# Patient Record
Sex: Female | Born: 1980 | Race: Black or African American | Hispanic: No | Marital: Single | Smoking: Former smoker
Health system: Southern US, Community
[De-identification: ages and names within clinical notes are randomized; demographics above are authoritative.]

## PROBLEM LIST (undated history)

## (undated) DIAGNOSIS — Z9889 Other specified postprocedural states: Secondary | ICD-10-CM

## (undated) DIAGNOSIS — A64 Unspecified sexually transmitted disease: Secondary | ICD-10-CM

## (undated) DIAGNOSIS — N62 Hypertrophy of breast: Secondary | ICD-10-CM

## (undated) DIAGNOSIS — I10 Essential (primary) hypertension: Secondary | ICD-10-CM

## (undated) DIAGNOSIS — R112 Nausea with vomiting, unspecified: Secondary | ICD-10-CM

## (undated) HISTORY — DX: Unspecified sexually transmitted disease: A64

## (undated) HISTORY — DX: Essential (primary) hypertension: I10

---

## 2000-06-20 DIAGNOSIS — A64 Unspecified sexually transmitted disease: Secondary | ICD-10-CM

## 2000-06-20 HISTORY — DX: Unspecified sexually transmitted disease: A64

## 2002-06-05 ENCOUNTER — Other Ambulatory Visit: Admission: RE | Admit: 2002-06-05 | Discharge: 2002-06-05 | Payer: Self-pay | Admitting: Gynecology

## 2003-07-02 ENCOUNTER — Other Ambulatory Visit: Admission: RE | Admit: 2003-07-02 | Discharge: 2003-07-02 | Payer: Self-pay | Admitting: Gynecology

## 2004-07-14 ENCOUNTER — Other Ambulatory Visit: Admission: RE | Admit: 2004-07-14 | Discharge: 2004-07-14 | Payer: Self-pay | Admitting: Gynecology

## 2005-07-15 ENCOUNTER — Other Ambulatory Visit: Admission: RE | Admit: 2005-07-15 | Discharge: 2005-07-15 | Payer: Self-pay | Admitting: Gynecology

## 2006-02-04 ENCOUNTER — Other Ambulatory Visit: Admission: RE | Admit: 2006-02-04 | Discharge: 2006-02-04 | Payer: Self-pay | Admitting: Obstetrics and Gynecology

## 2006-02-23 ENCOUNTER — Inpatient Hospital Stay (HOSPITAL_COMMUNITY): Admission: AD | Admit: 2006-02-23 | Discharge: 2006-02-24 | Payer: Self-pay | Admitting: Obstetrics and Gynecology

## 2006-07-16 ENCOUNTER — Inpatient Hospital Stay (HOSPITAL_COMMUNITY): Admission: AD | Admit: 2006-07-16 | Discharge: 2006-07-16 | Payer: Self-pay | Admitting: Obstetrics and Gynecology

## 2006-07-21 ENCOUNTER — Inpatient Hospital Stay (HOSPITAL_COMMUNITY): Admission: AD | Admit: 2006-07-21 | Discharge: 2006-07-21 | Payer: Self-pay | Admitting: Obstetrics and Gynecology

## 2006-07-28 ENCOUNTER — Inpatient Hospital Stay (HOSPITAL_COMMUNITY): Admission: AD | Admit: 2006-07-28 | Discharge: 2006-07-28 | Payer: Self-pay | Admitting: Obstetrics and Gynecology

## 2006-09-24 ENCOUNTER — Inpatient Hospital Stay (HOSPITAL_COMMUNITY): Admission: AD | Admit: 2006-09-24 | Discharge: 2006-09-28 | Payer: Self-pay | Admitting: Obstetrics and Gynecology

## 2006-09-25 ENCOUNTER — Encounter (INDEPENDENT_AMBULATORY_CARE_PROVIDER_SITE_OTHER): Payer: Self-pay | Admitting: *Deleted

## 2006-11-06 ENCOUNTER — Other Ambulatory Visit: Admission: RE | Admit: 2006-11-06 | Discharge: 2006-11-06 | Payer: Self-pay | Admitting: Obstetrics and Gynecology

## 2006-11-16 ENCOUNTER — Inpatient Hospital Stay (HOSPITAL_COMMUNITY): Admission: AD | Admit: 2006-11-16 | Discharge: 2006-11-16 | Payer: Self-pay | Admitting: Obstetrics and Gynecology

## 2007-02-05 ENCOUNTER — Inpatient Hospital Stay (HOSPITAL_COMMUNITY): Admission: AD | Admit: 2007-02-05 | Discharge: 2007-02-05 | Payer: Self-pay | Admitting: Obstetrics and Gynecology

## 2007-04-27 ENCOUNTER — Inpatient Hospital Stay (HOSPITAL_COMMUNITY): Admission: AD | Admit: 2007-04-27 | Discharge: 2007-04-27 | Payer: Self-pay | Admitting: Obstetrics and Gynecology

## 2007-05-29 ENCOUNTER — Ambulatory Visit: Payer: Self-pay | Admitting: Gastroenterology

## 2007-05-30 ENCOUNTER — Encounter: Admission: RE | Admit: 2007-05-30 | Discharge: 2007-05-30 | Payer: Self-pay | Admitting: Gastroenterology

## 2007-05-31 ENCOUNTER — Ambulatory Visit: Payer: Self-pay | Admitting: Gastroenterology

## 2007-05-31 DIAGNOSIS — K209 Esophagitis, unspecified without bleeding: Secondary | ICD-10-CM | POA: Insufficient documentation

## 2007-06-20 DIAGNOSIS — R109 Unspecified abdominal pain: Secondary | ICD-10-CM | POA: Insufficient documentation

## 2007-06-21 HISTORY — PX: CHOLECYSTECTOMY: SHX55

## 2007-07-27 ENCOUNTER — Inpatient Hospital Stay (HOSPITAL_COMMUNITY): Admission: AD | Admit: 2007-07-27 | Discharge: 2007-07-27 | Payer: Self-pay | Admitting: Obstetrics and Gynecology

## 2008-12-10 ENCOUNTER — Other Ambulatory Visit: Admission: RE | Admit: 2008-12-10 | Discharge: 2008-12-10 | Payer: Self-pay | Admitting: Gynecology

## 2008-12-10 ENCOUNTER — Ambulatory Visit: Payer: Self-pay | Admitting: Women's Health

## 2008-12-10 ENCOUNTER — Encounter: Payer: Self-pay | Admitting: Women's Health

## 2009-02-13 ENCOUNTER — Ambulatory Visit: Payer: Self-pay | Admitting: Obstetrics and Gynecology

## 2009-05-04 ENCOUNTER — Ambulatory Visit: Payer: Self-pay | Admitting: Women's Health

## 2009-07-31 ENCOUNTER — Ambulatory Visit: Payer: Self-pay | Admitting: Women's Health

## 2009-11-06 ENCOUNTER — Ambulatory Visit: Payer: Self-pay | Admitting: Women's Health

## 2010-06-20 HISTORY — PX: REDUCTION MAMMAPLASTY: SUR839

## 2010-09-17 ENCOUNTER — Encounter (INDEPENDENT_AMBULATORY_CARE_PROVIDER_SITE_OTHER): Payer: BC Managed Care – PPO | Admitting: Women's Health

## 2010-09-17 ENCOUNTER — Other Ambulatory Visit (HOSPITAL_COMMUNITY)
Admission: RE | Admit: 2010-09-17 | Discharge: 2010-09-17 | Disposition: A | Discharge status: Home / Self Care | Payer: BC Managed Care – PPO | Source: Ambulatory Visit | Attending: Gynecology | Admitting: Gynecology

## 2010-09-17 ENCOUNTER — Other Ambulatory Visit: Payer: Self-pay | Admitting: Women's Health

## 2010-09-17 DIAGNOSIS — R82998 Other abnormal findings in urine: Secondary | ICD-10-CM

## 2010-09-17 DIAGNOSIS — Z124 Encounter for screening for malignant neoplasm of cervix: Secondary | ICD-10-CM | POA: Insufficient documentation

## 2010-09-17 DIAGNOSIS — R635 Abnormal weight gain: Secondary | ICD-10-CM

## 2010-09-17 DIAGNOSIS — Z113 Encounter for screening for infections with a predominantly sexual mode of transmission: Secondary | ICD-10-CM

## 2010-09-17 DIAGNOSIS — Z01419 Encounter for gynecological examination (general) (routine) without abnormal findings: Secondary | ICD-10-CM

## 2010-09-17 DIAGNOSIS — B373 Candidiasis of vulva and vagina: Secondary | ICD-10-CM

## 2010-11-02 NOTE — Discharge Summary (Signed)
NAME:  Kelsey Cameron, BULGER NO.:  [id number]   MEDICAL RECORD NO.:  [id number]          PATIENT TYPE:  INP   LOCATION:  9125                          FACILITY:  [facility]   PHYSICIAN:  Rudy Jew. Ashley Royalty, M.D.DATE OF BIRTH:  [birth year]   DATE OF ADMISSION:  09/24/2006  DATE OF DISCHARGE:  09/28/2006                               DISCHARGE SUMMARY   DISCHARGE DIAGNOSES:  1. Intrauterine pregnancy at term, delivered.  2. Group B streptococcus carrier.  3. Meconium-stained amniotic fluid.  4. Arrest disorder of dilatation in labor.   OPERATIONS AND SPECIAL PROCEDURES:  Primary low transverse cesarean  section.   CONSULTATIONS:  None.   DISCHARGE MEDICATIONS:  1. Motrin 600 mg q.i.d.  2. Chromagen one daily.   HISTORY AND PHYSICAL:  This is a 25-year gravida 2, para 0, AB 1.  The  patient had the aforementioned risk factors.  She presented complaining  of possible rupture of membranes.  She was also noted to be having  contractions in maternity admissions unit.  For the remainder of the  past medical history please see [facility] forms.  Sterile speculum exam  revealed no evidence of ruptured membranes.  She was noted to be 1-2 cm  dilated, 80% effaced, -2 station, vertex presentation.  She was noted to  be having contractions.  For the remainder of the history and physical  please see chart.   HOSPITAL COURSE:  The patient was admitted to [facility]  [place].  Admission laboratory studies were drawn.  She went on to  labor.  On September 25, 2006, she was diagnosed as having an arrest disorder  of dilatation in labor.  She was hence taken to the operating room and  underwent primary low transverse cesarean section per Dr. Sylvester Harder.  The procedure was uncomplicated.  It yielded a 6-pound 15-  ounce female, Apgars 9 at one minute and 9 at five minutes.  The  patient's postoperative course was benign save for an asymptomatic  anemia.  She was discharged on  the third postoperative day afebrile and  in satisfactory condition.   DISPOSITION:  The patient is return to [facility] in 4 days.      James A. Ashley Royalty, M.D.  Electronically Signed     JAM/MEDQ  D:  11/01/2006  T:  11/01/2006  Job:  [id number]

## 2010-11-02 NOTE — Letter (Signed)
May 29, 2007    Dr. Charolett Bumpers  [facility]  [address]  [place], [place] [address]   RE:  Kelsey Cameron, Kelsey Cameron  MRN:  [id number]  /  DOB:  Jun 16, 1981   Dear Dr. Cleda Clarks:   Upon your kind referral, I had the pleasure of evaluating your patient  and I am pleased to offer my findings.  I saw Ms. Tatem in the office  today.  Enclosed is a copy of my progress note that details my findings  and recommendations.   Thank you for the opportunity to participate in your patient's care.    Sincerely,      Barbette Hair. Arlyce Dice, MD,FACG  Electronically Signed    RDK/MedQ  DD: 05/29/2007  DT: 05/29/2007  Job #: [id number]

## 2010-11-02 NOTE — Letter (Signed)
May 29, 2007    Kelsey Cameron   RE:  Kelsey Cameron, Kelsey Cameron  MRN:  [id number]  /  DOB:  June 12, 1981   Dear Ms. Kelsey Cameron:   It is my pleasure to have treated you recently as a new patient in my  office.  I appreciate your confidence and the opportunity to participate  in your care.   Since I do have a busy inpatient endoscopy schedule and office schedule,  my office hours vary weekly.  I am, however, available for emergency  calls every day through my office.  If I cannot promptly meet an urgent  office appointment, another one of our gastroenterologists will be able  to assist you.   My well-trained staff are prepared to help you at all times.  For  emergencies after office hours, a physician from our gastroenterology  section is always available through my 24-hour answering service.   While you are under my care, I encourage discussion of your questions  and concerns, and I will be happy to return your calls as soon as I am  available.   Once again, I welcome you as a new patient and I look forward to a happy  and healthy relationship.    Sincerely,      Barbette Hair. Arlyce Dice, MD,FACG  Electronically Signed   RDK/MedQ  DD: 05/29/2007  DT: 05/29/2007  Job #: [id number]

## 2010-11-02 NOTE — Assessment & Plan Note (Signed)
[facility]                         GASTROENTEROLOGY OFFICE NOTE   Kelsey Cameron, Kelsey Cameron                  MRN:          [id number]  DATE:05/29/2007                            DOB:          Dec 26, 1980    REASON FOR CONSULTATION:  Abdominal pain and chest burning.   HISTORY OF PRESENT ILLNESS:  Kelsey Cameron is a pleasant 30 year old  African American female who presents through the courtesy of Dr.  Cleda Clarks  for evaluation.  For the last four months, she has been complaining of  severe burning chest discomfort.  This often occurs postprandially.  She  has had regurgitation, gastric contents.  She also complains of pain and  pressure under her right ribs coincidental with the chest discomfort.  She is taking various PPIs without relief.  She is currently taking  Protonix.  She has had no gastric irritants including nonsteroidals.  She denies dysphagia or odynophagia.  Symptoms began after the birth of  her child in April 2008.  She denies cough, sore throat or hoarseness.   PAST MEDICAL HISTORY:  Unremarkable.   FAMILY HISTORY:  Noncontributory.   MEDICATIONS:  Protonix 40 mg daily.   ALLERGIES:  She has no allergies.   SOCIAL HISTORY:  She does not smoke.  She rarely drinks.  She is single.  Works in Clinical biochemist.   REVIEW OF SYSTEMS:  Positive for back pain and shortness of breath.   PHYSICAL EXAMINATION:  VITAL SIGNS:  Pulse 68, blood pressure 118/88,  weight 154.  HEENT: EOMI.  PERRLA.  Sclerae are anicteric.  Conjunctivae are pink.  NECK:  Supple without thyromegaly, adenopathy or carotid bruits.  CHEST:  Clear to auscultation and percussion without adventitious  sounds.  CARDIAC:  Regular rhythm; normal S1 S2.  There are no murmurs, gallops  or rubs.  ABDOMEN:  She has mild tenderness in the mid epigastrium without  guarding or rebound.  There are no abdominal masses or organomegaly.  EXTREMITIES:  Full range of motion.  No  cyanosis, clubbing or edema.  RECTAL:  Deferred.   IMPRESSION:  Persistent chest burning with regurgitation and upper  abdominal pain.  Symptoms certainly compatible with gastroesophageal  reflux, though atypical in the absence of response to PPR therapy.  Chronic cholecystitis is also a consideration.   RECOMMENDATION:  1. Abdominal ultrasound.  2. The patient will consider enrollment in a gastroesophageal reflux      disease trial.  3. Antireflux measures were explained to the patient.     Barbette Hair. Arlyce Dice, MD,FACG  Electronically Signed    RDK/MedQ  DD: 05/29/2007  DT: 05/29/2007  Job #: [id number]   cc:   [facility]pt [facility] Dr .Charolett Bumpers

## 2010-11-05 NOTE — Op Note (Signed)
NAME:  Kelsey, Cameron NO.:  [id number]   MEDICAL RECORD NO.:  [id number]          PATIENT TYPE:  INP   LOCATION:  9125                          [facility]ILITY:  [facility]   PHYSICIAN:  Rudy Jew. Ashley Royalty, M.D.DATE OF BIRTH:  Sep 10, 1980   DATE OF PROCEDURE:  09/25/2006  DATE OF DISCHARGE:                               OPERATIVE REPORT   PREOPERATIVE DIAGNOSIS:  1. Intrauterine pregnancy at 40 weeks 5 days gestation.  2. Meconium-stained amniotic fluid.  3. Arrest disorder of dilatation in labor.   POSTOPERATIVE DIAGNOSIS:  1. Intrauterine pregnancy at 40 weeks 5 days gestation.  2. Meconium-stained amniotic fluid.  3. Arrest disorder of dilatation in labor.   PROCEDURE:  Primary low transverse cesarean section.   SURGEON:  Rudy Jew. Ashley Royalty, M.D.   ANESTHESIA:  Epidural.   FINDINGS:  6 pounds 15 ounce female, Apgars 9 at one minute and 9 at  five minutes sent to newborn nursery.   ESTIMATED BLOOD LOSS:  800 mL.   COMPLICATIONS:  None.   PACKS DRAINS:  Foley.   Sponge, needle, and instrument count reported as correct x2.   PROCEDURE:  The patient is taken to the operating room, placed in the  dorsal supine position.  Epidural anesthetic was dosed to a surgical  level.  She was prepped and draped in usual manner for abdominal  surgery.  Foley catheter had been previously placed.   A Pfannenstiel incision was made down to level of the fascia which was  nicked with a knife and incised transversely with Mayo scissors.  The  underlying rectus muscles were separated from the fascia using sharp and  blunt dissection.  The rectus muscles were separated in midline exposing  the peritoneum which was elevated with hemostats and entered  atraumatically with Metzenbaum scissors.  The incision was extended  longitudinally.  Uterus was identified.  A bladder flap created by  incising anterior uterine serosa and sharply and bluntly dissecting the  bladder inferiorly.  It was  held in place with a bladder blade.  The  uterus was then entered through a low transverse incision using sharp  and blunt dissection.  The fluid was noted to be meconium stained.  The  infant was delivered from vertex presentation.  At delivery of the head,  DeLee suction was employed.  Full delivery was then accomplished and the  cord clamped, cut and the infant given immediately to the awaiting  pediatrics team.  Cord blood was obtained and placenta and membranes  were removed in their entirety and submitted to pathology for histologic  studies.  Uterus was exteriorized.  The uterus was then closed in two  running layers of #1 Vicryl.  The first was a running locking layer.  The second was a running, intermittently locking, and imbricating layer.  One additional figure-of-eight suture was required on the left side of  the incision to obtain hemostasis.  Hemostasis was noted.  Uterus, tubes  and ovaries were inspected and found to be normal save for a 1 cm  fibroid on the anterior surface fundally.  There were returned to the  abdominal cavity.  Copious irrigation was accomplished.  Hemostasis was  noted.  The peritoneum was then closed with 3-0 Vicryl in a running  fashion.  The fascia was closed 0 Vicryl in a running fashion.  The skin  was closed with staples.   The patient tolerated the procedure extremely well and was returned to  the recovery room in good condition.      James A. Ashley Royalty, M.D.  Electronically Signed     JAM/MEDQ  D:  09/25/2006  T:  09/25/2006  Job:  [id number]

## 2011-05-10 ENCOUNTER — Encounter (HOSPITAL_BASED_OUTPATIENT_CLINIC_OR_DEPARTMENT_OTHER): Payer: Self-pay | Admitting: *Deleted

## 2011-05-17 ENCOUNTER — Encounter (HOSPITAL_BASED_OUTPATIENT_CLINIC_OR_DEPARTMENT_OTHER)
Admission: RE | Disposition: A | Discharge status: Home / Self Care | Payer: Self-pay | Source: Ambulatory Visit | Attending: Plastic Surgery

## 2011-05-17 ENCOUNTER — Encounter (HOSPITAL_BASED_OUTPATIENT_CLINIC_OR_DEPARTMENT_OTHER): Payer: Self-pay | Admitting: Anesthesiology

## 2011-05-17 ENCOUNTER — Ambulatory Visit (HOSPITAL_BASED_OUTPATIENT_CLINIC_OR_DEPARTMENT_OTHER)
Admission: RE | Admit: 2011-05-17 | Discharge: 2011-05-17 | Disposition: A | Discharge status: Home / Self Care | Payer: BC Managed Care – PPO | Source: Ambulatory Visit | Attending: Plastic Surgery | Admitting: Plastic Surgery

## 2011-05-17 ENCOUNTER — Encounter (HOSPITAL_BASED_OUTPATIENT_CLINIC_OR_DEPARTMENT_OTHER): Payer: Self-pay | Admitting: *Deleted

## 2011-05-17 ENCOUNTER — Ambulatory Visit (HOSPITAL_BASED_OUTPATIENT_CLINIC_OR_DEPARTMENT_OTHER): Payer: BC Managed Care – PPO | Admitting: Anesthesiology

## 2011-05-17 ENCOUNTER — Other Ambulatory Visit: Payer: Self-pay | Admitting: Plastic Surgery

## 2011-05-17 DIAGNOSIS — N62 Hypertrophy of breast: Secondary | ICD-10-CM | POA: Diagnosis present

## 2011-05-17 HISTORY — PX: BREAST REDUCTION SURGERY: SHX8

## 2011-05-17 HISTORY — DX: Other specified postprocedural states: Z98.890

## 2011-05-17 HISTORY — DX: Hypertrophy of breast: N62

## 2011-05-17 HISTORY — DX: Other specified postprocedural states: R11.2

## 2011-05-17 SURGERY — MAMMOPLASTY, REDUCTION
Anesthesia: General | Site: Breast | Laterality: Bilateral | Wound class: Clean

## 2011-05-17 MED ORDER — CEFAZOLIN SODIUM 1-5 GM-% IV SOLN
INTRAVENOUS | Status: DC | PRN
  Administered 2011-05-17: 2 g via INTRAVENOUS

## 2011-05-17 MED ORDER — PROMETHAZINE HCL 25 MG PO TABS
25.0000 mg | ORAL_TABLET | Freq: Once | ORAL | Status: AC
  Administered 2011-05-17: 25 mg via ORAL

## 2011-05-17 MED ORDER — BUPIVACAINE-EPINEPHRINE 0.5% -1:200000 IJ SOLN
INTRAMUSCULAR | Status: DC | PRN
  Administered 2011-05-17 (×2): 20 mL

## 2011-05-17 MED ORDER — FENTANYL CITRATE 0.05 MG/ML IJ SOLN
INTRAMUSCULAR | Status: DC | PRN
  Administered 2011-05-17 (×4): 100 ug via INTRAVENOUS

## 2011-05-17 MED ORDER — LIDOCAINE-EPINEPHRINE 1 %-1:100000 IJ SOLN
INTRAMUSCULAR | Status: DC | PRN
  Administered 2011-05-17: 40 mL

## 2011-05-17 MED ORDER — MEPERIDINE HCL 25 MG/ML IJ SOLN: 6.2500 mg | INTRAMUSCULAR | Status: DC | PRN

## 2011-05-17 MED ORDER — DEXAMETHASONE SODIUM PHOSPHATE 4 MG/ML IJ SOLN
INTRAMUSCULAR | Status: DC | PRN
  Administered 2011-05-17: 10 mg via INTRAVENOUS

## 2011-05-17 MED ORDER — CEFAZOLIN SODIUM 1-5 GM-% IV SOLN: 1.0000 g | Freq: Once | INTRAVENOUS | Status: DC

## 2011-05-17 MED ORDER — CISATRACURIUM BESYLATE 2 MG/ML IV SOLN
INTRAVENOUS | Status: DC | PRN
  Administered 2011-05-17: 14 mg via INTRAVENOUS

## 2011-05-17 MED ORDER — LACTATED RINGERS IV SOLN
INTRAVENOUS | Status: DC
  Administered 2011-05-17 (×4): via INTRAVENOUS

## 2011-05-17 MED ORDER — ONDANSETRON HCL 4 MG/2ML IJ SOLN
4.0000 mg | Freq: Once | INTRAMUSCULAR | Status: AC | PRN
  Administered 2011-05-17: 4 mg via INTRAVENOUS

## 2011-05-17 MED ORDER — HYDROMORPHONE HCL PF 1 MG/ML IJ SOLN: 0.2500 mg | INTRAMUSCULAR | Status: DC | PRN

## 2011-05-17 MED ORDER — LIDOCAINE HCL (CARDIAC) 20 MG/ML IV SOLN
INTRAVENOUS | Status: DC | PRN
  Administered 2011-05-17: 100 mg via INTRAVENOUS

## 2011-05-17 MED ORDER — DROPERIDOL 2.5 MG/ML IJ SOLN
INTRAMUSCULAR | Status: DC | PRN
  Administered 2011-05-17: 0.625 mg via INTRAVENOUS

## 2011-05-17 MED ORDER — BACITRACIN ZINC 500 UNIT/GM EX OINT
TOPICAL_OINTMENT | CUTANEOUS | Status: DC | PRN
  Administered 2011-05-17: 2 via TOPICAL

## 2011-05-17 MED ORDER — PROPOFOL 10 MG/ML IV EMUL
INTRAVENOUS | Status: DC | PRN
  Administered 2011-05-17: 140 mg via INTRAVENOUS
  Administered 2011-05-17: 50 mg via INTRAVENOUS

## 2011-05-17 MED ORDER — MIDAZOLAM HCL 5 MG/5ML IJ SOLN
INTRAMUSCULAR | Status: DC | PRN
  Administered 2011-05-17: 2 mg via INTRAVENOUS

## 2011-05-17 MED ORDER — ONDANSETRON HCL 4 MG/2ML IJ SOLN
INTRAMUSCULAR | Status: DC | PRN
  Administered 2011-05-17: 4 mg via INTRAVENOUS

## 2011-05-17 SURGICAL SUPPLY — 56 items
BANDAGE GAUZE ELAST BULKY 4 IN (GAUZE/BANDAGES/DRESSINGS) ×4 IMPLANT
BENZOIN TINCTURE PRP APPL 2/3 (GAUZE/BANDAGES/DRESSINGS) ×2 IMPLANT
BLADE KNIFE PERSONA 10 (BLADE) ×8 IMPLANT
BLADE KNIFE PERSONA 15 (BLADE) ×6 IMPLANT
CANISTER SUCTION 1200CC (MISCELLANEOUS) ×4 IMPLANT
CAP BOUFFANT 24 BLUE NURSES (PROTECTIVE WEAR) ×2 IMPLANT
CLOTH BEACON ORANGE TIMEOUT ST (SAFETY) ×2 IMPLANT
COVER MAYO STAND STRL (DRAPES) ×2 IMPLANT
COVER TABLE BACK 60X90 (DRAPES) ×2 IMPLANT
DECANTER SPIKE VIAL GLASS SM (MISCELLANEOUS) ×4 IMPLANT
DRAIN CHANNEL 10F 3/8 F FF (DRAIN) ×4 IMPLANT
DRAPE LAPAROSCOPIC ABDOMINAL (DRAPES) ×2 IMPLANT
DRSG EMULSION OIL 3X3 NADH (GAUZE/BANDAGES/DRESSINGS) ×4 IMPLANT
DRSG PAD ABDOMINAL 8X10 ST (GAUZE/BANDAGES/DRESSINGS) ×2 IMPLANT
ELECT NEEDLE TIP 2.8 STRL (NEEDLE) IMPLANT
ELECT REM PT RETURN 9FT ADLT (ELECTROSURGICAL) ×2
ELECTRODE REM PT RTRN 9FT ADLT (ELECTROSURGICAL) ×1 IMPLANT
EVACUATOR SILICONE 100CC (DRAIN) ×4 IMPLANT
FILTER 7/8 IN (FILTER) ×2 IMPLANT
GLOVE BIO SURGEON STRL SZ 6.5 (GLOVE) ×2 IMPLANT
GLOVE BIOGEL PI IND STRL 6.5 (GLOVE) IMPLANT
GLOVE BIOGEL PI INDICATOR 6.5 (GLOVE)
GLOVE ECLIPSE 6.5 STRL STRAW (GLOVE) ×12 IMPLANT
GOWN PREVENTION PLUS XLARGE (GOWN DISPOSABLE) ×8 IMPLANT
GOWN PREVENTION PLUS XXLARGE (GOWN DISPOSABLE) IMPLANT
NEEDLE HYPO 25X1 1.5 SAFETY (NEEDLE) ×4 IMPLANT
NEEDLE SPNL 18GX3.5 QUINCKE PK (NEEDLE) ×2 IMPLANT
NS IRRIG 1000ML POUR BTL (IV SOLUTION) ×4 IMPLANT
PACK BASIN DAY SURGERY FS (CUSTOM PROCEDURE TRAY) ×2 IMPLANT
PIN SAFETY STERILE (MISCELLANEOUS) ×2 IMPLANT
SCRUB PCMX 4 OZ (MISCELLANEOUS) ×2 IMPLANT
SCRUB TECHNI CARE SURGICAL (MISCELLANEOUS) IMPLANT
SLEEVE SCD COMPRESS KNEE MED (MISCELLANEOUS) ×2 IMPLANT
SPECIMEN JAR MEDIUM (MISCELLANEOUS) ×4 IMPLANT
SPECIMEN JAR X LARGE (MISCELLANEOUS) IMPLANT
SPONGE GAUZE 4X4 12PLY (GAUZE/BANDAGES/DRESSINGS) ×2 IMPLANT
SPONGE LAP 18X18 X RAY DECT (DISPOSABLE) ×6 IMPLANT
STAPLER VISISTAT 35W (STAPLE) ×4 IMPLANT
STRIP CLOSURE SKIN 1/2X4 (GAUZE/BANDAGES/DRESSINGS) ×8 IMPLANT
SUT ETHILON 3 0 PS 1 (SUTURE) ×2 IMPLANT
SUT MNCRL AB 3-0 PS2 18 (SUTURE) ×8 IMPLANT
SUT MNCRL AB 4-0 PS2 18 (SUTURE) ×4 IMPLANT
SUT MON AB 5-0 PS2 18 (SUTURE) ×4 IMPLANT
SUT PROLENE 2 0 CT2 30 (SUTURE) ×2 IMPLANT
SUT PROLENE 3 0 PS 1 (SUTURE) ×4 IMPLANT
SUT QUILL PDO 2-0 (SUTURE) ×4 IMPLANT
SYR BULB IRRIGATION 50ML (SYRINGE) ×4 IMPLANT
SYR CONTROL 10ML LL (SYRINGE) ×4 IMPLANT
TOWEL OR 17X24 6PK STRL BLUE (TOWEL DISPOSABLE) ×8 IMPLANT
TRAY DSU PREP LF (CUSTOM PROCEDURE TRAY) ×4 IMPLANT
TRAY FOLEY CATH 14FR (SET/KITS/TRAYS/PACK) ×2 IMPLANT
TUBE CONNECTING 20X1/4 (TUBING) ×2 IMPLANT
UNDERPAD 30X30 INCONTINENT (UNDERPADS AND DIAPERS) ×2 IMPLANT
VAC PENCILS W/TUBING CLEAR (MISCELLANEOUS) ×2 IMPLANT
WATER STERILE IRR 1000ML POUR (IV SOLUTION) IMPLANT
YANKAUER SUCT BULB TIP NO VENT (SUCTIONS) ×2 IMPLANT

## 2011-05-17 NOTE — Anesthesia Procedure Notes (Addendum)
Performed by: Signa Kell   Procedure Name: Intubation Date/Time: 05/17/2011 8:04 AM Performed by: Signa Kell Pre-anesthesia Checklist: Timeout performed Patient Re-evaluated:Patient Re-evaluated prior to inductionOxygen Delivery Method: Circle System Utilized Preoxygenation: Pre-oxygenation with 100% oxygen Intubation Type: IV induction Ventilation: Mask ventilation without difficulty Laryngoscope Size: Mac and 3 Tube type: Oral Tube size: 7.0 mm Number of attempts: 1 Airway Equipment and Method: stylet and oral airway Placement Confirmation: ETT inserted through vocal cords under direct vision,  positive ETCO2 and breath sounds checked- equal and bilateral Secured at: 20 cm Tube secured with: Tape Dental Injury: Teeth and Oropharynx as per pre-operative assessment

## 2011-05-17 NOTE — Transfer of Care (Signed)
Immediate Anesthesia Transfer of Care Note  Patient: Kelsey Cameron  Procedure(s) Performed:  MAMMARY REDUCTION BILATERAL (BREAST)  Patient Location: PACU  Anesthesia Type: General  Level of Consciousness: sedated and patient cooperative  Airway & Oxygen Therapy: Patient Spontanous Breathing and Patient connected to face mask oxygen  Post-op Assessment: Report given to PACU RN and Post -op Vital signs reviewed and stable  Post vital signs: Reviewed and stable  Complications: No apparent anesthesia complications

## 2011-05-17 NOTE — H&P (Signed)
H&P documentation: 05/10/2011  -History and Physical Reviewed  -Patient has been re-examined  -No change in the plan of care  Kelsey Cameron A

## 2011-05-17 NOTE — Anesthesia Postprocedure Evaluation (Signed)
  Anesthesia Post-op Note  Patient: Kelsey Cameron  Procedure(s) Performed:  MAMMARY REDUCTION BILATERAL (BREAST)  Patient Location: PACU  Anesthesia Type: General  Level of Consciousness: sedated  Airway and Oxygen Therapy: Patient Spontanous Breathing  Post-op Pain: none  Post-op Assessment: Post-op Vital signs reviewed  Post-op Vital Signs: stable  Complications: No apparent anesthesia complications

## 2011-05-17 NOTE — Anesthesia Preprocedure Evaluation (Signed)
Anesthesia Evaluation  Patient identified by MRN, date of birth, ID band Patient awake    Reviewed: Allergy & Precautions, H&P , Patient's Chart, lab work & pertinent test results  Airway       Dental   Pulmonary          Cardiovascular     Neuro/Psych    GI/Hepatic   Endo/Other    Renal/GU      Musculoskeletal   Abdominal   Peds  Hematology   Anesthesia Other Findings   Reproductive/Obstetrics                           Anesthesia Physical Anesthesia Plan  ASA: II  Anesthesia Plan: General   Post-op Pain Management:    Induction:   Airway Management Planned:   Additional Equipment:   Intra-op Plan:   Post-operative Plan:   Informed Consent: I have reviewed the patients History and Physical, chart, labs and discussed the procedure including the risks, benefits and alternatives for the proposed anesthesia with the patient or authorized representative who has indicated his/her understanding and acceptance.     Plan Discussed with: CRNA and Surgeon  Anesthesia Plan Comments:         Anesthesia Quick Evaluation  

## 2011-05-17 NOTE — Brief Op Note (Signed)
05/17/2011  11:16 AM  PATIENT:  Kelsey Cameron  30 y.o. female  PRE-OPERATIVE DIAGNOSIS: Bilateral macromastia  POST-OPERATIVE DIAGNOSIS:  Bilateral Macromastia  PROCEDURE:  Procedure(s): MAMMARY REDUCTION BILATERAL (BREAST)  SURGEON:  Surgeon(s): Mary A Contogiannis  ASSISTANTS: None   ANESTHESIA:   general  EBL:  Total I/O In: 2000 [I.V.:2000] Out: 900 [Urine:700; Blood:200]  DRAINS: (75F) Jackson-Pratt drain(s) with closed bulb suction in the breasts   LOCAL MEDICATIONS USED:  MARCAINE 40CC  SPECIMEN:  Source of Specimen:  Bilateral breast tissue  DISPOSITION OF SPECIMEN:  PATHOLOGY  COUNTS:  YES  DICTATION: .Other Dictation: Dictation Number [id number]  PLAN OF CARE: Discharge to home after PACU  PATIENT DISPOSITION:  PACU - hemodynamically stable.   Delay start of Pharmacological VTE agent (>24hrs) due to surgical blood loss or risk of bleeding:  NOT APPLICABLE:[id number]

## 2011-05-18 NOTE — Op Note (Signed)
NAME:  Kelsey Cameron, Kelsey Cameron NO.:  [id number]  MEDICAL RECORD NO.:  [id number]  LOCATION:                                 FACILITY:  PHYSICIAN:  Brantley Persons, M.D.DATE OF BIRTH:  04-03-1981  DATE OF PROCEDURE:  05/17/2011 DATE OF DISCHARGE:                              OPERATIVE REPORT   PREOPERATIVE DIAGNOSIS:  Bilateral macromastia.  POSTOPERATIVE DIAGNOSIS:  Bilateral macromastia.  PROCEDURE:  Bilateral reduction mammoplasties.  ATTENDING SURGEON:  Brantley Persons, M.D.  ANESTHESIA:  General.  ANESTHESIOLOGIST:  Burna Forts, M.D.  FLUID REPLACEMENT:  2500 mL crystalloid.  Urine output 700 mL.  ESTIMATED BLOOD LOSS:  200 mL.  COMPLICATIONS:  None.  INDICATIONS FOR THE PROCEDURE:  The patient is a 30 year old African American female, who has bilateral macromastia that is clinically symptomatic.  She presents to undergo bilateral reduction mammoplasties.  PROCEDURE:  The patient was marked in preop holding area in pattern of Wise for the future bilateral reduction mammoplasties.  She was then taken the OR, placed on table in supine position.  After adequate general anesthesia was obtained, the patient's chest was prepped with Techni-Care and draped in sterile fashion.  The bases of the breasts had been injected 1% lidocaine with epinephrine.  After adequate hemostasis and anesthesia taken effect, the procedure was begun.  Both of the breast reductions were performed in the following similar manner.  The nipple-areolar complexes were marked at 45 mm nipple marker.  The skin was then incised and de-epithelialized around the nipple-areolar complex then down to the inframammary crease in the inferior pedicle pattern.  The medial, superior, and lateral skin flaps were then elevated down to the chest wall.  The excess fat and glandular tissue removed from the inferior pedicle.  The nipple-areolar complex was examined and found to be pink and  viable.  The wound was irrigated with saline irrigation followed by meticulous hemostasis with the Bovie electrocautery.  The inferior pedicle was then centralized using 3-0 Prolene suture.  The pectoral muscle fascia and medial lateral soft tissues were infiltrated with 0.5% Marcaine with epinephrine to provide a good postsurgical anesthetic block.  The rest of the Marcaine will be injected at the end of the procedure.  A #10 JP flat fully fluted drain was placed into the wound.  The skin flaps brought together at the inverted T junction with a 2-0 Prolene suture.  The incisions were stapled for temporary closure.  The breasts compared and found to have good shape and symmetry.  The incision was then closed in the medial aspect of JP drain to the medial aspect of the [facility] incision by first placing a few 3-0 Monocryl sutures to loosely tack together the dermal layer and then both of the dermal cuticular layers were closed in a single layer using a 2-0 Quill PDO barbed suture.  Lateral to the JP drain, incision was closed using 3-0 Monocryl dermal layer followed by 3- 0 Monocryl running intracuticular stitch on the skin.  The patient was placed in the upright position.  The future location of the nipple-areolar complexes was marked on both breast mounds using the 45 mm nipple marker.  She was then placed back in  the recumbent position.  Both of the nipple-areolar complexes were brought out onto the breast mounds in the following similar manner.  The skin was incised as marked and removed full thickness into the soft tissues.  The nipple-areolar complex was examined, found to be pink and viable, then brought out through this aperture and sewn in place using 4-0 Monocryl on the dermal layer followed by a 5-0 Monocryl running intracuticular stitch on skin. The vertical limb of the Wise pattern were closed in the dermal layer using 3-0 Monocryl suture.  The cuticular layer was then closed  with the 5-0 Monocryl suture in continuity from the nipple-areolar closure.  The JP drain was sewn in place using 3-0 nylon suture.  The skin and soft tissues in the area of the incisions were then infiltrated with 0.5% Marcaine with epinephrine to provide a postsurgical anesthetic block. The incisions were dressed in benzoin, Steri-Strips, and the nipples additionally bacitracin ointment and Adaptic.  The 4x4s were placed over the incisions and ABD pads in the axillary areas.  Additionally, bacitracin ointment and Adaptic were placed over the nipple-areolar complexes.  The patient was placed into light postoperative support bra. There were no complications.  The patient tolerated procedure well.  The final needle, sponge counts reported to be correct at the end the case.  The patient was then extubated and taken to recovery room in stable condition.  She also recovered without complications.  The patient and her friend were given proper postoperative wound care instructions including care of the JP drains.  She was then discharged home in the care of her friend in stable condition.  Follow up appointment will be Friday in the office.          ______________________________ Brantley Persons, M.D.     MC/MEDQ  D:  05/17/2011  T:  05/17/2011  Job:  [id number]

## 2011-05-19 ENCOUNTER — Encounter (HOSPITAL_BASED_OUTPATIENT_CLINIC_OR_DEPARTMENT_OTHER): Payer: Self-pay | Admitting: Plastic Surgery

## 2011-10-20 ENCOUNTER — Ambulatory Visit (INDEPENDENT_AMBULATORY_CARE_PROVIDER_SITE_OTHER): Payer: BC Managed Care – PPO | Admitting: Women's Health

## 2011-10-20 ENCOUNTER — Encounter: Payer: Self-pay | Admitting: Women's Health

## 2011-10-20 ENCOUNTER — Other Ambulatory Visit (HOSPITAL_COMMUNITY)
Admission: RE | Admit: 2011-10-20 | Discharge: 2011-10-20 | Disposition: A | Discharge status: Home / Self Care | Payer: BC Managed Care – PPO | Source: Ambulatory Visit | Attending: Women's Health | Admitting: Women's Health

## 2011-10-20 VITALS — BP 138/90 | Ht 65.0 in | Wt 188.0 lb

## 2011-10-20 DIAGNOSIS — E079 Disorder of thyroid, unspecified: Secondary | ICD-10-CM

## 2011-10-20 DIAGNOSIS — Z01419 Encounter for gynecological examination (general) (routine) without abnormal findings: Secondary | ICD-10-CM

## 2011-10-20 DIAGNOSIS — Z113 Encounter for screening for infections with a predominantly sexual mode of transmission: Secondary | ICD-10-CM | POA: Insufficient documentation

## 2011-10-20 DIAGNOSIS — Z13220 Encounter for screening for lipoid disorders: Secondary | ICD-10-CM

## 2011-10-20 DIAGNOSIS — Z833 Family history of diabetes mellitus: Secondary | ICD-10-CM

## 2011-10-20 NOTE — Progress Notes (Signed)
DIAN LAPRADE 1980-10-07 [id number]    History:    The patient presents for annual exam.  Monthly 4-5 day cycle/condoms/same partner. History of normal Paps, last Pap lacked endocervical cells. Frustrated with weight, has been on depo provera and implanon in the past, had weight gain on both and having difficulty loosing wt.   Past medical history, past surgical history, family history and social history were all reviewed and documented in the EPIC chart. Daughter Festus Barren, 5 doing well.   ROS:  A  ROS was performed and pertinent positives and negatives are included in the history.  Exam:  Filed Vitals:   10/20/11 1421  BP: 138/90    General appearance:  Normal Head/Neck:  Normal, without cervical or supraclavicular adenopathy. Thyroid:  Symmetrical, normal in size, without palpable masses or nodularity. Respiratory  Effort:  Normal  Auscultation:  Clear without wheezing or rhonchi Cardiovascular  Auscultation:  Regular rate, without rubs, murmurs or gallops  Edema/varicosities:  Not grossly evident Abdominal  Soft,nontender, without masses, guarding or rebound.  Liver/spleen:  No organomegaly noted  Hernia:  None appreciated  Skin  Inspection:  Grossly normal  Palpation:  Grossly normal Neurologic/psychiatric  Orientation:  Normal with appropriate conversation.  Mood/affect:  Normal  Genitourinary    Breasts: Examined lying and sitting/had breast reduction surgery 2012    Right: Without masses, retractions, discharge or axillary adenopathy.     Left: Without masses, retractions, discharge or axillary adenopathy.   Inguinal/mons:  Normal without inguinal adenopathy  External genitalia:  Normal  BUS/Urethra/Skene's glands:  Normal  Bladder:  Normal  Vagina:  Normal  Cervix:  Normal  Uterus:  normal in size, shape and contour.  Midline and mobile  Adnexa/parametria:     Rt: Without masses or tenderness.   Lt: Without masses or tenderness.  Anus and  perineum: Normal  Digital rectal exam: Normal sphincter tone without palpated masses or tenderness  Assessment/Plan:  31 y.o. SBF G2 P1  for annual exam with no complaints, requesting STD screening.  Normal GYN exam STD screening Weight gain  Plan: Continue exercise, cutting calories, Weight Watchers encouraged for weight loss. SBE's, MVI daily encouraged. Declined other contraception other than condoms, plan B emergency contraception discussed. CBC, glucose, lipid panel, TSH, UA, Pap, GC/Chlamydia, HIV, hepatitis B, C., RPRHarrington Challenger WHNP, 3:39 PM 10/20/2011

## 2011-10-20 NOTE — Patient Instructions (Signed)

## 2011-10-21 LAB — CBC WITH DIFFERENTIAL/PLATELET
Basophils Absolute: 0 10*3/uL (ref 0.0–0.1)
Eosinophils Absolute: 0.3 10*3/uL (ref 0.0–0.7)
Eosinophils Relative: 4 % (ref 0–5)
Lymphocytes Relative: 32 % (ref 12–46)
Lymphs Abs: 3 10*3/uL (ref 0.7–4.0)
MCH: 30.5 pg (ref 26.0–34.0)
MCV: 93.4 fL (ref 78.0–100.0)
Neutrophils Relative %: 59 % (ref 43–77)
Platelets: 198 10*3/uL (ref 150–400)
RBC: 4.1 MIL/uL (ref 3.87–5.11)
RDW: 14.1 % (ref 11.5–15.5)
WBC: 9.1 10*3/uL (ref 4.0–10.5)

## 2011-10-21 LAB — GLUCOSE, RANDOM: Glucose, Bld: 71 mg/dL (ref 70–99)

## 2011-10-21 LAB — URINALYSIS W MICROSCOPIC + REFLEX CULTURE
Casts: NONE SEEN
Crystals: NONE SEEN
Glucose, UA: NEGATIVE mg/dL
Leukocytes, UA: NEGATIVE
Nitrite: NEGATIVE
Specific Gravity, Urine: 1.026 (ref 1.005–1.030)
pH: 6 (ref 5.0–8.0)

## 2011-10-21 LAB — LIPID PANEL
HDL: 43 mg/dL (ref >39)
LDL Cholesterol: 70 mg/dL (ref 0–99)

## 2011-10-21 LAB — RPR

## 2011-10-21 LAB — TSH: TSH: 0.781 u[IU]/mL (ref 0.350–4.500)

## 2011-11-15 ENCOUNTER — Telehealth: Payer: Self-pay | Admitting: *Deleted

## 2011-11-15 NOTE — Telephone Encounter (Signed)
Pt called requesting name of PCP number for [facility] physicians family medicine. Left number on pt voicemail.

## 2011-11-17 ENCOUNTER — Ambulatory Visit (INDEPENDENT_AMBULATORY_CARE_PROVIDER_SITE_OTHER): Payer: BC Managed Care – PPO | Admitting: Internal Medicine

## 2011-11-17 ENCOUNTER — Encounter: Payer: Self-pay | Admitting: Internal Medicine

## 2011-11-17 VITALS — BP 140/94 | HR 72 | Temp 99.3°F | Ht 64.5 in | Wt 188.0 lb

## 2011-11-17 DIAGNOSIS — I10 Essential (primary) hypertension: Secondary | ICD-10-CM | POA: Insufficient documentation

## 2011-11-17 MED ORDER — HYDROCHLOROTHIAZIDE 12.5 MG PO CAPS: 12.5000 mg | ORAL_CAPSULE | Freq: Every day | ORAL | Status: DC

## 2011-11-17 NOTE — Patient Instructions (Signed)
Please complete the following lab tests before your next follow up appointment: BMET - 401.9 Increase your intake of high K foods Limit your sodium intake to 2.5 grams per day  [email or web address] Continue your regular aerobic exercise program

## 2011-11-17 NOTE — Assessment & Plan Note (Signed)
31 year old [place] American female with stage I hypertension. Reviewed following a low sodium diet. I suggested patient review educational material on dashdiet. She is to continue regular aerobic exercise. Start hydrochlorothiazide 12.5 mg once daily. Patient encouraged to increase foods high in potassium. Reassess in 6 weeks. BMET before next office visit.

## 2011-11-17 NOTE — Progress Notes (Signed)
Subjective:    Patient ID: Kelsey Cameron, female    DOB: March 15, 1981, 31 y.o.   MRN: [id number]  HPI  31 year old African American female to establish. She was referred to [place] by her gynecologist. Patient reports history of pre-hypertension over the past one year. Her blood pressure has been rising slightly over the past 6 months. She has strong family history of high blood pressure. She is not taking any over-the-counter supplements that would raise her blood pressure. She has no associated headache or chest pain.  She also reports history of gastroesophageal reflux disease. This has completely resolved with dietary changes. She also has history of migraine headaches. Fortunately migraine headaches went away after having breast reduction surgery.  Patient reports previous blood work with her gynecologist. Her lipid panel is reported normal as well as her thyroid function tests.  Review of Systems   Constitutional: Negative for activity change, appetite change.  Approximately 33 lbs weight gain over 5 yrs (childbirth)  Eyes: Negative for visual disturbance.  Respiratory: Negative for cough, chest tightness and shortness of breath.   Cardiovascular: Negative for chest pain.  Genitourinary: Negative for difficulty urinating.  Neurological: Negative for headaches.  Gastrointestinal: Negative for abdominal pain, heartburn melena or hematochezia Psych: Negative for depression or anxiety      Past Medical History  Diagnosis Date  . PONV (postoperative nausea and vomiting)   . Macromastia   . STD (sexually transmitted disease) 2002    [place]    History   Social History  . Marital Status: Single    Spouse Name: N/A    Number of Children: N/A  . Years of Education: N/A   Occupational History  . Not on file.   Social History Main Topics  . Smoking status: Former Smoker    Types: Cigarettes  . Smokeless tobacco: Never Used   Comment: quit smoking 10/2010  . Alcohol Use:  Yes     1 drink/month  . Drug Use: No  . Sexually Active: Yes    Birth Control/ Protection: Condom   Other Topics Concern  . Not on file   Social History Narrative  . No narrative on file    Past Surgical History  Procedure Date  . Cholecystectomy 2009  . Breast reduction surgery 05/17/2011    Procedure: MAMMARY REDUCTION BILATERAL (BREAST);  Surgeon: Mary A Contogiannis;  Location: [facility];  Service: Plastics;  Laterality: Bilateral;  . Cesarean section 09/25/2006    Family History  Problem Relation Age of Onset  . Aneurysm Mother   . Hypertension Mother   . Breast cancer Mother 63  . Aneurysm Father   . Hypertension Father   . Heart disease Father   . Breast cancer Maternal Aunt   . Breast cancer Maternal Grandmother     No Known Allergies  Current Outpatient Prescriptions on File Prior to Visit  Medication Sig Dispense Refill  . Multiple Vitamin (MULTIVITAMIN) capsule Take 1 capsule by mouth daily.        . hydrochlorothiazide (MICROZIDE) 12.5 MG capsule Take 1 capsule (12.5 mg total) by mouth daily.  30 capsule  3    BP 140/94  Pulse 72  Temp(Src) 99.3 F (37.4 C) (Oral)  Ht 5' 4.5" (1.638 m)  Wt 188 lb (85.276 kg)  BMI 31.77 kg/m2  LMP 10/10/2011       Objective:   Physical Exam  Constitutional: She is oriented to person, place, and time. She appears well-developed and well-nourished.  HENT:  Head: Normocephalic and atraumatic.  Right Ear: External ear normal.  Left Ear: External ear normal.  Mouth/Throat: Oropharynx is clear and moist.       Crowded oropharynx  Eyes: EOM are normal. Pupils are equal, round, and reactive to light.  Neck: Normal range of motion. Neck supple.       No carotid bruit  Cardiovascular: Normal rate, regular rhythm, normal heart sounds and intact distal pulses.   No murmur heard. Pulmonary/Chest: Effort normal and breath sounds normal. She has no wheezes.  Abdominal: Soft. Bowel sounds are normal. She  exhibits no mass. There is no tenderness.  Musculoskeletal: Normal range of motion. She exhibits no edema.  Lymphadenopathy:    She has no cervical adenopathy.  Neurological: She is alert and oriented to person, place, and time. No cranial nerve deficit.  Skin: Skin is warm and dry.  Psychiatric: She has a normal mood and affect.       Assessment & Plan:

## 2011-11-29 ENCOUNTER — Telehealth: Payer: Self-pay | Admitting: *Deleted

## 2011-11-29 NOTE — Telephone Encounter (Signed)
(  Pt aware you at out of the office) pt would like to start back on depo provera, okay to send rx or should pt make OV. Annual done in may. Please advise

## 2011-11-30 ENCOUNTER — Other Ambulatory Visit: Payer: Self-pay | Admitting: Women's Health

## 2011-11-30 ENCOUNTER — Ambulatory Visit (INDEPENDENT_AMBULATORY_CARE_PROVIDER_SITE_OTHER): Payer: BC Managed Care – PPO | Admitting: Anesthesiology

## 2011-11-30 DIAGNOSIS — Z309 Encounter for contraceptive management, unspecified: Secondary | ICD-10-CM

## 2011-11-30 DIAGNOSIS — IMO0001 Reserved for inherently not codable concepts without codable children: Secondary | ICD-10-CM

## 2011-11-30 MED ORDER — MEDROXYPROGESTERONE ACETATE 150 MG/ML IM SUSP: 150.0000 mg | Freq: Once | INTRAMUSCULAR | Status: DC

## 2011-11-30 MED ORDER — MEDROXYPROGESTERONE ACETATE 150 MG/ML IM SUSP
150.0000 mg | Freq: Once | INTRAMUSCULAR | Status: AC
  Administered 2011-11-30: 150 mg via INTRAMUSCULAR

## 2011-11-30 NOTE — Telephone Encounter (Signed)
If within the week of cycle ok to take depo today

## 2011-11-30 NOTE — Telephone Encounter (Signed)
Pt said her cycle stopped yesterday and doesn't want to wait until next cycle she would like to have depo now if possible? If not okay pt would like to start on birth control pills. Please advise

## 2011-11-30 NOTE — Telephone Encounter (Signed)
Left the below on pt voicemail. 

## 2011-11-30 NOTE — Telephone Encounter (Signed)
Telephone call, reviewed OK for depo, will not affect blood pressure. Reviewed importance of a high calcium rich diet. Reviewed IUDs, declines. Will call in depo. Has a friend who is a nurse he usually gives depo, reviewed to start with next cycle. Condoms first month.

## 2011-12-16 ENCOUNTER — Other Ambulatory Visit (INDEPENDENT_AMBULATORY_CARE_PROVIDER_SITE_OTHER): Payer: BC Managed Care – PPO

## 2011-12-16 DIAGNOSIS — I10 Essential (primary) hypertension: Secondary | ICD-10-CM

## 2011-12-16 LAB — BASIC METABOLIC PANEL
CO2: 24 mEq/L (ref 19–32)
Chloride: 111 mEq/L (ref 96–112)
Creatinine, Ser: 0.9 mg/dL (ref 0.4–1.2)
Potassium: 3.7 mEq/L (ref 3.5–5.1)
Sodium: 140 mEq/L (ref 135–145)

## 2011-12-30 ENCOUNTER — Ambulatory Visit: Payer: BC Managed Care – PPO | Admitting: Internal Medicine

## 2012-01-12 ENCOUNTER — Encounter: Payer: Self-pay | Admitting: Internal Medicine

## 2012-01-12 ENCOUNTER — Ambulatory Visit (INDEPENDENT_AMBULATORY_CARE_PROVIDER_SITE_OTHER): Payer: BC Managed Care – PPO | Admitting: Internal Medicine

## 2012-01-12 VITALS — BP 110/70 | Temp 98.6°F | Wt 182.0 lb

## 2012-01-12 DIAGNOSIS — Z0000 Encounter for general adult medical examination without abnormal findings: Secondary | ICD-10-CM

## 2012-01-12 DIAGNOSIS — I10 Essential (primary) hypertension: Secondary | ICD-10-CM

## 2012-01-12 DIAGNOSIS — Z23 Encounter for immunization: Secondary | ICD-10-CM

## 2012-01-12 MED ORDER — HYDROCHLOROTHIAZIDE 12.5 MG PO CAPS: 12.5000 mg | ORAL_CAPSULE | Freq: Every day | ORAL | Status: DC

## 2012-01-12 NOTE — Assessment & Plan Note (Signed)
Good response to HCTZ.  Continue same dose 12.5 mg.   Reassess in 6 months. Lab Results  Component Value Date   CREATININE 0.9 12/16/2011   Lab Results  Component Value Date   NA 140 12/16/2011   K 3.7 12/16/2011   CL 111 12/16/2011   CO2 24 12/16/2011

## 2012-01-12 NOTE — Progress Notes (Signed)
  Subjective:    Patient ID: Kelsey Cameron, female    DOB: 07-17-1980, 31 y.o.   MRN: [id number]  HPI  31 year old [place] American female previously seen for hypertension for followup. Patient has been started on hydrochlorothiazide 12.5 mg once daily. Patient is tolerating medication without difficulty. She has just noticed increase in urinary frequency. Her followup electrolytes and kidney function were normal.  Review of Systems Negative for chest pain  Past Medical History  Diagnosis Date  . PONV (postoperative nausea and vomiting)   . Macromastia   . STD (sexually transmitted disease) 2002    [place]    History   Social History  . Marital Status: Single    Spouse Name: N/A    Number of Children: N/A  . Years of Education: N/A   Occupational History  . Not on file.   Social History Main Topics  . Smoking status: Former Smoker    Types: Cigarettes  . Smokeless tobacco: Never Used   Comment: quit smoking 10/2010  . Alcohol Use: Yes     1 drink/month  . Drug Use: No  . Sexually Active: Yes    Birth Control/ Protection: Condom   Other Topics Concern  . Not on file   Social History Narrative  . No narrative on file    Past Surgical History  Procedure Date  . Cholecystectomy 2009  . Breast reduction surgery 05/17/2011    Procedure: MAMMARY REDUCTION BILATERAL (BREAST);  Surgeon: Mary A Contogiannis;  Location: [facility];  Service: Plastics;  Laterality: Bilateral;  . Cesarean section 09/25/2006    Family History  Problem Relation Age of Onset  . Aneurysm Mother   . Hypertension Mother   . Breast cancer Mother 3  . Aneurysm Father   . Hypertension Father   . Heart disease Father   . Breast cancer Maternal Aunt   . Breast cancer Maternal Grandmother     No Known Allergies  Current Outpatient Prescriptions on File Prior to Visit  Medication Sig Dispense Refill  . medroxyPROGESTERone (DEPO-PROVERA) 150 MG/ML injection Inject 1 mL  (150 mg total) into the muscle once.  1 mL  4  . Multiple Vitamin (MULTIVITAMIN) capsule Take 1 capsule by mouth daily.        Marland Kitchen DISCONTD: hydrochlorothiazide (MICROZIDE) 12.5 MG capsule Take 1 capsule (12.5 mg total) by mouth daily.  30 capsule  3    BP 110/70  Temp 98.6 F (37 C) (Oral)  Wt 182 lb (82.555 kg)       Objective:   Physical Exam  Constitutional: She is oriented to person, place, and time. She appears well-developed and well-nourished.  Cardiovascular: Normal rate, regular rhythm and normal heart sounds.   Pulmonary/Chest: Effort normal and breath sounds normal. She has no wheezes.  Neurological: She is alert and oriented to person, place, and time.  Skin: Skin is warm and dry.          Assessment & Plan:

## 2012-07-09 ENCOUNTER — Encounter: Payer: Self-pay | Admitting: Women's Health

## 2012-07-09 ENCOUNTER — Ambulatory Visit (INDEPENDENT_AMBULATORY_CARE_PROVIDER_SITE_OTHER): Payer: BC Managed Care – PPO | Admitting: Women's Health

## 2012-07-09 DIAGNOSIS — N912 Amenorrhea, unspecified: Secondary | ICD-10-CM

## 2012-07-09 LAB — PREGNANCY, URINE: Preg Test, Ur: NEGATIVE

## 2012-07-09 MED ORDER — MEDROXYPROGESTERONE ACETATE 10 MG PO TABS: 10.0000 mg | ORAL_TABLET | Freq: Every day | ORAL | Status: DC

## 2012-07-09 NOTE — Progress Notes (Signed)
Patient ID: Kelsey Cameron, female   DOB: 1980/09/01, 33 y.o.   MRN: [id number] Presents to discuss contraception. Amenorrhea x6 weeks, cycle in December 2 days only. Condoms. Has had negative home U PTs. Currently on HCTZ 12.5 with stabilized blood pressures. Had been on Depo-Provera and Implanon but did not like. Desires no more children. Same partner, denies vaginal discharge, urinary symptoms or pain. Has had a normal TSH and prolactin, history of irregular cycles.  Exam: Appears well. U PT: Negative  Amenorrhea  Plan: Reviewed contraceptive options, will try Mirena IUD. Handout given and reviewed slight risk for infection, perforation, hemorrhage. Will check coverage and have Dr. Brayton Layman to place with next cycle. Withdraw with Provera 10mg  for 5 days and call with cycle. Instructed to call if no cycle.

## 2012-07-09 NOTE — Patient Instructions (Signed)
Levonorgestrel intrauterine device (IUD) What is this medicine? LEVONORGESTREL IUD (LEE voe nor jes trel) is a contraceptive (birth control) device. The device is placed inside the uterus by a healthcare professional. It is used to prevent pregnancy and can also be used to treat heavy bleeding that occurs during your period. Depending on the device, it can be used for 3 to 5 years. This medicine may be used for other purposes; ask your health care provider or pharmacist if you have questions. What should I tell my health care provider before I take this medicine? They need to know if you have any of these conditions: -abnormal Pap smear -cancer of the breast, uterus, or cervix -diabetes -endometritis -genital or pelvic infection now or in the past -have more than one sexual partner or your partner has more than one partner -heart disease -history of an ectopic or tubal pregnancy -immune system problems -IUD in place -liver disease or tumor -problems with blood clots or take blood-thinners -use intravenous drugs -uterus of unusual shape -vaginal bleeding that has not been explained -an unusual or allergic reaction to levonorgestrel, other hormones, silicone, or polyethylene, medicines, foods, dyes, or preservatives -pregnant or trying to get pregnant -breast-feeding How should I use this medicine? This device is placed inside the uterus by a health care professional. Talk to your pediatrician regarding the use of this medicine in children. Special care may be needed. Overdosage: If you think you have taken too much of this medicine contact a poison control center or emergency room at once. NOTE: This medicine is only for you. Do not share this medicine with others. What if I miss a dose? This does not apply. What may interact with this medicine? Do not take this medicine with any of the following medications: -amprenavir -bosentan -fosamprenavir This medicine may also interact with  the following medications: -aprepitant -barbiturate medicines for inducing sleep or treating seizures -bexarotene -griseofulvin -medicines to treat seizures like carbamazepine, ethotoin, felbamate, oxcarbazepine, phenytoin, topiramate -modafinil -pioglitazone -rifabutin -rifampin -rifapentine -some medicines to treat HIV infection like atazanavir, indinavir, lopinavir, nelfinavir, tipranavir, ritonavir -St. John's wort -warfarin This list may not describe all possible interactions. Give your health care provider a list of all the medicines, herbs, non-prescription drugs, or dietary supplements you use. Also tell them if you smoke, drink alcohol, or use illegal drugs. Some items may interact with your medicine. What should I watch for while using this medicine? Visit your doctor or health care professional for regular check ups. See your doctor if you or your partner has sexual contact with others, becomes HIV positive, or gets a sexual transmitted disease. This product does not protect you against HIV infection (AIDS) or other sexually transmitted diseases. You can check the placement of the IUD yourself by reaching up to the top of your vagina with clean fingers to feel the threads. Do not pull on the threads. It is a good habit to check placement after each menstrual period. Call your doctor right away if you feel more of the IUD than just the threads or if you cannot feel the threads at all. The IUD may come out by itself. You may become pregnant if the device comes out. If you notice that the IUD has come out use a backup birth control method like condoms and call your health care provider. Using tampons will not change the position of the IUD and are okay to use during your period. What side effects may I notice from receiving this medicine?   Side effects that you should report to your doctor or health care professional as soon as possible: -allergic reactions like skin rash, itching or  hives, swelling of the face, lips, or tongue -fever, flu-like symptoms -genital sores -high blood pressure -no menstrual period for 6 weeks during use -pain, swelling, warmth in the leg -pelvic pain or tenderness -severe or sudden headache -signs of pregnancy -stomach cramping -sudden shortness of breath -trouble with balance, talking, or walking -unusual vaginal bleeding, discharge -yellowing of the eyes or skin Side effects that usually do not require medical attention (report to your doctor or health care professional if they continue or are bothersome): -acne -breast pain -change in sex drive or performance -changes in weight -cramping, dizziness, or faintness while the device is being inserted -headache -irregular menstrual bleeding within first 3 to 6 months of use -nausea This list may not describe all possible side effects. Call your doctor for medical advice about side effects. You may report side effects to FDA at 1-800-FDA-1088. Where should I keep my medicine? This does not apply. NOTE: This sheet is a summary. It may not cover all possible information. If you have questions about this medicine, talk to your doctor, pharmacist, or health care provider.  2013, [employer] Standard. (07/07/2011 1:54:04 PM)  

## 2012-07-10 ENCOUNTER — Telehealth: Payer: Self-pay | Admitting: Gynecology

## 2012-07-10 NOTE — Telephone Encounter (Signed)
Pt was advised today(I left message on cell vm)that her BC ins covers the Mirena IUD & insertion at 100%, no copay or deductible. She will call back when she wants to proceed. Was advised needs to be on her cycle when IUD is inserted/WL

## 2012-10-25 ENCOUNTER — Encounter: Payer: Self-pay | Admitting: Women's Health

## 2012-10-25 ENCOUNTER — Ambulatory Visit (INDEPENDENT_AMBULATORY_CARE_PROVIDER_SITE_OTHER): Payer: BC Managed Care – PPO | Admitting: Women's Health

## 2012-10-25 VITALS — BP 140/92 | Ht 64.0 in | Wt 183.0 lb

## 2012-10-25 DIAGNOSIS — Z01419 Encounter for gynecological examination (general) (routine) without abnormal findings: Secondary | ICD-10-CM

## 2012-10-25 DIAGNOSIS — N760 Acute vaginitis: Secondary | ICD-10-CM

## 2012-10-25 DIAGNOSIS — A499 Bacterial infection, unspecified: Secondary | ICD-10-CM

## 2012-10-25 DIAGNOSIS — B9689 Other specified bacterial agents as the cause of diseases classified elsewhere: Secondary | ICD-10-CM

## 2012-10-25 LAB — WET PREP FOR TRICH, YEAST, CLUE
Trich, Wet Prep: NONE SEEN
WBC, Wet Prep HPF POC: NONE SEEN
Yeast Wet Prep HPF POC: NONE SEEN

## 2012-10-25 MED ORDER — METRONIDAZOLE 0.75 % VA GEL: VAGINAL | Status: DC

## 2012-10-25 NOTE — Progress Notes (Signed)
Kelsey Cameron [birth year] [id number]    History:    The patient presents for annual exam.   Monthly cycle/not sexually active/negative STD screen with past partner. Hypertension primary care manages labs and meds. Excellent lipid panel and glucose 2013. History of normal Paps. Breast reduction 04/2011   Past medical history, past surgical history, family history and social history were all reviewed and documented in the EPIC chart. Works at [employer] to graduate next year. Kelsey Cameron 4 doing well. Parents hypertension, mother breast cancer age 10, aneurysm father- heart disease.   ROS:  A  ROS was performed and pertinent positives and negatives are included in the history.  Exam:  Filed Vitals:   10/25/12 0827  BP: 140/92    General appearance:  Normal Head/Neck:  Normal, without cervical or supraclavicular adenopathy. Thyroid:  Symmetrical, normal in size, without palpable masses or nodularity. Respiratory  Effort:  Normal  Auscultation:  Clear without wheezing or rhonchi Cardiovascular  Auscultation:  Regular rate, without rubs, murmurs or gallops  Edema/varicosities:  Not grossly evident Abdominal  Soft,nontender, without masses, guarding or rebound.  Liver/spleen:  No organomegaly noted  Hernia:  None appreciated  Skin  Inspection:  Grossly normal  Palpation:  Grossly normal Neurologic/psychiatric  Orientation:  Normal with appropriate conversation.  Mood/affect:  Normal  Genitourinary    Breasts: Examined lying and sitting.     Right: Without masses, retractions, discharge or axillary adenopathy.     Left: Without masses, retractions, discharge or axillary adenopathy.   Inguinal/mons:  Normal without inguinal adenopathy  External genitalia:  Normal  BUS/Urethra/Skene's glands:  Normal  Bladder:  Normal  Vagina:  Wet prep positive for amines and clues.  Cervix:  Normal  Uterus:  normal in size, shape and contour.  Midline and  mobile  Adnexa/parametria:     Rt: Without masses or tenderness.   Lt: Without masses or tenderness.  Anus and perineum: Normal  Digital rectal exam: Normal sphincter tone without palpated masses or tenderness  Assessment/Plan:  32 y.o. SBF G1P1 for annual exam with no complaints.     Bacteria vaginosis Hypertension-primary care labs and meds  Plan: MetroGel vaginal cream 1 applicator at bedtime x5, alcohol precautions reviewed. [facility] to call if no relief of symptoms. Condoms encouraged when sexually active, return to office if desires Mirena IUD, information reviewed. Aware Dr. Lily Peer would place with a cycle. SBE's, continue regular exercise, calcium rich diet, vitamin D 1000 daily encouraged. Pap normal 2013, new screening guidelines reviewed.    Harrington Challenger WHNP, 9:22 AM 10/25/2012

## 2012-10-25 NOTE — Patient Instructions (Addendum)
Health Maintenance, Females A healthy lifestyle and preventative care can promote health and wellness.  Maintain regular health, dental, and eye exams.  Eat a healthy diet. Foods like vegetables, fruits, whole grains, low-fat dairy products, and lean protein foods contain the nutrients you need without too many calories. Decrease your intake of foods high in solid fats, added sugars, and salt. Get information about a proper diet from your caregiver, if necessary.  Regular physical exercise is one of the most important things you can do for your health. Most adults should get at least 150 minutes of moderate-intensity exercise (any activity that increases your heart rate and causes you to sweat) each week. In addition, most adults need muscle-strengthening exercises on 2 or more days a week.   Maintain a healthy weight. The body mass index (BMI) is a screening tool to identify possible weight problems. It provides an estimate of body fat based on height and weight. Your caregiver can help determine your BMI, and can help you achieve or maintain a healthy weight. For adults 20 years and older:  A BMI below 18.5 is considered underweight.  A BMI of 18.5 to 24.9 is normal.  A BMI of 25 to 29.9 is considered overweight.  A BMI of 30 and above is considered obese.  Maintain normal blood lipids and cholesterol by exercising and minimizing your intake of saturated fat. Eat a balanced diet with plenty of fruits and vegetables. Blood tests for lipids and cholesterol should begin at age 20 and be repeated every 5 years. If your lipid or cholesterol levels are high, you are over 50, or you are a high risk for heart disease, you may need your cholesterol levels checked more frequently.Ongoing high lipid and cholesterol levels should be treated with medicines if diet and exercise are not effective.  If you smoke, find out from your caregiver how to quit. If you do not use tobacco, do not start.  If you  are pregnant, do not drink alcohol. If you are breastfeeding, be very cautious about drinking alcohol. If you are not pregnant and choose to drink alcohol, do not exceed 1 drink per day. One drink is considered to be 12 ounces (355 mL) of beer, 5 ounces (148 mL) of wine, or 1.5 ounces (44 mL) of liquor.  Avoid use of street drugs. Do not share needles with anyone. Ask for help if you need support or instructions about stopping the use of drugs.  High blood pressure causes heart disease and increases the risk of stroke. Blood pressure should be checked at least every 1 to 2 years. Ongoing high blood pressure should be treated with medicines, if weight loss and exercise are not effective.  If you are 55 to 34 years old, ask your caregiver if you should take aspirin to prevent strokes.  Diabetes screening involves taking a blood sample to check your fasting blood sugar level. This should be done once every 3 years, after age 45, if you are within normal weight and without risk factors for diabetes. Testing should be considered at a younger age or be carried out more frequently if you are overweight and have at least 1 risk factor for diabetes.  Breast cancer screening is essential preventative care for women. You should practice "breast self-awareness." This means understanding the normal appearance and feel of your breasts and may include breast self-examination. Any changes detected, no matter how small, should be reported to a caregiver. Women in their 20s and 30s should have   a clinical breast exam (CBE) by a caregiver as part of a regular health exam every 1 to 3 years. After age 40, women should have a CBE every year. Starting at age 40, women should consider having a mammogram (breast X-ray) every year. Women who have a family history of breast cancer should talk to their caregiver about genetic screening. Women at a high risk of breast cancer should talk to their caregiver about having an MRI and a  mammogram every year.  The Pap test is a screening test for cervical cancer. Women should have a Pap test starting at age 21. Between ages 21 and 29, Pap tests should be repeated every 2 years. Beginning at age 30, you should have a Pap test every 3 years as long as the past 3 Pap tests have been normal. If you had a hysterectomy for a problem that was not cancer or a condition that could lead to cancer, then you no longer need Pap tests. If you are between ages 65 and 70, and you have had normal Pap tests going back 10 years, you no longer need Pap tests. If you have had past treatment for cervical cancer or a condition that could lead to cancer, you need Pap tests and screening for cancer for at least 20 years after your treatment. If Pap tests have been discontinued, risk factors (such as a new sexual partner) need to be reassessed to determine if screening should be resumed. Some women have medical problems that increase the chance of getting cervical cancer. In these cases, your caregiver may recommend more frequent screening and Pap tests.  The human papillomavirus (HPV) test is an additional test that may be used for cervical cancer screening. The HPV test looks for the virus that can cause the cell changes on the cervix. The cells collected during the Pap test can be tested for HPV. The HPV test could be used to screen women aged 30 years and older, and should be used in women of any age who have unclear Pap test results. After the age of 30, women should have HPV testing at the same frequency as a Pap test.  Colorectal cancer can be detected and often prevented. Most routine colorectal cancer screening begins at the age of 50 and continues through age 75. However, your caregiver may recommend screening at an earlier age if you have risk factors for colon cancer. On a yearly basis, your caregiver may provide home test kits to check for hidden blood in the stool. Use of a small camera at the end of a  tube, to directly examine the colon (sigmoidoscopy or colonoscopy), can detect the earliest forms of colorectal cancer. Talk to your caregiver about this at age 50, when routine screening begins. Direct examination of the colon should be repeated every 5 to 10 years through age 75, unless early forms of pre-cancerous polyps or small growths are found.  Hepatitis C blood testing is recommended for all people born from 1945 through 1965 and any individual with known risks for hepatitis C.  Practice safe sex. Use condoms and avoid high-risk sexual practices to reduce the spread of sexually transmitted infections (STIs). Sexually active women aged 25 and younger should be checked for Chlamydia, which is a common sexually transmitted infection. Older women with new or multiple partners should also be tested for Chlamydia. Testing for other STIs is recommended if you are sexually active and at increased risk.  Osteoporosis is a disease in which the   bones lose minerals and strength with aging. This can result in serious bone fractures. The risk of osteoporosis can be identified using a bone density scan. Women ages 100 and over and women at risk for fractures or osteoporosis should discuss screening with their caregivers. Ask your caregiver whether you should be taking a calcium supplement or vitamin D to reduce the rate of osteoporosis.  Menopause can be associated with physical symptoms and risks. Hormone replacement therapy is available to decrease symptoms and risks. You should talk to your caregiver about whether hormone replacement therapy is right for you.  Use sunscreen with a sun protection factor (SPF) of 30 or greater. Apply sunscreen liberally and repeatedly throughout the day. You should seek shade when your shadow is shorter than you. Protect yourself by wearing long sleeves, pants, a wide-brimmed hat, and sunglasses year round, whenever you are outdoors.  Notify your caregiver of new moles or  changes in moles, especially if there is a change in shape or color. Also notify your caregiver if a mole is larger than the size of a pencil eraser.  Stay current with your immunizations. Document Released: 12/20/2010 Document Revised: 08/29/2011 Document Reviewed: 12/20/2010 [facility] Patient Information 2013 [place], [place]. 1500 Calorie Diabetic Diet The 1500 calorie diabetic diet limits calories to 1500 each day. Following this diet and making healthy meal choices can help improve overall health. It controls blood glucose (sugar) levels and can also help lower blood pressure and cholesterol.  SERVING SIZES Measuring foods and serving sizes helps to make sure you are getting the right amount of food. The list below tells how big or small some common serving sizes are.  1 oz.........4 stacked dice.  3 oz........Marland KitchenDeck of cards.  1 tsp.......Marland KitchenTip of little finger.  1 tbs......Marland KitchenMarland KitchenThumb.  2 tbs.......Marland KitchenGolf ball.   cup......Marland KitchenHalf of a fist.  1 cup.......Marland KitchenA fist. GUIDELINES FOR CHOOSING FOODS The goal of this diet is to eat a variety of foods and limit calories to 1500 each day. This can be done by choosing foods that are low in calories and fat. The diet also suggests eating small amounts of food frequently. Doing this helps control your blood glucose levels, so they do not get too high or too low. Each meal or snack may include a protein food source to help you feel more satisfied. Try to eat about the same amount of food around the same time each day. This includes weekend days, travel days, and days off work. Space your meals about 4 to 5 hours apart, and add a snack between them, if you wish.  For example, a daily food plan could include breakfast, a morning snack, lunch, dinner, and an evening snack. Healthy meals and snacks have different types of foods, including whole grains, vegetables, fruits, lean meats, poultry, fish, and dairy products. As you plan your meals, select a variety of  foods. Choose from the bread and starch, vegetable, fruit, dairy, and meat/protein groups. Examples of foods from each group are listed below, with their suggested serving sizes. Use measuring cups and spoons to become familiar with what a healthy portion looks like. Bread and Starch Each serving equals 15 grams of carbohydrate.  1 slice bread.   bagel.   cup cold cereal (unsweetened).   cup hot cereal or mashed potatoes.  1 small potato (size of a computer mouse).   cup cooked pasta or rice.   English muffin.  1 cup broth-based soup.  3 cups of popcorn.  4 to 6 whole-wheat  crackers.   cup cooked beans, peas, or corn. Vegetables Each serving equals 5 grams of carbohydrate.   cup cooked vegetables.  1 cup raw vegetables.   cup tomato or vegetable juice. Fruit Each serving equals 15 grams of carbohydrate.  1 small apple or orange.  1  cup watermelon or strawberries.   cup applesauce (no sugar added).  2 tbs raisins.   banana.   cup canned fruit, packed in water or in its own juice.   cup unsweetened fruit juice. Dairy Each serving equals 12 to 15 grams of carbohydrate.  1 cup fat-free milk.  6 oz artificially sweetened yogurt or plain yogurt.  1 cup low-fat buttermilk.  1 cup soy milk.  1 cup almond milk. Meat/Protein  1 large egg.  2 to 3 oz meat, poultry, or fish.   cup low-fat cottage cheese.  1 tbs peanut butter.  1 oz low-fat cheese.   cup tuna, packed in water.   cup tofu. Fat  1 tsp oil.  1 tsp trans-fat-free margarine.  1 tsp butter.  1 tsp mayonnaise.  2 tbs avocado.  1 tbs salad dressing.  1 tbs cream cheese.  2 tbs sour cream. SAMPLE 1500 CALORIE DIET PLAN Breakfast   whole-wheat English muffin (1 carb serving).  1 tsp trans-fat-free margarine.  1 scrambled egg.  1 cup fat-free milk (1 carb serving).  1 small orange (1 carb serving). Lunch  Chicken wrap.  1 whole-wheat tortilla,  8-inch (1 carb servings).  2 oz chicken breast, sliced.  2 tbs low-fat salad dressing, such as [place].   cup shredded lettuce.  2 slices tomato.   cup carrot sticks.  1 small apple (1 carb serving). Afternoon Snack  3 graham cracker squares (1 carb serving).  1 tbs peanut butter. Dinner  2 oz lean pork chop, broiled.  1 cup brown rice (3 carb servings).   cup steamed carrots.   cup green beans.  1 cup fat-free milk (1 carb serving).  1 tsp trans-fat-free margarine. Evening Snack   cup low-fat cottage cheese.  1 small peach or pear, sliced (or  cup canned in water) (1 carb serving). MEAL PLAN You can use this worksheet to help you make a daily meal plan based on the 1500 calorie diabetic diet suggestions. If you are using this plan to help you control your blood glucose, you may interchange carbohydrate containing foods (dairy, starches, and fruits). Select a variety of fresh foods of varying colors and flavors. The total amount of carbohydrate in your meals or snacks is more important than making sure you include all of the food groups every time you eat. You can choose from approximately this many of the following foods to build your day's meals:  6 Starches.  3 Vegetables.  2 Fruits.  2 Dairy.  4 to 6 oz Meat/Protein.  Up to 3 Fats. Your dietician can use this worksheet to help you decide how many servings and which types of foods are right for you. BREAKFAST Food Group and Servings / Food Choice Starch _________________________________________________________ Dairy __________________________________________________________ Fruit ___________________________________________________________ Meat/Protein____________________________________________________ Fat ____________________________________________________________ LUNCH Food Group and Servings / Food Choice  Starch _________________________________________________________ Meat/Protein  ___________________________________________________ Vegetables _____________________________________________________ Fruit __________________________________________________________ Dairy __________________________________________________________ Fat ____________________________________________________________ Aura Fey Food Group and Servings / Food Choice Dairy __________________________________________________________ Starch _________________________________________________________ Meat/Protein____________________________________________________ Fruit ___________________________________________________________ Laural Golden Food Group and Servings / Food Choice Starch _________________________________________________________ Meat/Protein ___________________________________________________ Dairy __________________________________________________________ Vegetable ______________________________________________________ Fruit ___________________________________________________________ Fat ____________________________________________________________ Lollie Sails Food Group and Servings / Food Choice Fruit  ___________________________________________________________ Meat/Protein ____________________________________________________ Dairy __________________________________________________________ Starch __________________________________________________________ DAILY TOTALS Starches _________________________ Vegetables _______________________ Fruits ____________________________ Dairy ____________________________ Meat/Protein_____________________ Fats _____________________________ Document Released: 12/27/2004 Document Revised: 08/29/2011 Document Reviewed: 04/23/2009 [employer] Patient Information 2013 [place], [place].

## 2013-01-29 ENCOUNTER — Encounter: Payer: Self-pay | Admitting: Family Medicine

## 2013-01-29 ENCOUNTER — Ambulatory Visit (INDEPENDENT_AMBULATORY_CARE_PROVIDER_SITE_OTHER): Payer: BC Managed Care – PPO | Admitting: Family Medicine

## 2013-01-29 ENCOUNTER — Other Ambulatory Visit: Payer: Self-pay | Admitting: Internal Medicine

## 2013-01-29 VITALS — BP 128/100 | Temp 98.9°F | Wt 187.0 lb

## 2013-01-29 DIAGNOSIS — J329 Chronic sinusitis, unspecified: Secondary | ICD-10-CM

## 2013-01-29 MED ORDER — AMOXICILLIN 875 MG PO TABS: 875.0000 mg | ORAL_TABLET | Freq: Two times a day (BID) | ORAL | Status: DC

## 2013-01-29 NOTE — Progress Notes (Signed)
Chief Complaint  Patient presents with  . Sinusitis    HPI:  Kelsey Cameron is a 32 yo F patient of Dr. Artist Pais here for acute visit for sinus congestion: -started about 2 weeks ago -symptoms: nasal congestion, sinus pain and pressure, ears full, drainage, cough - worsening -has tried over counter medications -denies: fevers, chills, NVD  ROS: See pertinent positives and negatives per HPI.  Past Medical History  Diagnosis Date  . PONV (postoperative nausea and vomiting)   . Macromastia   . STD (sexually transmitted disease) 2002    CHALMYDIA  . Hypertension     Family History  Problem Relation Age of Onset  . Aneurysm Mother   . Hypertension Mother   . Breast cancer Mother 53  . Aneurysm Father   . Hypertension Father   . Heart disease Father   . Breast cancer Maternal Aunt   . Breast cancer Maternal Grandmother     History   Social History  . Marital Status: Single    Spouse Name: N/A    Number of Children: N/A  . Years of Education: N/A   Social History Main Topics  . Smoking status: Former Smoker    Types: Cigarettes  . Smokeless tobacco: Never Used     Comment: quit smoking 10/2010  . Alcohol Use: Yes     Comment: 1 drink/month  . Drug Use: No  . Sexually Active: No   Other Topics Concern  . None   Social History Narrative  . None    Current outpatient prescriptions:Multiple Vitamin (MULTIVITAMIN) capsule, Take 1 capsule by mouth daily.  , Disp: , Rfl: ;  amoxicillin (AMOXIL) 875 MG tablet, Take 1 tablet (875 mg total) by mouth 2 (two) times daily., Disp: 20 tablet, Rfl: 0;  hydrochlorothiazide (MICROZIDE) 12.5 MG capsule, Take 1 capsule (12.5 mg total) by mouth daily., Disp: 90 capsule, Rfl: 1 metroNIDAZOLE (METROGEL VAGINAL) 0.75 % vaginal gel, 1 applicator per vagina at HS x 5, Disp: 70 g, Rfl: 1  EXAM:  Filed Vitals:   01/29/13 1052  BP: 128/100  Temp: 98.9 F (37.2 C)    Body mass index is 32.08 kg/(m^2).  GENERAL: vitals reviewed  and listed above, alert, oriented, appears well hydrated and in no acute distress  HEENT: atraumatic, conjunttiva clear, no obvious abnormalities on inspection of external nose and ears, normal appearance of ear canals and TMs, white nasal congestion, mild post oropharyngeal erythema with PND, no tonsillar edema or exudate, L sinus TTP  NECK: no obvious masses on inspection  LUNGS: clear to auscultation bilaterally, no wheezes, rales or rhonchi, good air movement  CV: HRRR, no peripheral edema  MS: moves all extremities without noticeable abnormality  PSYCH: pleasant and cooperative, no obvious depression or anxiety  ASSESSMENT AND PLAN:  Discussed the following assessment and plan:  Sinusitis - Plan: amoxicillin (AMOXIL) 875 MG tablet  -worsening after 2 weeks and with sinus pain  Recommendations per orders and instructions, risks and use of medications and return precautions discussed. -Patient advised to return or notify a doctor immediately if symptoms worsen or persist or new concerns arise.  There are no Patient Instructions on file for this visit.   Kriste Basque R.

## 2013-02-01 ENCOUNTER — Telehealth: Payer: Self-pay | Admitting: Internal Medicine

## 2013-02-01 NOTE — Telephone Encounter (Signed)
Patient Information:  Caller Name: Auset  Phone: [phone]  Patient: Kelsey Cameron, Kelsey Cameron  Gender: Female  DOB: December 25, 1980  Age: 32 Years  PCP: Artist Pais Doe-Hyun Molly Maduro) (Adults only)  Pregnant: No  Office Follow Up:  Does the office need to follow up with this patient?: No  Instructions For The Office: N/A  RN Note:  Sinus symptoms are improving. Nasal discharge is clear. Hydrate and hymidify.  May use Guaifenesin prn to loosen mucus.  Symptoms  Reason For Call & Symptoms: Prdductive cough after beginning Amoxicillin for sinus infection. Asking what otc medication to use since she has HTN.  Reviewed Health History In EMR: Yes  Reviewed Medications In EMR: Yes  Reviewed Allergies In EMR: Yes  Reviewed Surgeries / Procedures: Yes  Date of Onset of Symptoms: 01/30/2013  Treatments Tried: Amoxicillin  Treatments Tried Worked: No OB / GYN:  LMP: 01/30/2013  Guideline(s) Used:  Cough  Disposition Per Guideline:   Home Care  Reason For Disposition Reached:   Cough with cold symptoms (e.g., runny nose, postnasal drip, throat clearing)  Advice Given:  Reassurance  Coughing is the way that our lungs remove irritants and mucus. It helps protect our lungs from getting pneumonia.  You can get a dry hacking cough after a chest cold. Sometimes this type of cough can last 1-3 weeks, and be worse at night.  Cough Medicines:  Home Remedy - Hard Candy: Hard candy works just as well as medicine-flavored OTC cough drops. Diabetics should use sugar-free candy.  Home Remedy - Honey: This old home remedy has been shown to help decrease coughing at night. The adult dosage is 2 teaspoons (10 ml) at bedtime. Honey should not be given to infants under one year of age.  Coughing Spasms:  Drink warm fluids. Inhale warm mist (Reason: both relax the airway and loosen up the phlegm).  Suck on cough drops or hard candy to coat the irritated throat.  Prevent Dehydration:  Drink adequate liquids.  This will help soothe an irritated or dry throat and loosen up the phlegm.  Expected Course:   The expected course depends on what is causing the cough.  Viral bronchitis (chest cold) causes a cough that lasts 1 to 3 weeks. Sometimes you may cough up lots of phlegm (sputum, mucus). The mucus can normally be white, gray, yellow, or green.  For a Stuffy Nose - Use Nasal Washes:  Introduction: Saline (salt water) nasal irrigation (nasal wash) is an effective and simple home remedy for treating stuffy nose and sinus congestion. The nose can be irrigated by pouring, spraying, or squirting salt water into the nose and then letting it run back out.  Call Back If:  Difficulty breathing  Cough lasts more than 3 weeks  Fever lasts > 3 days  You become worse.  Patient Will Follow Care Advice:  YES

## 2013-02-08 ENCOUNTER — Ambulatory Visit (INDEPENDENT_AMBULATORY_CARE_PROVIDER_SITE_OTHER): Payer: BC Managed Care – PPO | Admitting: Family Medicine

## 2013-02-08 ENCOUNTER — Encounter: Payer: Self-pay | Admitting: Family Medicine

## 2013-02-08 ENCOUNTER — Telehealth: Payer: Self-pay | Admitting: Internal Medicine

## 2013-02-08 VITALS — BP 138/80 | Temp 98.4°F | Wt 188.0 lb

## 2013-02-08 DIAGNOSIS — T7840XA Allergy, unspecified, initial encounter: Secondary | ICD-10-CM

## 2013-02-08 DIAGNOSIS — L500 Allergic urticaria: Secondary | ICD-10-CM

## 2013-02-08 MED ORDER — EPINEPHRINE 0.3 MG/0.3ML IJ SOAJ: 0.3000 mg | Freq: Once | INTRAMUSCULAR | Status: DC

## 2013-02-08 NOTE — Progress Notes (Signed)
Chief Complaint  Patient presents with  . Allergic Reaction    HPI:  Follow up:  1) Sinusitis: -this is better after taking 9 days of amoxicillin -still has some drainage in throat and some itching of throat -denies: SOB, wheezing, sinus pain, tooth pain  2) medication reaction: -about 7 days into taking amoxicillin started having hives all over her body -this was the only new thing she has been exposed to -also itchy, stopped abx and hives are now resolving but still has itching -denies throat or mouth swelling, SOB, wheezing, GI symptoms -wants to see allergist  ROS: See pertinent positives and negatives per HPI.  Past Medical History  Diagnosis Date  . PONV (postoperative nausea and vomiting)   . Macromastia   . STD (sexually transmitted disease) 2002    CHALMYDIA  . Hypertension     Family History  Problem Relation Age of Onset  . Aneurysm Mother   . Hypertension Mother   . Breast cancer Mother 57  . Aneurysm Father   . Hypertension Father   . Heart disease Father   . Breast cancer Maternal Aunt   . Breast cancer Maternal Grandmother     History   Social History  . Marital Status: Single    Spouse Name: N/A    Number of Children: N/A  . Years of Education: N/A   Social History Main Topics  . Smoking status: Former Smoker    Types: Cigarettes  . Smokeless tobacco: Never Used     Comment: quit smoking 10/2010  . Alcohol Use: Yes     Comment: 1 drink/month  . Drug Use: No  . Sexual Activity: No   Other Topics Concern  . None   Social History Narrative  . None    Current outpatient prescriptions:hydrochlorothiazide (MICROZIDE) 12.5 MG capsule, TAKE 1 CAPSULE BY MOUTH DAILY, Disp: 30 capsule, Rfl: 0;  metroNIDAZOLE (METROGEL VAGINAL) 0.75 % vaginal gel, 1 applicator per vagina at HS x 5, Disp: 70 g, Rfl: 1;  Multiple Vitamin (MULTIVITAMIN) capsule, Take 1 capsule by mouth daily.  , Disp: , Rfl:  amoxicillin (AMOXIL) 875 MG tablet, Take 1 tablet (875  mg total) by mouth 2 (two) times daily., Disp: 20 tablet, Rfl: 0;  EPINEPHrine (EPIPEN) 0.3 mg/0.3 mL SOAJ injection, Inject 0.3 mLs (0.3 mg total) into the muscle once., Disp: 1 Device, Rfl: 1  EXAM:  Filed Vitals:   02/08/13 1033  BP: 138/80  Temp: 98.4 F (36.9 C)    Body mass index is 32.25 kg/(m^2).  GENERAL: vitals reviewed and listed above, alert, oriented, appears well hydrated and in no acute distress  HEENT: atraumatic, conjunttiva clear, no obvious abnormalities on inspection of external nose and ears  NECK: no obvious masses on inspection  LUNGS: clear to auscultation bilaterally, no wheezes, rales or rhonchi, good air movement  CV: HRRR, no peripheral edema  MS: moves all extremities without noticeable abnormality  PSYCH: pleasant and cooperative, no obvious depression or anxiety  ASSESSMENT AND PLAN:  Discussed the following assessment and plan:  Allergic reaction, initial encounter - Plan: EPINEPHrine (EPIPEN) 0.3 mg/0.3 mL SOAJ injection, Ambulatory referral to Allergy  Allergic urticaria - Plan: EPINEPHrine (EPIPEN) 0.3 mg/0.3 mL SOAJ injection, Ambulatory referral to Allergy  -Patient advised to return or notify a doctor immediately if symptoms worsen or persist or new concerns arise.  Patient Instructions  -zyrtec daily  -if severe allergic reaction, trouble breathing, throat or mouth swelling call 911 and seek care imediatley  -We  placed a referral for you as discussed to the allergist. It usually takes about 1-2 weeks to process and schedule this referral. If you have not heard from [place] regarding this appointment in 2 weeks please contact our office.      Kriste Basque R.

## 2013-02-08 NOTE — Telephone Encounter (Signed)
Patient Information:  Caller Name: Zyriah  Phone: [phone]  Patient: Kelsey Cameron, Kelsey Cameron  Gender: Female  DOB: 12/29/1980  Age: 32 Years  PCP: Artist Pais Doe-Hyun Molly Maduro) (Adults only)  Pregnant: No  Office Follow Up:  Does the office need to follow up with this patient?: No  Instructions For The Office: N/A  RN Note:  pt reports it is severely itching her  Symptoms  Reason For Call & Symptoms: pt was seen in the office on 01/29/13 for sinusitis.  Pt reports on 02/05/13 she began having a reaction to the Amoxicillin.  Pt reports hives on neck, legs, butt, arms and hands.  Pt also states hands are swollen. Pt reports the hives are disappearing but the scratching remains  Reviewed Health History In EMR: Yes  Reviewed Medications In EMR: Yes  Reviewed Allergies In EMR: Yes  Reviewed Surgeries / Procedures: Yes  Date of Onset of Symptoms: 02/05/2013  Treatments Tried: benadryl  Treatments Tried Worked: No OB / GYN:  LMP: 01/30/2013  Guideline(s) Used:  Hives  Rash - Widespread on Drugs - Drug Reaction  Disposition Per Guideline:   See Today in Office  Reason For Disposition Reached:   Hives  Advice Given:  N/A  Patient Will Follow Care Advice:  YES  Appointment Scheduled:  02/08/2013 10:45:00 Appointment Scheduled Provider:  Kriste Basque (Family Practice) (no appt found with Dr Artist Pais)

## 2013-02-08 NOTE — Patient Instructions (Signed)
-  zyrtec daily  -if severe allergic reaction, trouble breathing, throat or mouth swelling call 911 and seek care imediatley  -We placed a referral for you as discussed to the allergist. It usually takes about 1-2 weeks to process and schedule this referral. If you have not heard from [place] regarding this appointment in 2 weeks please contact our office.

## 2013-02-22 ENCOUNTER — Encounter: Payer: Self-pay | Admitting: Internal Medicine

## 2013-06-27 ENCOUNTER — Encounter: Payer: Self-pay | Admitting: Women's Health

## 2013-06-27 ENCOUNTER — Ambulatory Visit (INDEPENDENT_AMBULATORY_CARE_PROVIDER_SITE_OTHER): Payer: BC Managed Care – PPO | Admitting: Women's Health

## 2013-06-27 DIAGNOSIS — B9689 Other specified bacterial agents as the cause of diseases classified elsewhere: Secondary | ICD-10-CM

## 2013-06-27 DIAGNOSIS — N760 Acute vaginitis: Secondary | ICD-10-CM

## 2013-06-27 DIAGNOSIS — Z309 Encounter for contraceptive management, unspecified: Secondary | ICD-10-CM

## 2013-06-27 DIAGNOSIS — IMO0001 Reserved for inherently not codable concepts without codable children: Secondary | ICD-10-CM

## 2013-06-27 DIAGNOSIS — Z113 Encounter for screening for infections with a predominantly sexual mode of transmission: Secondary | ICD-10-CM

## 2013-06-27 DIAGNOSIS — A499 Bacterial infection, unspecified: Secondary | ICD-10-CM

## 2013-06-27 LAB — RPR

## 2013-06-27 LAB — WET PREP FOR TRICH, YEAST, CLUE
Trich, Wet Prep: NONE SEEN
WBC WET PREP: NONE SEEN
Yeast Wet Prep HPF POC: NONE SEEN

## 2013-06-27 LAB — HIV ANTIBODY (ROUTINE TESTING W REFLEX): HIV: NONREACTIVE

## 2013-06-27 LAB — HEPATITIS C ANTIBODY: HCV AB: NEGATIVE

## 2013-06-27 LAB — HEPATITIS B CORE ANTIBODY, IGM: Hep B C IgM: NONREACTIVE

## 2013-06-27 MED ORDER — METRONIDAZOLE 0.75 % VA GEL: VAGINAL | Status: DC

## 2013-06-27 MED ORDER — MEDROXYPROGESTERONE ACETATE 150 MG/ML IM SUSP: 150.0000 mg | INTRAMUSCULAR | Status: DC

## 2013-06-27 NOTE — Patient Instructions (Signed)
Bacterial Vaginosis Bacterial vaginosis is an infection of the vagina. A healthy vagina has many kinds of good germs (bacteria). Sometimes the number of good germs can change. This allows bad germs to move in and cause an infection. You may be given medicine (antibiotics) to treat the infection. Or, you may not need treatment at all. HOME CARE  Take your medicine as told. Finish them even if you start to feel better.  Do not have sex until you finish your medicine.  Do not douche.  Practice safe sex.  Tell your sex partner that you have an infection. They should see their doctor for treatment if they have problems. GET HELP RIGHT AWAY IF:  You do not get better after 3 days of treatment.  You have grey fluid (discharge) coming from your vagina.  You have pain.  You have a temperature of 102 F (38.9 C) or higher. MAKE SURE YOU:   Understand these instructions.  Will watch your condition.  Will get help right away if you are not doing well or get worse. Document Released: 03/15/2008 Document Revised: 08/29/2011 Document Reviewed: 01/16/2013 [facility] Patient Information 2014 [place].

## 2013-06-27 NOTE — Progress Notes (Signed)
Patient ID: Kelsey Cameron, female   DOB: [birth year], 33 y.o.   MRN: [id number] Presents with complaint of vaginal discharge and requests STD screen, new partner.  Condoms, depo provera in the past and would like to resume. Denies UTI symptoms, abd pain or fever.  Exam: Appears well. External genitalia within normal limits, speculum exam moderate amount of white adherent discharge noted with odor. GC/Chlamydia culture taken. Bimanual no CMT or adnexal fullness or tenderness.  Bacteria vaginosis STD screen Contraception management Hypertension-primary care manages  Plan: MetroGel vaginal cream 1 applicator at bedtime x5, alcohol precautions were reviewed. Contraception options reviewed, Depo-Provera 150 every 12 weeks prescription given, instructed to return to office with next cycle, continue condoms until permanent partner. GC/Chlamydia, HIV, hep B, C., RPR. Instructed to call if no relief of discharge.

## 2013-06-28 LAB — GC/CHLAMYDIA PROBE AMP
CT Probe RNA: NEGATIVE
GC PROBE AMP APTIMA: NEGATIVE

## 2013-08-12 ENCOUNTER — Other Ambulatory Visit: Payer: Self-pay | Admitting: Internal Medicine

## 2013-09-17 ENCOUNTER — Telehealth: Payer: Self-pay | Admitting: *Deleted

## 2013-09-17 NOTE — Telephone Encounter (Signed)
Pt called to ask when next depo injection due pt last injection was Feb3. I called patient and told her may.

## 2013-10-21 ENCOUNTER — Emergency Department (HOSPITAL_COMMUNITY)
Admission: EM | Admit: 2013-10-21 | Discharge: 2013-10-21 | Disposition: A | Discharge status: Home / Self Care | Payer: BC Managed Care – PPO | Attending: Emergency Medicine | Admitting: Emergency Medicine

## 2013-10-21 ENCOUNTER — Encounter (HOSPITAL_COMMUNITY): Payer: Self-pay | Admitting: Emergency Medicine

## 2013-10-21 ENCOUNTER — Emergency Department (HOSPITAL_COMMUNITY): Payer: BC Managed Care – PPO

## 2013-10-21 DIAGNOSIS — Y92410 Unspecified street and highway as the place of occurrence of the external cause: Secondary | ICD-10-CM | POA: Insufficient documentation

## 2013-10-21 DIAGNOSIS — IMO0002 Reserved for concepts with insufficient information to code with codable children: Secondary | ICD-10-CM | POA: Insufficient documentation

## 2013-10-21 DIAGNOSIS — Z880 Allergy status to penicillin: Secondary | ICD-10-CM | POA: Insufficient documentation

## 2013-10-21 DIAGNOSIS — Z8742 Personal history of other diseases of the female genital tract: Secondary | ICD-10-CM | POA: Insufficient documentation

## 2013-10-21 DIAGNOSIS — T07XXXA Unspecified multiple injuries, initial encounter: Secondary | ICD-10-CM

## 2013-10-21 DIAGNOSIS — Y9389 Activity, other specified: Secondary | ICD-10-CM | POA: Insufficient documentation

## 2013-10-21 DIAGNOSIS — S79919A Unspecified injury of unspecified hip, initial encounter: Secondary | ICD-10-CM | POA: Insufficient documentation

## 2013-10-21 DIAGNOSIS — S79929A Unspecified injury of unspecified thigh, initial encounter: Secondary | ICD-10-CM

## 2013-10-21 DIAGNOSIS — I10 Essential (primary) hypertension: Secondary | ICD-10-CM | POA: Insufficient documentation

## 2013-10-21 DIAGNOSIS — Z8619 Personal history of other infectious and parasitic diseases: Secondary | ICD-10-CM | POA: Insufficient documentation

## 2013-10-21 DIAGNOSIS — Z87891 Personal history of nicotine dependence: Secondary | ICD-10-CM | POA: Insufficient documentation

## 2013-10-21 DIAGNOSIS — Z79899 Other long term (current) drug therapy: Secondary | ICD-10-CM | POA: Insufficient documentation
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
## 2013-12-15 ENCOUNTER — Other Ambulatory Visit: Payer: Self-pay | Admitting: Internal Medicine

## 2013-12-18 ENCOUNTER — Encounter: Payer: Self-pay | Admitting: Internal Medicine

## 2013-12-18 ENCOUNTER — Ambulatory Visit (INDEPENDENT_AMBULATORY_CARE_PROVIDER_SITE_OTHER): Payer: BC Managed Care – PPO | Admitting: Internal Medicine

## 2013-12-18 VITALS — BP 150/94 | HR 76 | Temp 98.7°F | Ht 64.0 in | Wt 200.0 lb

## 2013-12-18 DIAGNOSIS — I10 Essential (primary) hypertension: Secondary | ICD-10-CM

## 2013-12-18 MED ORDER — CHLORTHALIDONE 25 MG PO TABS: 25.0000 mg | ORAL_TABLET | Freq: Every day | ORAL | Status: DC

## 2013-12-18 MED ORDER — POTASSIUM CHLORIDE CRYS ER 20 MEQ PO TBCR: 20.0000 meq | EXTENDED_RELEASE_TABLET | Freq: Every day | ORAL | Status: DC

## 2013-12-18 NOTE — Progress Notes (Signed)
Pre visit review using our clinic review tool, if applicable. No additional management support is needed unless otherwise documented below in the visit note. 

## 2013-12-18 NOTE — Progress Notes (Signed)
   Subjective:    Patient ID: Kelsey Cameron, female    DOB: Nov 01, 1980, 33 y.o.   MRN: [id number]  HPI  33 year old [place] American female with history of hypertension for followup. Patient has been taking hydrochlorothiazide 12.5 mg. Despite taking her blood pressure medication her home blood pressure readings still elevated between 329 and 924 systolic. Patient reports following healthy diet and walking on a regular basis.  She stopped taking Depo-Provera due to weight gain.  She has family history of hypertension.  Review of Systems Negative for chest pain    Past Medical History  Diagnosis Date  . PONV (postoperative nausea and vomiting)   . Macromastia   . STD (sexually transmitted disease) 2002    CHALMYDIA  . Hypertension     History   Social History  . Marital Status: Single    Spouse Name: N/A    Number of Children: N/A  . Years of Education: N/A   Occupational History  . Not on file.   Social History Main Topics  . Smoking status: Former Smoker    Types: Cigarettes  . Smokeless tobacco: Never Used     Comment: quit smoking 10/2010  . Alcohol Use: Yes     Comment: 1 drink/month  . Drug Use: No  . Sexual Activity: No   Other Topics Concern  . Not on file   Social History Narrative  . No narrative on file    Past Surgical History  Procedure Laterality Date  . Cholecystectomy  2009  . Breast reduction surgery  05/17/2011    Procedure: MAMMARY REDUCTION BILATERAL (BREAST);  Surgeon: Mary A Contogiannis;  Location: [facility];  Service: Plastics;  Laterality: Bilateral;  . Cesarean section  09/25/2006    Family History  Problem Relation Age of Onset  . Aneurysm Mother   . Hypertension Mother   . Breast cancer Mother 59  . Aneurysm Father   . Hypertension Father   . Heart disease Father   . Breast cancer Maternal Aunt   . Breast cancer Maternal Grandmother     Allergies  Allergen Reactions  . Amoxicillin Anaphylaxis and  Hives    Current Outpatient Prescriptions on File Prior to Visit  Medication Sig Dispense Refill  . EPINEPHrine (EPIPEN) 0.3 mg/0.3 mL SOAJ injection Inject 0.3 mLs (0.3 mg total) into the muscle once.  1 Device  1  . Multiple Vitamin (MULTIVITAMIN) capsule Take 1 capsule by mouth daily.         No current facility-administered medications on file prior to visit.    BP 150/94  Pulse 76  Temp(Src) 98.7 F (37.1 C) (Oral)  Ht 5\' 4"  (1.626 m)  Wt 200 lb (90.719 kg)  BMI 34.31 kg/m2    Objective:   Physical Exam  Constitutional: She is oriented to person, place, and time. She appears well-developed and well-nourished.  HENT:  Head: Normocephalic and atraumatic.  Cardiovascular: Normal rate, regular rhythm and normal heart sounds.   No murmur heard. Pulmonary/Chest: Effort normal and breath sounds normal. She has no wheezes.  Musculoskeletal: She exhibits no edema.  Neurological: She is alert and oriented to person, place, and time.  Skin: Skin is warm and dry.          Assessment & Plan:

## 2013-12-18 NOTE — Patient Instructions (Addendum)
Please complete the following lab tests before your next follow up appointment: BMET, [email or web address]  [email or web address] Regular aerobic exercise may help lower your cholesterol

## 2013-12-18 NOTE — Assessment & Plan Note (Signed)
BP is suboptimally controlled.  Switch to chlorthalidone 25 mg once daily. Start potassium chloride 20 mEq once daily. Monitor electrolytes and kidney function in 2 weeks.  BP: 150/94 mmHg   Patient encouraged to follow-diet and worked towards more vigorous aerobic exercise.

## 2014-02-21 ENCOUNTER — Telehealth: Payer: Self-pay | Admitting: Internal Medicine

## 2014-02-21 ENCOUNTER — Ambulatory Visit: Payer: BC Managed Care – PPO | Admitting: Internal Medicine

## 2014-02-21 NOTE — Telephone Encounter (Signed)
Pt needs a new rx chlorthalidone 25 mg #30 w/refills sent to [facility]. Pt had to rsc her appt today due to md out of office.

## 2014-02-21 NOTE — Telephone Encounter (Signed)
Spoke with pharmacy and there is a refill of chlorthalidone and patient is aware.

## 2014-03-24 ENCOUNTER — Ambulatory Visit (INDEPENDENT_AMBULATORY_CARE_PROVIDER_SITE_OTHER): Payer: BC Managed Care – PPO | Admitting: Family Medicine

## 2014-03-24 ENCOUNTER — Ambulatory Visit: Payer: BC Managed Care – PPO | Admitting: Internal Medicine

## 2014-03-24 ENCOUNTER — Encounter: Payer: Self-pay | Admitting: Family Medicine

## 2014-03-24 VITALS — BP 120/84 | Temp 98.5°F | Wt 203.0 lb

## 2014-03-24 DIAGNOSIS — B9789 Other viral agents as the cause of diseases classified elsewhere: Secondary | ICD-10-CM

## 2014-03-24 DIAGNOSIS — Z23 Encounter for immunization: Secondary | ICD-10-CM

## 2014-03-24 DIAGNOSIS — J069 Acute upper respiratory infection, unspecified: Secondary | ICD-10-CM

## 2014-03-24 DIAGNOSIS — I159 Secondary hypertension, unspecified: Secondary | ICD-10-CM

## 2014-03-24 MED ORDER — HYDROCODONE-HOMATROPINE 5-1.5 MG/5ML PO SYRP: ORAL_SOLUTION | ORAL | Status: DC

## 2014-03-24 NOTE — Progress Notes (Signed)
   Subjective:    Patient ID: Kelsey Cameron, female    DOB: Jul 05, 1980, 33 y.o.   MRN: [id number]  HPI Kelsey Cameron is a 33 year old female who comes in today for evaluation of 2 issues  She was diagnosed by Kelseyyoo to have hypertension. She was initially placed on hydrochlorothiazide but didn't like it and was switched to chlorthalidone. She might have either and stopped them and stopped her potassium. She's been off her medicine now for couple weeks. BP today 120/84  Her mother and her grandfather and grandmother all of hypertension father does not.  For the past 5 days she's had head congestion postnasal drip and cough. No fever no sputum production   Review of Systems Review of systems otherwise negative.......Marland Kitchen occupation she is in a lot of your for [employer]    Objective:   Physical Exam  Well-developed well-nourished female no acute distress vital signs stable she is afebrile HEENT was negative except for a lot of postnasal drip neck was supple no adenopathy lungs are clear  BP right arm sitting position 120/80      Assessment & Plan:  Question hypertension......... continue to hold medications........... BP check every morning...Marland KitchenMarland KitchenMarland Kitchen followup of elevated  Viral syndrome with cough......... treat symptomatically with cough syrup.

## 2014-03-24 NOTE — Progress Notes (Signed)
Pre visit review using our clinic review tool, if applicable. No additional management support is needed unless otherwise documented below in the visit note. 

## 2014-03-24 NOTE — Patient Instructions (Signed)
Omron pump up digital blood pressure cuff,,,,,,,,,,,,, [employer]  Check your blood pressure weekly  Issue get an elevated reading and take it daily for 2 weeks......... if after 2 weeks your blood pressures all normal then go back to check it once weekly  If after 2 weeks her blood pressures are all elevated and bring the device and the data and come back and see your doctor  For your viral infection drink lots of liquids....... do not take any over-the-counter medication...Marland KitchenMarland KitchenMarland Kitchen Tylenol when necessary....... Hydromet 1/2-1 teaspoon at bedtime for nighttime cough......Marland Kitchen Zyrtec 10 mg plain one at bedtime.

## 2014-04-21 ENCOUNTER — Encounter: Payer: Self-pay | Admitting: Family Medicine

## 2014-06-05 ENCOUNTER — Encounter: Payer: Self-pay | Admitting: Women's Health

## 2014-06-05 ENCOUNTER — Ambulatory Visit (INDEPENDENT_AMBULATORY_CARE_PROVIDER_SITE_OTHER): Payer: BC Managed Care – PPO | Admitting: Women's Health

## 2014-06-05 VITALS — BP 124/80 | Ht 64.0 in | Wt 201.0 lb

## 2014-06-05 DIAGNOSIS — Z113 Encounter for screening for infections with a predominantly sexual mode of transmission: Secondary | ICD-10-CM

## 2014-06-05 NOTE — Progress Notes (Signed)
Patient ID: Kelsey Cameron, female   DOB: Mar 04, 1981, 33 y.o.   MRN: [id number] [facility] requesting STD screen. Same partner with negative screen January 2015. Condoms. Monthly cycle. Had been on Depo-Provera had weight gain, stopped. Reports partner age 13 able to have an erection, unable to ejaculate and questions if that is related to a STD. Denies urinary symptoms, vaginal discharge, abdominal pain or fever. Scheduled to graduate [employer] in May.  Exam: Appears well. External genitalia within normal limits, speculum exam moderate amount of menses noted, GC/Chlamydia culture taken. Bimanual no tenderness or fullness noted.  STD screen  Plan: Will check HIV, hepatitis RPR at annual exam  in January. GC/Chlamydia culture pending. Reviewed importance of partner getting medical checkup or issue may be related to medications he may be on.

## 2014-06-06 LAB — GC/CHLAMYDIA PROBE AMP
CT Probe RNA: NEGATIVE
GC Probe RNA: NEGATIVE

## 2014-06-27 ENCOUNTER — Ambulatory Visit (INDEPENDENT_AMBULATORY_CARE_PROVIDER_SITE_OTHER): Payer: BLUE CROSS/BLUE SHIELD | Admitting: Women's Health

## 2014-06-27 ENCOUNTER — Other Ambulatory Visit (HOSPITAL_COMMUNITY)
Admission: RE | Admit: 2014-06-27 | Discharge: 2014-06-27 | Disposition: A | Discharge status: Home / Self Care | Payer: BLUE CROSS/BLUE SHIELD | Source: Ambulatory Visit | Attending: Gynecology | Admitting: Gynecology

## 2014-06-27 ENCOUNTER — Encounter: Payer: Self-pay | Admitting: Women's Health

## 2014-06-27 VITALS — BP 128/80 | Ht 64.0 in | Wt 205.0 lb

## 2014-06-27 DIAGNOSIS — Z01419 Encounter for gynecological examination (general) (routine) without abnormal findings: Secondary | ICD-10-CM | POA: Diagnosis not present

## 2014-06-27 DIAGNOSIS — Z113 Encounter for screening for infections with a predominantly sexual mode of transmission: Secondary | ICD-10-CM

## 2014-06-27 DIAGNOSIS — Z833 Family history of diabetes mellitus: Secondary | ICD-10-CM

## 2014-06-27 DIAGNOSIS — Z13220 Encounter for screening for lipoid disorders: Secondary | ICD-10-CM

## 2014-06-27 LAB — CBC WITH DIFFERENTIAL/PLATELET
BASOS ABS: 0 10*3/uL (ref 0.0–0.1)
BASOS PCT: 0 % (ref 0–1)
EOS PCT: 4 % (ref 0–5)
Eosinophils Absolute: 0.4 10*3/uL (ref 0.0–0.7)
HCT: 41.4 % (ref 36.0–46.0)
HEMOGLOBIN: 13.8 g/dL (ref 12.0–15.0)
LYMPHS ABS: 3 10*3/uL (ref 0.7–4.0)
Lymphocytes Relative: 31 % (ref 12–46)
MCH: 31.3 pg (ref 26.0–34.0)
MCHC: 33.3 g/dL (ref 30.0–36.0)
MCV: 93.9 fL (ref 78.0–100.0)
MONOS PCT: 5 % (ref 3–12)
MPV: 11.3 fL (ref 8.6–12.4)
Monocytes Absolute: 0.5 10*3/uL (ref 0.1–1.0)
Neutro Abs: 5.9 10*3/uL (ref 1.7–7.7)
Neutrophils Relative %: 60 % (ref 43–77)
Platelets: 215 10*3/uL (ref 150–400)
RBC: 4.41 MIL/uL (ref 3.87–5.11)
RDW: 14.3 % (ref 11.5–15.5)
WBC: 9.8 10*3/uL (ref 4.0–10.5)

## 2014-06-27 LAB — GLUCOSE, RANDOM: GLUCOSE: 91 mg/dL (ref 70–99)

## 2014-06-27 LAB — LIPID PANEL
Cholesterol: 159 mg/dL (ref 0–200)
HDL: 47 mg/dL (ref >39)
LDL Cholesterol: 101 mg/dL — ABNORMAL HIGH (ref 0–99)
TRIGLYCERIDES: 57 mg/dL (ref <150)
Total CHOL/HDL Ratio: 3.4 Ratio
VLDL: 11 mg/dL (ref 0–40)

## 2014-06-27 LAB — RPR

## 2014-06-27 NOTE — Progress Notes (Signed)
Kelsey Cameron [birth year] [id number]    History:    Presents for annual exam. Stopped taking DEPO a couple months ago due to weight gain. Walks most days decreasing calories.  Not currently sexually active/ condoms. Negative gonorrhea chlamydia 05/2014. Monthly cycles. Normal Pap history.  Past medical history, past surgical history, family history and social history were all reviewed and documented in the EPIC chart. 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 36 yo Daughter Kelsey Cameron healthy. healthy. Hypertension in both parents. Heart disease in father. Mother breast cancer survivor, age 36, BRCA status unknown.   ROS:  A ROS was performed and pertinent positives and negatives are included.  Exam:  Filed Vitals:   06/27/14 1021  BP: 128/80    General appearance:  Normal Thyroid:  Symmetrical, normal in size, without palpable masses or nodularity. Respiratory  Auscultation:  Clear without wheezing or rhonchi Cardiovascular  Auscultation:  Regular rate, without rubs, murmurs or gallops  Edema/varicosities:  Not grossly evident Abdominal  Soft,nontender, without masses, guarding or rebound.  Liver/spleen:  No organomegaly noted  Hernia:  None appreciated  Skin  Inspection:    grossly normal.   Breasts: Examined lying and sitting, scarring from B/L breast reduction.    Right: Without masses, retractions, discharge or axillary adenopathy.     Left: Without masses, retractions, discharge or axillary adenopathy. Gentitourinary   Inguinal/mons:  Normal without inguinal adenopathy  External genitalia:  Normal  BUS/Urethra/Skene's glands:  Normal  Vagina:  Normal  Cervix:  Normal  Uterus:  Normal in size, shape and contour.  Midline and mobile  Adnexa/parametria:     Rt: Without masses or tenderness.   Lt: Without masses or tenderness.  Anus and perineum: Normal    Assessment/Plan:  34 y.o.  SBF for annual exam.    Normal GYN exam STD screen   Plan: Contraception  options reviewed and declined, condoms. SBE's, MVI, decrease calories, increase exercise for weight loss encouraged.  Reviewed guidelines for mammogram to begin at age 36 due to mother's history of breast cancer at 72. due to mother's history of breast cancer at 36. Pap normal 2013, Pap with HR-HPV, reviewed new guidelines. HIV, hep B, C, RPR, CBC, lipid panel, UA, glucose.    Huel Cote [facility], 11:36 AM 06/27/2014

## 2014-06-27 NOTE — Patient Instructions (Signed)
Health Maintenance Adopting a healthy lifestyle and getting preventive care can go a long way to promote health and wellness. Talk with your health care provider about what schedule of regular examinations is right for you. This is a good chance for you to check in with your provider about disease prevention and staying healthy. In between checkups, there are plenty of things you can do on your own. Experts have done a lot of research about which lifestyle changes and preventive measures are most likely to keep you healthy. Ask your health care provider for more information. WEIGHT AND DIET  Eat a healthy diet  Be sure to include plenty of vegetables, fruits, low-fat dairy products, and lean protein.  Do not eat a lot of foods high in solid fats, added sugars, or salt.  Get regular exercise. This is one of the most important things you can do for your health.  Most adults should exercise for at least 150 minutes each week. The exercise should increase your heart rate and make you sweat (moderate-intensity exercise).  Most adults should also do strengthening exercises at least twice a week. This is in addition to the moderate-intensity exercise.  Maintain a healthy weight  Body mass index (BMI) is a measurement that can be used to identify possible weight problems. It estimates body fat based on height and weight. Your health care provider can help determine your BMI and help you achieve or maintain a healthy weight.  For females 29 years of age and older:   A BMI below 18.5 is considered underweight.  A BMI of 18.5 to 24.9 is normal.  A BMI of 25 to 29.9 is considered overweight.  A BMI of 30 and above is considered obese.  Watch levels of cholesterol and blood lipids  You should start having your blood tested for lipids and cholesterol at 36 years of age, then have this test every 5 years.  You may need to have your cholesterol levels checked more often if:  Your lipid or  cholesterol levels are high.  You are older than 36 years of age.  You are at high risk for heart disease.  CANCER SCREENING   Lung Cancer  Lung cancer screening is recommended for adults 55-68 years old who are at high risk for lung cancer because of a history of smoking.  A yearly low-dose CT scan of the lungs is recommended for people who:  Currently smoke.  Have quit within the past 15 years.  Have at least a 30-pack-year history of smoking. A pack year is smoking an average of one pack of cigarettes a day for 1 year.  Yearly screening should continue until it has been 15 years since you quit.  Yearly screening should stop if you develop a health problem that would prevent you from having lung cancer treatment.  Breast Cancer  Practice breast self-awareness. This means understanding how your breasts normally appear and feel.  It also means doing regular breast self-exams. Let your health care provider know about any changes, no matter how small.  If you are in your 20s or 30s, you should have a clinical breast exam (CBE) by a health care provider every 1-3 years as part of a regular health exam.  If you are 61 or older, have a CBE every year. Also consider having a breast X-ray (mammogram) every year.  If you have a family history of breast cancer, talk to your health care provider about genetic screening.  If you are  at high risk for breast cancer, talk to your health care provider about having an MRI and a mammogram every year.  Breast cancer gene (BRCA) assessment is recommended for women who have family members with BRCA-related cancers. BRCA-related cancers include:  Breast.  Ovarian.  Tubal.  Peritoneal cancers.  Results of the assessment will determine the need for genetic counseling and BRCA1 and BRCA2 testing. Cervical Cancer Routine pelvic examinations to screen for cervical cancer are no longer recommended for nonpregnant women who are considered low  risk for cancer of the pelvic organs (ovaries, uterus, and vagina) and who do not have symptoms. A pelvic examination may be necessary if you have symptoms including those associated with pelvic infections. Ask your health care provider if a screening pelvic exam is right for you.   The Pap test is the screening test for cervical cancer for women who are considered at risk.  If you had a hysterectomy for a problem that was not cancer or a condition that could lead to cancer, then you no longer need Pap tests.  If you are older than 65 years, and you have had normal Pap tests for the past 10 years, you no longer need to have Pap tests.  If you have had past treatment for cervical cancer or a condition that could lead to cancer, you need Pap tests and screening for cancer for at least 20 years after your treatment.  If you no longer get a Pap test, assess your risk factors if they change (such as having a new sexual partner). This can affect whether you should start being screened again.  Some women have medical problems that increase their chance of getting cervical cancer. If this is the case for you, your health care provider may recommend more frequent screening and Pap tests.  The human papillomavirus (HPV) test is another test that may be used for cervical cancer screening. The HPV test looks for the virus that can cause cell changes in the cervix. The cells collected during the Pap test can be tested for HPV.  The HPV test can be used to screen women 30 years of age and older. Getting tested for HPV can extend the interval between normal Pap tests from three to five years.  An HPV test also should be used to screen women of any age who have unclear Pap test results.  After 36 years of age, women should have HPV testing as often as Pap tests.  Colorectal Cancer  This type of cancer can be detected and often prevented.  Routine colorectal cancer screening usually begins at 36 years of  age and continues through 36 years of age.  Your health care provider may recommend screening at an earlier age if you have risk factors for colon cancer.  Your health care provider may also recommend using home test kits to check for hidden blood in the stool.  A small camera at the end of a tube can be used to examine your colon directly (sigmoidoscopy or colonoscopy). This is done to check for the earliest forms of colorectal cancer.  Routine screening usually begins at age 50.  Direct examination of the colon should be repeated every 5-10 years through 36 years of age. However, you may need to be screened more often if early forms of precancerous polyps or small growths are found. Skin Cancer  Check your skin from head to toe regularly.  Tell your health care provider about any new moles or changes in   moles, especially if there is a change in a mole's shape or color.  Also tell your health care provider if you have a mole that is larger than the size of a pencil eraser.  Always use sunscreen. Apply sunscreen liberally and repeatedly throughout the day.  Protect yourself by wearing long sleeves, pants, a wide-brimmed hat, and sunglasses whenever you are outside. HEART DISEASE, DIABETES, AND HIGH BLOOD PRESSURE   Have your blood pressure checked at least every 1-2 years. High blood pressure causes heart disease and increases the risk of stroke.  If you are between 14 years and 3 years old, ask your health care provider if you should take aspirin to prevent strokes.  Have regular diabetes screenings. This involves taking a blood sample to check your fasting blood sugar level.  If you are at a normal weight and have a low risk for diabetes, have this test once every three years after 36 years of age.  If you are overweight and have a high risk for diabetes, consider being tested at a younger age or more often. PREVENTING INFECTION  Hepatitis B  If you have a higher risk for  hepatitis B, you should be screened for this virus. You are considered at high risk for hepatitis B if:  You were born in a country where hepatitis B is common. Ask your health care provider which countries are considered high risk.  Your parents were born in a high-risk country, and you have not been immunized against hepatitis B (hepatitis B vaccine).  You have HIV or AIDS.  You use needles to inject street drugs.  You live with someone who has hepatitis B.  You have had sex with someone who has hepatitis B.  You get hemodialysis treatment.  You take certain medicines for conditions, including cancer, organ transplantation, and autoimmune conditions. Hepatitis C  Blood testing is recommended for:  Everyone born from [birth year] through 1967.  Anyone with known risk factors for hepatitis C. Sexually transmitted infections (STIs)  You should be screened for sexually transmitted infections (STIs) including gonorrhea and chlamydia if:  You are sexually active and are younger than 36 years of age.  You are older than 36 years of age and your health care provider tells you that you are at risk for this type of infection.  Your sexual activity has changed since you were last screened and you are at an increased risk for chlamydia or gonorrhea. Ask your health care provider if you are at risk.  If you do not have HIV, but are at risk, it may be recommended that you take a prescription medicine daily to prevent HIV infection. This is called pre-exposure prophylaxis (PrEP). You are considered at risk if:  You are sexually active and do not regularly use condoms or know the HIV status of your partner(s).  You take drugs by injection.  You are sexually active with a partner who has HIV. Talk with your health care provider about whether you are at high risk of being infected with HIV. If you choose to begin PrEP, you should first be tested for HIV. You should then be tested every 3 months for  as long as you are taking PrEP.  PREGNANCY   If you are premenopausal and you may become pregnant, ask your health care provider about preconception counseling.  If you may become pregnant, take 400 to 800 micrograms (mcg) of folic acid every day.  If you want to prevent pregnancy, talk to your  health care provider about birth control (contraception). OSTEOPOROSIS AND MENOPAUSE   Osteoporosis is a disease in which the bones lose minerals and strength with aging. This can result in serious bone fractures. Your risk for osteoporosis can be identified using a bone density scan.  If you are 52 years of age or older, or if you are at risk for osteoporosis and fractures, ask your health care provider if you should be screened.  Ask your health care provider whether you should take a calcium or vitamin D supplement to lower your risk for osteoporosis.  Menopause may have certain physical symptoms and risks.  Hormone replacement therapy may reduce some of these symptoms and risks. Talk to your health care provider about whether hormone replacement therapy is right for you.  HOME CARE INSTRUCTIONS   Schedule regular health, dental, and eye exams.  Stay current with your immunizations.   Do not use any tobacco products including cigarettes, chewing tobacco, or electronic cigarettes.  If you are pregnant, do not drink alcohol.  If you are breastfeeding, limit how much and how often you drink alcohol.  Limit alcohol intake to no more than 1 drink per day for nonpregnant women. One drink equals 12 ounces of beer, 5 ounces of wine, or 1 ounces of hard liquor.  Do not use street drugs.  Do not share needles.  Ask your health care provider for help if you need support or information about quitting drugs.  Tell your health care provider if you often feel depressed.  Tell your health care provider if you have ever been abused or do not feel safe at home. Document Released: 12/20/2010  Document Revised: 10/21/2013 Document Reviewed: 05/08/2013 [facility] Patient Information 2015 [place], [place]. This information is not intended to replace advice given to you by your health care provider. Make sure you discuss any questions you have with your health care provider. Basic Carbohydrate Counting for Diabetes Mellitus Carbohydrate counting is a method for keeping track of the amount of carbohydrates you eat. Eating carbohydrates naturally increases the level of sugar (glucose) in your blood, so it is important for you to know the amount that is okay for you to have in every meal. Carbohydrate counting helps keep the level of glucose in your blood within normal limits. The amount of carbohydrates allowed is different for every person. A dietitian can help you calculate the amount that is right for you. Once you know the amount of carbohydrates you can have, you can count the carbohydrates in the foods you want to eat. Carbohydrates are found in the following foods:  Grains, such as breads and cereals.  Dried beans and soy products.  Starchy vegetables, such as potatoes, peas, and corn.  Fruit and fruit juices.  Milk and yogurt.  Sweets and snack foods, such as cake, cookies, candy, chips, soft drinks, and fruit drinks. CARBOHYDRATE COUNTING There are two ways to count the carbohydrates in your food. You can use either of the methods or a combination of both. Reading the "Nutrition Facts" on [place] The "Nutrition Facts" is an area that is included on the labels of almost all packaged food and beverages in the [place]. It includes the serving size of that food or beverage and information about the nutrients in each serving of the food, including the grams (g) of carbohydrate per serving.  Decide the number of servings of this food or beverage that you will be able to eat or drink. Multiply that number of servings  by the number of grams of carbohydrate that is listed on the  label for that serving. The total will be the amount of carbohydrates you will be having when you eat or drink this food or beverage. Learning Standard Serving Sizes of Food When you eat food that is not packaged or does not include "Nutrition Facts" on the label, you need to measure the servings in order to count the amount of carbohydrates.A serving of most carbohydrate-rich foods contains about 15 g of carbohydrates. The following list includes serving sizes of carbohydrate-rich foods that provide 15 g ofcarbohydrate per serving:   1 slice of bread (1 oz) or 1 six-inch tortilla.    of a hamburger bun or English muffin.  4-6 crackers.   cup unsweetened dry cereal.    cup hot cereal.   cup rice or pasta.    cup mashed potatoes or  of a large baked potato.  1 cup fresh fruit or one small piece of fruit.    cup canned or frozen fruit or fruit juice.  1 cup milk.   cup plain fat-free yogurt or yogurt sweetened with artificial sweeteners.   cup cooked dried beans or starchy vegetable, such as peas, corn, or potatoes.  Decide the number of standard-size servings that you will eat. Multiply that number of servings by 15 (the grams of carbohydrates in that serving). For example, if you eat 2 cups of strawberries, you will have eaten 2 servings and 30 g of carbohydrates (2 servings x 15 g = 30 g). For foods such as soups and casseroles, in which more than one food is mixed in, you will need to count the carbohydrates in each food that is included. EXAMPLE OF CARBOHYDRATE COUNTING Sample Dinner  3 oz chicken breast.   cup of brown rice.   cup of corn.  1 cup milk.   1 cup strawberries with sugar-free whipped topping.  Carbohydrate Calculation Step 1: Identify the foods that contain carbohydrates:   Rice.   Corn.   Milk.   Strawberries. Step 2:Calculate the number of servings eaten of each:   2 servings of rice.   1 serving of corn.   1 serving  of milk.   1 serving of strawberries. Step 3: Multiply each of those number of servings by 15 g:   2 servings of rice x 15 g = 30 g.   1 serving of corn x 15 g = 15 g.   1 serving of milk x 15 g = 15 g.   1 serving of strawberries x 15 g = 15 g. Step 4: Add together all of the amounts to find the total grams of carbohydrates eaten: 30 g + 15 g + 15 g + 15 g = 75 g. Document Released: 06/06/2005 Document Revised: 10/21/2013 Document Reviewed: 05/03/2013 [facility] Patient Information 2015 [place], [place]. This information is not intended to replace advice given to you by your health care provider. Make sure you discuss any questions you have with your health care provider.

## 2014-06-28 LAB — URINALYSIS W MICROSCOPIC + REFLEX CULTURE
BILIRUBIN URINE: NEGATIVE
Casts: NONE SEEN
Glucose, UA: NEGATIVE mg/dL
HGB URINE DIPSTICK: NEGATIVE
Ketones, ur: NEGATIVE mg/dL
Nitrite: NEGATIVE
PH: 6 (ref 5.0–8.0)
Protein, ur: NEGATIVE mg/dL
SPECIFIC GRAVITY, URINE: 1.03 (ref 1.005–1.030)
UROBILINOGEN UA: 1 mg/dL (ref 0.0–1.0)

## 2014-06-28 LAB — HEPATITIS B SURFACE ANTIGEN: HEP B S AG: NEGATIVE

## 2014-06-28 LAB — HEPATITIS C ANTIBODY: HCV Ab: NEGATIVE

## 2014-06-28 LAB — HIV ANTIBODY (ROUTINE TESTING W REFLEX): HIV: NONREACTIVE

## 2014-06-30 LAB — URINE CULTURE: Colony Count: 15000

## 2014-06-30 LAB — CYTOLOGY - PAP

## 2014-12-06 ENCOUNTER — Emergency Department (HOSPITAL_COMMUNITY): Payer: BLUE CROSS/BLUE SHIELD

## 2014-12-06 ENCOUNTER — Emergency Department (HOSPITAL_COMMUNITY)
Admission: EM | Admit: 2014-12-06 | Discharge: 2014-12-06 | Disposition: A | Discharge status: Home / Self Care | Payer: BLUE CROSS/BLUE SHIELD | Attending: Emergency Medicine | Admitting: Emergency Medicine

## 2014-12-06 ENCOUNTER — Encounter (HOSPITAL_COMMUNITY): Payer: Self-pay | Admitting: Emergency Medicine

## 2014-12-06 DIAGNOSIS — Y92410 Unspecified street and highway as the place of occurrence of the external cause: Secondary | ICD-10-CM | POA: Insufficient documentation

## 2014-12-06 DIAGNOSIS — Z8619 Personal history of other infectious and parasitic diseases: Secondary | ICD-10-CM | POA: Insufficient documentation

## 2014-12-06 DIAGNOSIS — Z79899 Other long term (current) drug therapy: Secondary | ICD-10-CM | POA: Insufficient documentation

## 2014-12-06 DIAGNOSIS — Z87891 Personal history of nicotine dependence: Secondary | ICD-10-CM | POA: Diagnosis not present

## 2014-12-06 DIAGNOSIS — S4992XA Unspecified injury of left shoulder and upper arm, initial encounter: Secondary | ICD-10-CM | POA: Insufficient documentation

## 2014-12-06 DIAGNOSIS — Y9389 Activity, other specified: Secondary | ICD-10-CM | POA: Insufficient documentation

## 2014-12-06 DIAGNOSIS — Z880 Allergy status to penicillin: Secondary | ICD-10-CM | POA: Insufficient documentation

## 2014-12-06 DIAGNOSIS — Z8742 Personal history of other diseases of the female genital tract: Secondary | ICD-10-CM | POA: Diagnosis not present

## 2014-12-06 DIAGNOSIS — M25512 Pain in left shoulder: Secondary | ICD-10-CM

## 2014-12-06 DIAGNOSIS — I10 Essential (primary) hypertension: Secondary | ICD-10-CM | POA: Diagnosis not present

## 2014-12-06 DIAGNOSIS — Y998 Other external cause status: Secondary | ICD-10-CM | POA: Insufficient documentation

## 2014-12-06 MED ORDER — OXYCODONE-ACETAMINOPHEN 5-325 MG PO TABS: 2.0000 | ORAL_TABLET | ORAL | Status: DC | PRN

## 2014-12-06 MED ORDER — METHOCARBAMOL 500 MG PO TABS: 500.0000 mg | ORAL_TABLET | Freq: Two times a day (BID) | ORAL | Status: DC

## 2014-12-06 MED ORDER — KETOROLAC TROMETHAMINE 30 MG/ML IJ SOLN
30.0000 mg | Freq: Once | INTRAMUSCULAR | Status: AC
  Administered 2014-12-06: 30 mg via INTRAMUSCULAR
  Filled 2014-12-06: qty 1

## 2014-12-06 MED ORDER — NAPROXEN 500 MG PO TABS: 500.0000 mg | ORAL_TABLET | Freq: Two times a day (BID) | ORAL | Status: DC
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
## 2015-02-09 ENCOUNTER — Ambulatory Visit (INDEPENDENT_AMBULATORY_CARE_PROVIDER_SITE_OTHER): Payer: BLUE CROSS/BLUE SHIELD | Admitting: Adult Health

## 2015-02-09 ENCOUNTER — Encounter: Payer: Self-pay | Admitting: Adult Health

## 2015-02-09 VITALS — BP 130/90 | Temp 98.9°F | Ht 64.0 in | Wt 211.8 lb

## 2015-02-09 DIAGNOSIS — IMO0001 Reserved for inherently not codable concepts without codable children: Secondary | ICD-10-CM

## 2015-02-09 DIAGNOSIS — K219 Gastro-esophageal reflux disease without esophagitis: Secondary | ICD-10-CM | POA: Diagnosis not present

## 2015-02-09 DIAGNOSIS — R030 Elevated blood-pressure reading, without diagnosis of hypertension: Secondary | ICD-10-CM

## 2015-02-09 NOTE — Patient Instructions (Addendum)
Take OTC omeprazole before eating foods that will make your have acid reflux ( red meats, spicy/acidic foods). The best thing to do is change your diet so that you do not have to take medications.   Your blood pressure was a little elevated today in the office. Follow up with Dr. Shawna Orleans in two months to recheck this. Working on diet and exercise will help lower your blood pressure.    Food Choices for Gastroesophageal Reflux Disease When you have gastroesophageal reflux disease (GERD), the foods you eat and your eating habits are very important. Choosing the right foods can help ease the discomfort of GERD. WHAT GENERAL GUIDELINES DO I NEED TO FOLLOW?  Choose fruits, vegetables, whole grains, low-fat dairy products, and low-fat meat, fish, and poultry.  Limit fats such as oils, salad dressings, butter, nuts, and avocado.  Keep a food diary to identify foods that cause symptoms.  Avoid foods that cause reflux. These may be different for different people.  Eat frequent small meals instead of three large meals each day.  Eat your meals slowly, in a relaxed setting.  Limit fried foods.  Cook foods using methods other than frying.  Avoid drinking alcohol.  Avoid drinking large amounts of liquids with your meals.  Avoid bending over or lying down until 2-3 hours after eating. WHAT FOODS ARE NOT RECOMMENDED? The following are some foods and drinks that may worsen your symptoms: Vegetables Tomatoes. Tomato juice. Tomato and spaghetti sauce. Chili peppers. Onion and garlic. Horseradish. Fruits Oranges, grapefruit, and lemon (fruit and juice). Meats High-fat meats, fish, and poultry. This includes hot dogs, ribs, ham, sausage, salami, and bacon. Dairy Whole milk and chocolate milk. Sour cream. Cream. Butter. Ice cream. Cream cheese.  Beverages Coffee and tea, with or without caffeine. Carbonated beverages or energy drinks. Condiments Hot sauce. Barbecue sauce.   Sweets/Desserts Chocolate and cocoa. Donuts. Peppermint and spearmint. Fats and Oils High-fat foods, including [place] fries and potato chips. Other Vinegar. Strong spices, such as black pepper, white pepper, red pepper, cayenne, curry powder, cloves, ginger, and chili powder. The items listed above may not be a complete list of foods and beverages to avoid. Contact your dietitian for more information. Document Released: 06/06/2005 Document Revised: 06/11/2013 Document Reviewed: 04/10/2013 [facility] Patient Information 2015 [place], [place]. This information is not intended to replace advice given to you by your health care provider. Make sure you discuss any questions you have with your health care provider. Low-Sodium Eating Plan Sodium raises blood pressure and causes water to be held in the body. Getting less sodium from food will help lower your blood pressure, reduce any swelling, and protect your heart, liver, and kidneys. We get sodium by adding salt (sodium chloride) to food. Most of our sodium comes from canned, boxed, and frozen foods. Restaurant foods, fast foods, and pizza are also very high in sodium. Even if you take medicine to lower your blood pressure or to reduce fluid in your body, getting less sodium from your food is important. WHAT IS MY PLAN? Most people should limit their sodium intake to 2,300 mg a day. Your health care provider recommends that you limit your sodium intake to __________ a day.  WHAT DO I NEED TO KNOW ABOUT THIS EATING PLAN? For the low-sodium eating plan, you will follow these general guidelines:  Choose foods with a % Daily Value for sodium of less than 5% (as listed on the food label).   Use salt-free seasonings or herbs instead of table  salt or sea salt.   Check with your health care provider or pharmacist before using salt substitutes.   Eat fresh foods.  Eat more vegetables and fruits.  Limit canned vegetables. If you do use them, rinse them  well to decrease the sodium.   Limit cheese to 1 oz (28 g) per day.   Eat lower-sodium products, often labeled as "lower sodium" or "no salt added."  Avoid foods that contain monosodium glutamate (MSG). MSG is sometimes added to [place] food and some canned foods.  Check food labels (Nutrition Facts labels) on foods to learn how much sodium is in one serving.  Eat more home-cooked food and less restaurant, buffet, and fast food.  When eating at a restaurant, ask that your food be prepared with less salt or none, if possible.  HOW DO I READ FOOD LABELS FOR SODIUM INFORMATION? The Nutrition Facts label lists the amount of sodium in one serving of the food. If you eat more than one serving, you must multiply the listed amount of sodium by the number of servings. Food labels may also identify foods as:  Sodium free--Less than 5 mg in a serving.  Very low sodium--35 mg or less in a serving.  Low sodium--140 mg or less in a serving.  Light in sodium--50% less sodium in a serving. For example, if a food that usually has 300 mg of sodium is changed to become light in sodium, it will have 150 mg of sodium.  Reduced sodium--25% less sodium in a serving. For example, if a food that usually has 400 mg of sodium is changed to reduced sodium, it will have 300 mg of sodium. WHAT FOODS CAN I EAT? Grains Low-sodium cereals, including oats, puffed wheat and rice, and shredded wheat cereals. Low-sodium crackers. Unsalted rice and pasta. Lower-sodium bread.  Vegetables Frozen or fresh vegetables. Low-sodium or reduced-sodium canned vegetables. Low-sodium or reduced-sodium tomato sauce and paste. Low-sodium or reduced-sodium tomato and vegetable juices.  Fruits Fresh, frozen, and canned fruit. Fruit juice.  Meat and Other Protein Products Low-sodium canned tuna and salmon. Fresh or frozen meat, poultry, seafood, and fish. Lamb. Unsalted nuts. Dried beans, peas, and lentils without added salt.  Unsalted canned beans. Homemade soups without salt. Eggs.  Dairy Milk. Soy milk. Ricotta cheese. Low-sodium or reduced-sodium cheeses. Yogurt.  Condiments Fresh and dried herbs and spices. Salt-free seasonings. Onion and garlic powders. Low-sodium varieties of mustard and ketchup. Lemon juice.  Fats and Oils Reduced-sodium salad dressings. Unsalted butter.  Other Unsalted popcorn and pretzels.  The items listed above may not be a complete list of recommended foods or beverages. Contact your dietitian for more options. WHAT FOODS ARE NOT RECOMMENDED? Grains Instant hot cereals. Bread stuffing, pancake, and biscuit mixes. Croutons. Seasoned rice or pasta mixes. Noodle soup cups. Boxed or frozen macaroni and cheese. Self-rising flour. Regular salted crackers. Vegetables Regular canned vegetables. Regular canned tomato sauce and paste. Regular tomato and vegetable juices. Frozen vegetables in sauces. Salted french fries. Olives. Angie Fava. Relishes. Sauerkraut. Salsa. Meat and Other Protein Products Salted, canned, smoked, spiced, or pickled meats, seafood, or fish. Bacon, ham, sausage, hot dogs, corned beef, chipped beef, and packaged luncheon meats. Salt pork. Jerky. Pickled herring. Anchovies, regular canned tuna, and sardines. Salted nuts. Dairy Processed cheese and cheese spreads. Cheese curds. Blue cheese and cottage cheese. Buttermilk.  Condiments Onion and garlic salt, seasoned salt, table salt, and sea salt. Canned and packaged gravies. Worcestershire sauce. Tartar sauce. Barbecue sauce. Teriyaki sauce. Soy sauce,  including reduced sodium. Steak sauce. Fish sauce. Oyster sauce. Cocktail sauce. Horseradish. Regular ketchup and mustard. Meat flavorings and tenderizers. Bouillon cubes. Hot sauce. Tabasco sauce. Marinades. Taco seasonings. Relishes. Fats and Oils Regular salad dressings. Salted butter. Margarine. Ghee. Bacon fat.  Other Potato and tortilla chips. Corn chips and  puffs. Salted popcorn and pretzels. Canned or dried soups. Pizza. Frozen entrees and pot pies.  The items listed above may not be a complete list of foods and beverages to avoid. Contact your dietitian for more information. Document Released: 11/26/2001 Document Revised: 06/11/2013 Document Reviewed: 04/10/2013 [facility] Patient Information 2015 [place], [place]. This information is not intended to replace advice given to you by your health care provider. Make sure you discuss any questions you have with your health care provider.

## 2015-02-09 NOTE — Progress Notes (Signed)
Pre visit review using our clinic review tool, if applicable. No additional management support is needed unless otherwise documented below in the visit note. 

## 2015-02-09 NOTE — Progress Notes (Addendum)
Subjective:    Patient ID: Kelsey Cameron, female    DOB: 01-11-1981, 34 y.o.   MRN: [id number]  HPI  34 year old female who presents to the office today for GERD like symptoms. She had a steak and hamburger last week and then had acid reflux after eating. She notices that when she eats red sauce or spicy foods that she also gets acid reflux. She had taken an omeprazole after eating the red meat and " felt a millions times better."   Denies waking up with a sour taste in her mouth. No nausea or vomiting.   Her blood pressure is  elevated today in the office. She endorses that on occasion when she checks it at home or drug store that it has been in the 120's-130's. She is no longer taking chlorthalidone.     Review of Systems  Constitutional: Negative.   HENT: Negative.   Respiratory: Negative.   Cardiovascular: Negative.   Gastrointestinal: Negative.  Negative for vomiting.  Neurological: Negative.   All other systems reviewed and are negative.  Past Medical History  Diagnosis Date  . PONV (postoperative nausea and vomiting)   . Macromastia   . STD (sexually transmitted disease) 2002    CHALMYDIA  . Hypertension     Social History   Social History  . Marital Status: Single    Spouse Name: N/A  . Number of Children: N/A  . Years of Education: N/A   Occupational History  . Not on file.   Social History Main Topics  . Smoking status: Former Smoker    Types: Cigarettes  . Smokeless tobacco: Never Used     Comment: quit smoking 10/2010  . Alcohol Use: No     Comment: 1 drink/month  . Drug Use: No  . Sexual Activity: No     Comment: INTERCOUSRE AGE 63,SEXUAL PARTNERS MORE THAN 5    Other Topics Concern  . Not on file   Social History Narrative    Past Surgical History  Procedure Laterality Date  . Cholecystectomy  2009  . Breast reduction surgery  05/17/2011    Procedure: MAMMARY REDUCTION BILATERAL (BREAST);  Surgeon: Mary A Contogiannis;  Location:  [place];  Service: Plastics;  Laterality: Bilateral;  . Cesarean section  09/25/2006    Family History  Problem Relation Age of Onset  . Aneurysm Mother   . Hypertension Mother   . Breast cancer Mother 73  . Aneurysm Father   . Hypertension Father   . Heart disease Father   . Breast cancer Maternal Aunt   . Breast cancer Maternal Grandmother     Allergies  Allergen Reactions  . Amoxicillin Anaphylaxis and Hives    Current Outpatient Prescriptions on File Prior to Visit  Medication Sig Dispense Refill  . metroNIDAZOLE (METROGEL) 0.75 % vaginal gel as directed.  1  . Multiple Vitamin (MULTIVITAMIN) capsule Take 1 capsule by mouth daily.      Marland Kitchen EPINEPHrine (EPIPEN) 0.3 mg/0.3 mL SOAJ injection Inject 0.3 mLs (0.3 mg total) into the muscle once. (Patient not taking: Reported on 02/09/2015) 1 Device 1   No current facility-administered medications on file prior to visit.    BP 130/90 mmHg  Temp(Src) 98.9 F (37.2 C) (Oral)  Ht 5\' 4"  (1.626 m)  Wt 211 lb 12.8 oz (96.072 kg)  BMI 36.34 kg/m2  LMP 01/20/2015       Objective:   Physical Exam  Constitutional: She is oriented to person,  place, and time. She appears well-developed and well-nourished. No distress.  Cardiovascular: Normal rate, regular rhythm, normal heart sounds and intact distal pulses.  Exam reveals no friction rub.   No murmur heard. Pulmonary/Chest: Effort normal and breath sounds normal. No respiratory distress. She has no wheezes. She has no rales. She exhibits no tenderness.  Abdominal: Soft. Bowel sounds are normal. She exhibits no distension and no mass. There is no tenderness. There is no rebound and no guarding.  Neurological: She is alert and oriented to person, place, and time.  Skin: Skin is warm and dry. No rash noted. She is not diaphoretic. No erythema. No pallor.  Psychiatric: She has a normal mood and affect. Her behavior is normal. Judgment and thought content normal.  Nursing  note and vitals reviewed.      Assessment & Plan:  1. Gastroesophageal reflux disease, esophagitis presence not specified - Change diet and stay away from high acidic/spicey foods.  - Take OTC omeprazole as needed  2. Elevated BP - Heart healthy, low sodium diet and start exercising.  - Follow up with Dr. Shawna Orleans in 1-2 months for recheck.  - She does not want to take medication for her elevated blood pressure at this time.

## 2015-04-22 ENCOUNTER — Encounter: Payer: Self-pay | Admitting: Women's Health

## 2015-04-22 ENCOUNTER — Ambulatory Visit (INDEPENDENT_AMBULATORY_CARE_PROVIDER_SITE_OTHER): Payer: BLUE CROSS/BLUE SHIELD | Admitting: Women's Health

## 2015-04-22 VITALS — BP 118/80 | Ht 64.0 in | Wt 211.0 lb

## 2015-04-22 DIAGNOSIS — Z23 Encounter for immunization: Secondary | ICD-10-CM

## 2015-04-22 DIAGNOSIS — Z113 Encounter for screening for infections with a predominantly sexual mode of transmission: Secondary | ICD-10-CM

## 2015-04-22 DIAGNOSIS — A499 Bacterial infection, unspecified: Secondary | ICD-10-CM

## 2015-04-22 DIAGNOSIS — N926 Irregular menstruation, unspecified: Secondary | ICD-10-CM

## 2015-04-22 DIAGNOSIS — N760 Acute vaginitis: Secondary | ICD-10-CM | POA: Diagnosis not present

## 2015-04-22 DIAGNOSIS — B9689 Other specified bacterial agents as the cause of diseases classified elsewhere: Secondary | ICD-10-CM

## 2015-04-22 LAB — WET PREP FOR TRICH, YEAST, CLUE
Trich, Wet Prep: NONE SEEN
YEAST WET PREP: NONE SEEN

## 2015-04-22 LAB — TSH: TSH: 1.15 u[IU]/mL (ref 0.350–4.500)

## 2015-04-22 MED ORDER — METRONIDAZOLE 500 MG PO TABS: 500.0000 mg | ORAL_TABLET | Freq: Two times a day (BID) | ORAL | Status: DC

## 2015-04-22 NOTE — Progress Notes (Signed)
Presents today for irregular cycles x 2 months. Reports 2-3 cycles per month since September. States the cycles have been heavier and more painful than normal. Quit Depo in 12/2013 due to weight gain, amenorrheic after. Had tried OCPs to regulate cycles at that time with relief, then discontinued. Same sexual partner/condoms, last sexual activity several months ago. Denies any vaginal itching, odor, abdominal pain, or urinary symptoms.   Exam: Appears well. External genitalia appear within normal limits. Speculum exam reveals no erythema, inflammation, or lesions. Scant white discharge present. No tenderness with exam. Wet prep shows moderate clue cells, TNTC bacteria, and rare WBCs  Bacterial vaginosis Irregular cycles STD screen  Plan: Flagyl 500 mg tablet by mouth twice a day for 7 days. Alcohol precautions reviewed. Use condoms every time. TSH, GC/Chlamydia, Hep B, Hep C, HIV, and RPR. Recommended to keep menstrual diary, call if less than 21 days fromday 1 to day 1. Call office if symptoms do not resolve or worsen.

## 2015-04-22 NOTE — Patient Instructions (Signed)

## 2015-04-23 LAB — GC/CHLAMYDIA PROBE AMP
CT PROBE, AMP APTIMA: NEGATIVE
GC Probe RNA: NEGATIVE

## 2015-04-23 LAB — HEPATITIS C ANTIBODY: HCV Ab: NEGATIVE

## 2015-04-23 LAB — HEPATITIS B SURFACE ANTIGEN: Hepatitis B Surface Ag: NEGATIVE

## 2015-04-23 LAB — HIV ANTIBODY (ROUTINE TESTING W REFLEX): HIV: NONREACTIVE

## 2015-04-23 LAB — RPR

## 2015-05-06 ENCOUNTER — Telehealth: Payer: Self-pay | Admitting: *Deleted

## 2015-05-06 MED ORDER — FLUCONAZOLE 150 MG PO TABS: 150.0000 mg | ORAL_TABLET | Freq: Once | ORAL | Status: DC

## 2015-05-06 NOTE — Telephone Encounter (Signed)
Pt aware Rx sent.  

## 2015-05-06 NOTE — Telephone Encounter (Signed)
Pt was treated for BV infection on OV 04/22/15 with Flagyl 500 mg now has yeast infection. C/o itching and white discharge, requesting a diflucan tablet. Please advise

## 2015-05-06 NOTE — Telephone Encounter (Signed)
Okay, please call in Diflucan 150 with refill office visit if no relief.

## 2015-06-30 ENCOUNTER — Encounter: Payer: BLUE CROSS/BLUE SHIELD | Admitting: Women's Health

## 2015-07-09 ENCOUNTER — Ambulatory Visit (INDEPENDENT_AMBULATORY_CARE_PROVIDER_SITE_OTHER): Payer: BLUE CROSS/BLUE SHIELD | Admitting: Women's Health

## 2015-07-09 ENCOUNTER — Encounter: Payer: Self-pay | Admitting: Women's Health

## 2015-07-09 VITALS — BP 128/80 | Ht 64.0 in | Wt 219.0 lb

## 2015-07-09 DIAGNOSIS — Z01419 Encounter for gynecological examination (general) (routine) without abnormal findings: Secondary | ICD-10-CM

## 2015-07-09 DIAGNOSIS — A499 Bacterial infection, unspecified: Secondary | ICD-10-CM | POA: Diagnosis not present

## 2015-07-09 DIAGNOSIS — B9689 Other specified bacterial agents as the cause of diseases classified elsewhere: Secondary | ICD-10-CM

## 2015-07-09 DIAGNOSIS — Z13220 Encounter for screening for lipoid disorders: Secondary | ICD-10-CM

## 2015-07-09 DIAGNOSIS — Z833 Family history of diabetes mellitus: Secondary | ICD-10-CM

## 2015-07-09 DIAGNOSIS — N760 Acute vaginitis: Secondary | ICD-10-CM

## 2015-07-09 DIAGNOSIS — N898 Other specified noninflammatory disorders of vagina: Secondary | ICD-10-CM

## 2015-07-09 LAB — LIPID PANEL
Cholesterol: 145 mg/dL (ref 125–200)
HDL: 39 mg/dL — AB (ref >=46)
LDL Cholesterol: 83 mg/dL (ref <130)
Total CHOL/HDL Ratio: 3.7 Ratio (ref <=5.0)
Triglycerides: 114 mg/dL (ref <150)
VLDL: 23 mg/dL (ref <30)

## 2015-07-09 LAB — CBC WITH DIFFERENTIAL/PLATELET
BASOS PCT: 0 % (ref 0–1)
Basophils Absolute: 0 10*3/uL (ref 0.0–0.1)
EOS ABS: 0.3 10*3/uL (ref 0.0–0.7)
Eosinophils Relative: 3 % (ref 0–5)
HCT: 40.1 % (ref 36.0–46.0)
Hemoglobin: 13.3 g/dL (ref 12.0–15.0)
Lymphocytes Relative: 29 % (ref 12–46)
Lymphs Abs: 2.5 10*3/uL (ref 0.7–4.0)
MCH: 30.9 pg (ref 26.0–34.0)
MCHC: 33.2 g/dL (ref 30.0–36.0)
MCV: 93.3 fL (ref 78.0–100.0)
MPV: 11.8 fL (ref 8.6–12.4)
Monocytes Absolute: 0.5 10*3/uL (ref 0.1–1.0)
Monocytes Relative: 6 % (ref 3–12)
NEUTROS ABS: 5.4 10*3/uL (ref 1.7–7.7)
NEUTROS PCT: 62 % (ref 43–77)
PLATELETS: 221 10*3/uL (ref 150–400)
RBC: 4.3 MIL/uL (ref 3.87–5.11)
RDW: 14.7 % (ref 11.5–15.5)
WBC: 8.7 10*3/uL (ref 4.0–10.5)

## 2015-07-09 LAB — WET PREP FOR TRICH, YEAST, CLUE
Trich, Wet Prep: NONE SEEN
YEAST WET PREP: NONE SEEN

## 2015-07-09 LAB — GLUCOSE, RANDOM: GLUCOSE: 89 mg/dL (ref 65–99)

## 2015-07-09 MED ORDER — FLUCONAZOLE 150 MG PO TABS
150.0000 mg | ORAL_TABLET | Freq: Once | ORAL | Status: DC
Start: 2015-07-09 — End: 2015-09-07

## 2015-07-09 MED ORDER — METRONIDAZOLE 0.75 % VA GEL: VAGINAL | Status: DC

## 2015-07-09 NOTE — Progress Notes (Signed)
Kelsey Cameron [birth year] [id number]    History:    Presents for annual exam.  Monthly cycle/condoms, currently not sexually active. Negative STD screen 04/2015. Mother breast cancer at age 37 survivor BRCA status unknown. Normal Pap history.  Past medical history, past surgical history, family history and social history were all reviewed and documented in the EPIC chart. 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 Daughter 8 doing well.  ROS:  A ROS was performed and pertinent positives and negatives are included.  Exam:  Filed Vitals:   07/09/15 1012  BP: 128/80    General appearance:  Normal Thyroid:  Symmetrical, normal in size, without palpable masses or nodularity. Respiratory  Auscultation:  Clear without wheezing or rhonchi Cardiovascular  Auscultation:  Regular rate, without rubs, murmurs or gallops  Edema/varicosities:  Not grossly evident Abdominal  Soft,nontender, without masses, guarding or rebound.  Liver/spleen:  No organomegaly noted  Hernia:  None appreciated  Skin  Inspection:  Grossly normal   Breasts: Examined lying and sitting/breast reduction    Right: Without masses, retractions, discharge or axillary adenopathy.     Left: Without masses, retractions, discharge or axillary adenopathy. Gentitourinary   Inguinal/mons:  Normal without inguinal adenopathy  External genitalia:  Normal  BUS/Urethra/Skene's glands:  Normal  Vagina:  Outer white adherent discharge, wet prep positive for many clues, TNTC bacteria  Cervix:  Normal  Uterus:   normal in size, shape and contour.  Midline and mobile  Adnexa/parametria:     Rt: Without masses or tenderness.   Lt: Without masses or tenderness.  Anus and perineum: Normal  Digital rectal exam: Normal sphincter tone without palpated masses or tenderness  Assessment/Plan:  35 y.o. SBF G1 P1 for annual exam with complaint of occasional discharge.  Monthly cycle/condoms Bacteria vaginosis Obesity  Plan: Contraception  options reviewed and declined will use condoms. Plan B emergency contraception reviewed. MetroGel vaginal cream 1 applicator at bedtime 5, alcohol precautions reviewed, call if no relief of discharge. SBE's,  start annual screening mammogram at 4. Continue active lifestyle, regular exercise and decrease carbs for weight loss encouraged. MVI daily encouraged. CBC, lipid panel, CMP, UA, Pap normal 2016, new screening guidelines reviewed.    Huel Cote [facility], 11:42 AM 07/09/2015

## 2015-07-09 NOTE — Patient Instructions (Addendum)
Health Maintenance, Female Adopting a healthy lifestyle and getting preventive care can go a long way to promote health and wellness. Talk with your health care provider about what schedule of regular examinations is right for you. This is a good chance for you to check in with your provider about disease prevention and staying healthy. In between checkups, there are plenty of things you can do on your own. Experts have done a lot of research about which lifestyle changes and preventive measures are most likely to keep you healthy. Ask your health care provider for more information. WEIGHT AND DIET  Eat a healthy diet  Be sure to include plenty of vegetables, fruits, low-fat dairy products, and lean protein.  Do not eat a lot of foods high in solid fats, added sugars, or salt.  Get regular exercise. This is one of the most important things you can do for your health.  Most adults should exercise for at least 150 minutes each week. The exercise should increase your heart rate and make you sweat (moderate-intensity exercise).  Most adults should also do strengthening exercises at least twice a week. This is in addition to the moderate-intensity exercise.  Maintain a healthy weight  Body mass index (BMI) is a measurement that can be used to identify possible weight problems. It estimates body fat based on height and weight. Your health care provider can help determine your BMI and help you achieve or maintain a healthy weight.  For females 20 years of age and older:   A BMI below 18.5 is considered underweight.  A BMI of 18.5 to 24.9 is normal.  A BMI of 25 to 29.9 is considered overweight.  A BMI of 30 and above is considered obese.  Watch levels of cholesterol and blood lipids  You should start having your blood tested for lipids and cholesterol at 37 years of age, then have this test every 5 years.  You may need to have your cholesterol levels checked more often if:  Your lipid  or cholesterol levels are high.  You are older than 37 years of age.  You are at high risk for heart disease.  CANCER SCREENING   Lung Cancer  Lung cancer screening is recommended for adults 55-80 years old who are at high risk for lung cancer because of a history of smoking.  A yearly low-dose CT scan of the lungs is recommended for people who:  Currently smoke.  Have quit within the past 15 years.  Have at least a 30-pack-year history of smoking. A pack year is smoking an average of one pack of cigarettes a day for 1 year.  Yearly screening should continue until it has been 15 years since you quit.  Yearly screening should stop if you develop a health problem that would prevent you from having lung cancer treatment.  Breast Cancer  Practice breast self-awareness. This means understanding how your breasts normally appear and feel.  It also means doing regular breast self-exams. Let your health care provider know about any changes, no matter how small.  If you are in your 20s or 30s, you should have a clinical breast exam (CBE) by a health care provider every 1-3 years as part of a regular health exam.  If you are 40 or older, have a CBE every year. Also consider having a breast X-ray (mammogram) every year.  If you have a family history of breast cancer, talk to your health care provider about genetic screening.  If you   are at high risk for breast cancer, talk to your health care provider about having an MRI and a mammogram every year.  Breast cancer gene (BRCA) assessment is recommended for women who have family members with BRCA-related cancers. BRCA-related cancers include:  Breast.  Ovarian.  Tubal.  Peritoneal cancers.  Results of the assessment will determine the need for genetic counseling and BRCA1 and BRCA2 testing. Cervical Cancer Your health care provider may recommend that you be screened regularly for cancer of the pelvic organs (ovaries, uterus, and  vagina). This screening involves a pelvic examination, including checking for microscopic changes to the surface of your cervix (Pap test). You may be encouraged to have this screening done every 3 years, beginning at age 21.  For women ages 30-65, health care providers may recommend pelvic exams and Pap testing every 3 years, or they may recommend the Pap and pelvic exam, combined with testing for human papilloma virus (HPV), every 5 years. Some types of HPV increase your risk of cervical cancer. Testing for HPV may also be done on women of any age with unclear Pap test results.  Other health care providers may not recommend any screening for nonpregnant women who are considered low risk for pelvic cancer and who do not have symptoms. Ask your health care provider if a screening pelvic exam is right for you.  If you have had past treatment for cervical cancer or a condition that could lead to cancer, you need Pap tests and screening for cancer for at least 20 years after your treatment. If Pap tests have been discontinued, your risk factors (such as having a new sexual partner) need to be reassessed to determine if screening should resume. Some women have medical problems that increase the chance of getting cervical cancer. In these cases, your health care provider may recommend more frequent screening and Pap tests. Colorectal Cancer  This type of cancer can be detected and often prevented.  Routine colorectal cancer screening usually begins at 37 years of age and continues through 37 years of age.  Your health care provider may recommend screening at an earlier age if you have risk factors for colon cancer.  Your health care provider may also recommend using home test kits to check for hidden blood in the stool.  A small camera at the end of a tube can be used to examine your colon directly (sigmoidoscopy or colonoscopy). This is done to check for the earliest forms of colorectal  cancer.  Routine screening usually begins at age 50.  Direct examination of the colon should be repeated every 5-10 years through 37 years of age. However, you may need to be screened more often if early forms of precancerous polyps or small growths are found. Skin Cancer  Check your skin from head to toe regularly.  Tell your health care provider about any new moles or changes in moles, especially if there is a change in a mole's shape or color.  Also tell your health care provider if you have a mole that is larger than the size of a pencil eraser.  Always use sunscreen. Apply sunscreen liberally and repeatedly throughout the day.  Protect yourself by wearing long sleeves, pants, a wide-brimmed hat, and sunglasses whenever you are outside. HEART DISEASE, DIABETES, AND HIGH BLOOD PRESSURE   High blood pressure causes heart disease and increases the risk of stroke. High blood pressure is more likely to develop in:  People who have blood pressure in the high end   of the normal range (130-139/85-89 mm Hg).  People who are overweight or obese.  People who are African American.  If you are 38-23 years of age, have your blood pressure checked every 3-5 years. If you are 61 years of age or older, have your blood pressure checked every year. You should have your blood pressure measured twice--once when you are at a hospital or clinic, and once when you are not at a hospital or clinic. Record the average of the two measurements. To check your blood pressure when you are not at a hospital or clinic, you can use:  An automated blood pressure machine at a pharmacy.  A home blood pressure monitor.  If you are between 45 years and 39 years old, ask your health care provider if you should take aspirin to prevent strokes.  Have regular diabetes screenings. This involves taking a blood sample to check your fasting blood sugar level.  If you are at a normal weight and have a low risk for diabetes,  have this test once every three years after 37 years of age.  If you are overweight and have a high risk for diabetes, consider being tested at a younger age or more often. PREVENTING INFECTION  Hepatitis B  If you have a higher risk for hepatitis B, you should be screened for this virus. You are considered at high risk for hepatitis B if:  You were born in a country where hepatitis B is common. Ask your health care provider which countries are considered high risk.  Your parents were born in a high-risk country, and you have not been immunized against hepatitis B (hepatitis B vaccine).  You have HIV or AIDS.  You use needles to inject street drugs.  You live with someone who has hepatitis B.  You have had sex with someone who has hepatitis B.  You get hemodialysis treatment.  You take certain medicines for conditions, including cancer, organ transplantation, and autoimmune conditions. Hepatitis C  Blood testing is recommended for:  Everyone born from [birth year] through 1967.  Anyone with known risk factors for hepatitis C. Sexually transmitted infections (STIs)  You should be screened for sexually transmitted infections (STIs) including gonorrhea and chlamydia if:  You are sexually active and are younger than 37 years of age.  You are older than 37 years of age and your health care provider tells you that you are at risk for this type of infection.  Your sexual activity has changed since you were last screened and you are at an increased risk for chlamydia or gonorrhea. Ask your health care provider if you are at risk.  If you do not have HIV, but are at risk, it may be recommended that you take a prescription medicine daily to prevent HIV infection. This is called pre-exposure prophylaxis (PrEP). You are considered at risk if:  You are sexually active and do not regularly use condoms or know the HIV status of your partner(s).  You take drugs by injection.  You are sexually  active with a partner who has HIV. Talk with your health care provider about whether you are at high risk of being infected with HIV. If you choose to begin PrEP, you should first be tested for HIV. You should then be tested every 3 months for as long as you are taking PrEP.  PREGNANCY   If you are premenopausal and you may become pregnant, ask your health care provider about preconception counseling.  If you may  become pregnant, take 400 to 800 micrograms (mcg) of folic acid every day.  If you want to prevent pregnancy, talk to your health care provider about birth control (contraception). OSTEOPOROSIS AND MENOPAUSE   Osteoporosis is a disease in which the bones lose minerals and strength with aging. This can result in serious bone fractures. Your risk for osteoporosis can be identified using a bone density scan.  If you are 42 years of age or older, or if you are at risk for osteoporosis and fractures, ask your health care provider if you should be screened.  Ask your health care provider whether you should take a calcium or vitamin D supplement to lower your risk for osteoporosis.  Menopause may have certain physical symptoms and risks.  Hormone replacement therapy may reduce some of these symptoms and risks. Talk to your health care provider about whether hormone replacement therapy is right for you.  HOME CARE INSTRUCTIONS   Schedule regular health, dental, and eye exams.  Stay current with your immunizations.   Do not use any tobacco products including cigarettes, chewing tobacco, or electronic cigarettes.  If you are pregnant, do not drink alcohol.  If you are breastfeeding, limit how much and how often you drink alcohol.  Limit alcohol intake to no more than 1 drink per day for nonpregnant women. One drink equals 12 ounces of beer, 5 ounces of wine, or 1 ounces of hard liquor.  Do not use street drugs.  Do not share needles.  Ask your health care provider for help if  you need support or information about quitting drugs.  Tell your health care provider if you often feel depressed.  Tell your health care provider if you have ever been abused or do not feel safe at home.   This information is not intended to replace advice given to you by your health care provider. Make sure you discuss any questions you have with your health care provider.   Document Released: 12/20/2010 Document Revised: 06/27/2014 Document Reviewed: 05/08/2013 Elsevier Interactive Patient Education 2016 [place] Carbohydrate Counting for Diabetes Mellitus Carbohydrate counting is a method for keeping track of the amount of carbohydrates you eat. Eating carbohydrates naturally increases the level of sugar (glucose) in your blood, so it is important for you to know the amount that is okay for you to have in every meal. Carbohydrate counting helps keep the level of glucose in your blood within normal limits. The amount of carbohydrates allowed is different for every person. A dietitian can help you calculate the amount that is right for you. Once you know the amount of carbohydrates you can have, you can count the carbohydrates in the foods you want to eat. Carbohydrates are found in the following foods:  Grains, such as breads and cereals.  Dried beans and soy products.  Starchy vegetables, such as potatoes, peas, and corn.  Fruit and fruit juices.  Milk and yogurt.  Sweets and snack foods, such as cake, cookies, candy, chips, soft drinks, and fruit drinks. CARBOHYDRATE COUNTING There are two ways to count the carbohydrates in your food. You can use either of the methods or a combination of both. Reading the "Nutrition Facts" on [place] The "Nutrition Facts" is an area that is included on the labels of almost all packaged food and beverages in the [place]. It includes the serving size of that food or beverage and information about the nutrients in each serving of  the food, including the grams (g)  of carbohydrate per serving.  Decide the number of servings of this food or beverage that you will be able to eat or drink. Multiply that number of servings by the number of grams of carbohydrate that is listed on the label for that serving. The total will be the amount of carbohydrates you will be having when you eat or drink this food or beverage. Learning Standard Serving Sizes of Food When you eat food that is not packaged or does not include "Nutrition Facts" on the label, you need to measure the servings in order to count the amount of carbohydrates.A serving of most carbohydrate-rich foods contains about 15 g of carbohydrates. The following list includes serving sizes of carbohydrate-rich foods that provide 15 g ofcarbohydrate per serving:   1 slice of bread (1 oz) or 1 six-inch tortilla.    of a hamburger bun or English muffin.  4-6 crackers.   cup unsweetened dry cereal.    cup hot cereal.   cup rice or pasta.    cup mashed potatoes or  of a large baked potato.  1 cup fresh fruit or one small piece of fruit.    cup canned or frozen fruit or fruit juice.  1 cup milk.   cup plain fat-free yogurt or yogurt sweetened with artificial sweeteners.   cup cooked dried beans or starchy vegetable, such as peas, corn, or potatoes.  Decide the number of standard-size servings that you will eat. Multiply that number of servings by 15 (the grams of carbohydrates in that serving). For example, if you eat 2 cups of strawberries, you will have eaten 2 servings and 30 g of carbohydrates (2 servings x 15 g = 30 g). For foods such as soups and casseroles, in which more than one food is mixed in, you will need to count the carbohydrates in each food that is included. EXAMPLE OF CARBOHYDRATE COUNTING Sample Dinner  3 oz chicken breast.   cup of brown rice.   cup of corn.  1 cup milk.   1 cup strawberries with sugar-free whipped topping.   Carbohydrate Calculation Step 1: Identify the foods that contain carbohydrates:   Rice.   Corn.   Milk.   Strawberries. Step 2:Calculate the number of servings eaten of each:   2 servings of rice.   1 serving of corn.   1 serving of milk.   1 serving of strawberries. Step 3: Multiply each of those number of servings by 15 g:   2 servings of rice x 15 g = 30 g.   1 serving of corn x 15 g = 15 g.   1 serving of milk x 15 g = 15 g.   1 serving of strawberries x 15 g = 15 g. Step 4: Add together all of the amounts to find the total grams of carbohydrates eaten: 30 g + 15 g + 15 g + 15 g = 75 g.   This information is not intended to replace advice given to you by your health care provider. Make sure you discuss any questions you have with your health care provider.   Document Released: 06/06/2005 Document Revised: 06/27/2014 Document Reviewed: 05/03/2013 Elsevier Interactive Patient Education 2016 [place] Maintenance, Female Adopting a healthy lifestyle and getting preventive care can go a long way to promote health and wellness. Talk with your health care provider about what schedule of regular examinations is right for you. This is a good chance for you to check in with  your provider about disease prevention and staying healthy. In between checkups, there are plenty of things you can do on your own. Experts have done a lot of research about which lifestyle changes and preventive measures are most likely to keep you healthy. Ask your health care provider for more information. WEIGHT AND DIET  Eat a healthy diet  Be sure to include plenty of vegetables, fruits, low-fat dairy products, and lean protein.  Do not eat a lot of foods high in solid fats, added sugars, or salt.  Get regular exercise. This is one of the most important things you can do for your health.  Most adults should exercise for at least 150 minutes each week. The exercise should  increase your heart rate and make you sweat (moderate-intensity exercise).  Most adults should also do strengthening exercises at least twice a week. This is in addition to the moderate-intensity exercise.  Maintain a healthy weight  Body mass index (BMI) is a measurement that can be used to identify possible weight problems. It estimates body fat based on height and weight. Your health care provider can help determine your BMI and help you achieve or maintain a healthy weight.  For females 60 years of age and older:   A BMI below 18.5 is considered underweight.  A BMI of 18.5 to 24.9 is normal.  A BMI of 25 to 29.9 is considered overweight.  A BMI of 30 and above is considered obese.  Watch levels of cholesterol and blood lipids  You should start having your blood tested for lipids and cholesterol at 37 years of age, then have this test every 5 years.  You may need to have your cholesterol levels checked more often if:  Your lipid or cholesterol levels are high.  You are older than 37 years of age.  You are at high risk for heart disease.  CANCER SCREENING   Lung Cancer  Lung cancer screening is recommended for adults 51-58 years old who are at high risk for lung cancer because of a history of smoking.  A yearly low-dose CT scan of the lungs is recommended for people who:  Currently smoke.  Have quit within the past 15 years.  Have at least a 30-pack-year history of smoking. A pack year is smoking an average of one pack of cigarettes a day for 1 year.  Yearly screening should continue until it has been 15 years since you quit.  Yearly screening should stop if you develop a health problem that would prevent you from having lung cancer treatment.  Breast Cancer  Practice breast self-awareness. This means understanding how your breasts normally appear and feel.  It also means doing regular breast self-exams. Let your health care provider know about any changes, no  matter how small.  If you are in your 20s or 30s, you should have a clinical breast exam (CBE) by a health care provider every 1-3 years as part of a regular health exam.  If you are 8 or older, have a CBE every year. Also consider having a breast X-ray (mammogram) every year.  If you have a family history of breast cancer, talk to your health care provider about genetic screening.  If you are at high risk for breast cancer, talk to your health care provider about having an MRI and a mammogram every year.  Breast cancer gene (BRCA) assessment is recommended for women who have family members with BRCA-related cancers. BRCA-related cancers include:  Breast.  Ovarian.  Tubal.  Peritoneal cancers.  Results of the assessment will determine the need for genetic counseling and BRCA1 and BRCA2 testing. Cervical Cancer Your health care provider may recommend that you be screened regularly for cancer of the pelvic organs (ovaries, uterus, and vagina). This screening involves a pelvic examination, including checking for microscopic changes to the surface of your cervix (Pap test). You may be encouraged to have this screening done every 3 years, beginning at age 31.  For women ages 19-65, health care providers may recommend pelvic exams and Pap testing every 3 years, or they may recommend the Pap and pelvic exam, combined with testing for human papilloma virus (HPV), every 5 years. Some types of HPV increase your risk of cervical cancer. Testing for HPV may also be done on women of any age with unclear Pap test results.  Other health care providers may not recommend any screening for nonpregnant women who are considered low risk for pelvic cancer and who do not have symptoms. Ask your health care provider if a screening pelvic exam is right for you.  If you have had past treatment for cervical cancer or a condition that could lead to cancer, you need Pap tests and screening for cancer for at least  20 years after your treatment. If Pap tests have been discontinued, your risk factors (such as having a new sexual partner) need to be reassessed to determine if screening should resume. Some women have medical problems that increase the chance of getting cervical cancer. In these cases, your health care provider may recommend more frequent screening and Pap tests. Colorectal Cancer  This type of cancer can be detected and often prevented.  Routine colorectal cancer screening usually begins at 37 years of age and continues through 37 years of age.  Your health care provider may recommend screening at an earlier age if you have risk factors for colon cancer.  Your health care provider may also recommend using home test kits to check for hidden blood in the stool.  A small camera at the end of a tube can be used to examine your colon directly (sigmoidoscopy or colonoscopy). This is done to check for the earliest forms of colorectal cancer.  Routine screening usually begins at age 35.  Direct examination of the colon should be repeated every 5-10 years through 37 years of age. However, you may need to be screened more often if early forms of precancerous polyps or small growths are found. Skin Cancer  Check your skin from head to toe regularly.  Tell your health care provider about any new moles or changes in moles, especially if there is a change in a mole's shape or color.  Also tell your health care provider if you have a mole that is larger than the size of a pencil eraser.  Always use sunscreen. Apply sunscreen liberally and repeatedly throughout the day.  Protect yourself by wearing long sleeves, pants, a wide-brimmed hat, and sunglasses whenever you are outside. HEART DISEASE, DIABETES, AND HIGH BLOOD PRESSURE   High blood pressure causes heart disease and increases the risk of stroke. High blood pressure is more likely to develop in:  People who have blood pressure in the high end  of the normal range (130-139/85-89 mm Hg).  People who are overweight or obese.  People who are African American.  If you are 36-17 years of age, have your blood pressure checked every 3-5 years. If you are 70 years of age or older, have your  blood pressure checked every year. You should have your blood pressure measured twice--once when you are at a hospital or clinic, and once when you are not at a hospital or clinic. Record the average of the two measurements. To check your blood pressure when you are not at a hospital or clinic, you can use:  An automated blood pressure machine at a pharmacy.  A home blood pressure monitor.  If you are between 36 years and 50 years old, ask your health care provider if you should take aspirin to prevent strokes.  Have regular diabetes screenings. This involves taking a blood sample to check your fasting blood sugar level.  If you are at a normal weight and have a low risk for diabetes, have this test once every three years after 37 years of age.  If you are overweight and have a high risk for diabetes, consider being tested at a younger age or more often. PREVENTING INFECTION  Hepatitis B  If you have a higher risk for hepatitis B, you should be screened for this virus. You are considered at high risk for hepatitis B if:  You were born in a country where hepatitis B is common. Ask your health care provider which countries are considered high risk.  Your parents were born in a high-risk country, and you have not been immunized against hepatitis B (hepatitis B vaccine).  You have HIV or AIDS.  You use needles to inject street drugs.  You live with someone who has hepatitis B.  You have had sex with someone who has hepatitis B.  You get hemodialysis treatment.  You take certain medicines for conditions, including cancer, organ transplantation, and autoimmune conditions. Hepatitis C  Blood testing is recommended for:  Everyone born from  [birth year] through 1967.  Anyone with known risk factors for hepatitis C. Sexually transmitted infections (STIs)  You should be screened for sexually transmitted infections (STIs) including gonorrhea and chlamydia if:  You are sexually active and are younger than 37 years of age.  You are older than 37 years of age and your health care provider tells you that you are at risk for this type of infection.  Your sexual activity has changed since you were last screened and you are at an increased risk for chlamydia or gonorrhea. Ask your health care provider if you are at risk.  If you do not have HIV, but are at risk, it may be recommended that you take a prescription medicine daily to prevent HIV infection. This is called pre-exposure prophylaxis (PrEP). You are considered at risk if:  You are sexually active and do not regularly use condoms or know the HIV status of your partner(s).  You take drugs by injection.  You are sexually active with a partner who has HIV. Talk with your health care provider about whether you are at high risk of being infected with HIV. If you choose to begin PrEP, you should first be tested for HIV. You should then be tested every 3 months for as long as you are taking PrEP.  PREGNANCY   If you are premenopausal and you may become pregnant, ask your health care provider about preconception counseling.  If you may become pregnant, take 400 to 800 micrograms (mcg) of folic acid every day.  If you want to prevent pregnancy, talk to your health care provider about birth control (contraception). OSTEOPOROSIS AND MENOPAUSE   Osteoporosis is a disease in which the bones lose minerals and strength with  aging. This can result in serious bone fractures. Your risk for osteoporosis can be identified using a bone density scan.  If you are 58 years of age or older, or if you are at risk for osteoporosis and fractures, ask your health care provider if you should be screened.  Ask  your health care provider whether you should take a calcium or vitamin D supplement to lower your risk for osteoporosis.  Menopause may have certain physical symptoms and risks.  Hormone replacement therapy may reduce some of these symptoms and risks. Talk to your health care provider about whether hormone replacement therapy is right for you.  HOME CARE INSTRUCTIONS   Schedule regular health, dental, and eye exams.  Stay current with your immunizations.   Do not use any tobacco products including cigarettes, chewing tobacco, or electronic cigarettes.  If you are pregnant, do not drink alcohol.  If you are breastfeeding, limit how much and how often you drink alcohol.  Limit alcohol intake to no more than 1 drink per day for nonpregnant women. One drink equals 12 ounces of beer, 5 ounces of wine, or 1 ounces of hard liquor.  Do not use street drugs.  Do not share needles.  Ask your health care provider for help if you need support or information about quitting drugs.  Tell your health care provider if you often feel depressed.  Tell your health care provider if you have ever been abused or do not feel safe at home.   This information is not intended to replace advice given to you by your health care provider. Make sure you discuss any questions you have with your health care provider.   Document Released: 12/20/2010 Document Revised: 06/27/2014 Document Reviewed: 05/08/2013 Elsevier Interactive Patient Education [employer].

## 2015-07-10 LAB — URINALYSIS W MICROSCOPIC + REFLEX CULTURE
BACTERIA UA: NONE SEEN [HPF]
Bilirubin Urine: NEGATIVE
Casts: NONE SEEN [LPF]
Crystals: NONE SEEN [HPF]
GLUCOSE, UA: NEGATIVE
Hgb urine dipstick: NEGATIVE
Ketones, ur: NEGATIVE
Leukocytes, UA: NEGATIVE
NITRITE: NEGATIVE
PROTEIN: NEGATIVE
Specific Gravity, Urine: 1.026 (ref 1.001–1.035)
Yeast: NONE SEEN [HPF]
pH: 5.5 (ref 5.0–8.0)

## 2015-07-11 LAB — URINE CULTURE
Colony Count: NO GROWTH
ORGANISM ID, BACTERIA: NO GROWTH

## 2015-09-07 ENCOUNTER — Ambulatory Visit (INDEPENDENT_AMBULATORY_CARE_PROVIDER_SITE_OTHER): Payer: BLUE CROSS/BLUE SHIELD | Admitting: Adult Health

## 2015-09-07 ENCOUNTER — Encounter: Payer: Self-pay | Admitting: Adult Health

## 2015-09-07 VITALS — BP 120/80 | HR 72 | Temp 98.3°F | Wt 217.0 lb

## 2015-09-07 DIAGNOSIS — J0100 Acute maxillary sinusitis, unspecified: Secondary | ICD-10-CM

## 2015-09-07 MED ORDER — DOXYCYCLINE HYCLATE 100 MG PO CAPS: 100.0000 mg | ORAL_CAPSULE | Freq: Two times a day (BID) | ORAL | Status: DC

## 2015-09-07 NOTE — Progress Notes (Signed)
Subjective:    Patient ID: Kelsey Cameron, female    DOB: 1980/11/01, 35 y.o.   MRN: [id number]  Sinusitis This is a new problem. The current episode started 1 to 4 weeks ago. The problem has been waxing and waning since onset. The pain is moderate. Associated symptoms include congestion, coughing, headaches and sinus pressure. Past treatments include oral decongestants. The treatment provided no relief.    Review of Systems  Constitutional: Positive for activity change and fatigue. Negative for fever.  HENT: Positive for congestion and sinus pressure.   Respiratory: Positive for cough.   Neurological: Positive for headaches.   Past Medical History  Diagnosis Date  . PONV (postoperative nausea and vomiting)   . Macromastia   . STD (sexually transmitted disease) 2002    CHALMYDIA  . Hypertension     Social History   Social History  . Marital Status: Single    Spouse Name: N/A  . Number of Children: N/A  . Years of Education: N/A   Occupational History  . Not on file.   Social History Main Topics  . Smoking status: Former Smoker    Types: Cigarettes  . Smokeless tobacco: Never Used     Comment: quit smoking 10/2010  . Alcohol Use: No     Comment: 1 drink/month  . Drug Use: No  . Sexual Activity: No     Comment: INTERCOUSRE AGE 71,SEXUAL PARTNERS MORE THAN 5    Other Topics Concern  . Not on file   Social History Narrative    Past Surgical History  Procedure Laterality Date  . Cholecystectomy  2009  . Breast reduction surgery  05/17/2011    Procedure: MAMMARY REDUCTION BILATERAL (BREAST);  Surgeon: Mary A Contogiannis;  Location: [facility];  Service: Plastics;  Laterality: Bilateral;  . Cesarean section  09/25/2006    Family History  Problem Relation Age of Onset  . Aneurysm Mother   . Hypertension Mother   . Breast cancer Mother 39  . Aneurysm Father   . Hypertension Father   . Heart disease Father   . Breast cancer Maternal Aunt     . Breast cancer Maternal Grandmother     Allergies  Allergen Reactions  . Amoxicillin Anaphylaxis and Hives    Current Outpatient Prescriptions on File Prior to Visit  Medication Sig Dispense Refill  . Multiple Vitamin (MULTIVITAMIN) capsule Take 1 capsule by mouth daily. Reported on 07/09/2015     No current facility-administered medications on file prior to visit.    BP 120/80 mmHg  Pulse 72  Temp(Src) 98.3 F (36.8 C) (Oral)  Wt 217 lb (98.431 kg)  SpO2 97%       Objective:   Physical Exam  Constitutional: She is oriented to person, place, and time. She appears well-developed and well-nourished. No distress.  HENT:  Head: Normocephalic.  Right Ear: Tympanic membrane, external ear and ear canal normal.  Left Ear: Hearing, tympanic membrane, external ear and ear canal normal.  Nose: Mucosal edema and rhinorrhea present.  Mouth/Throat: Oropharynx is clear and moist.  Cardiovascular: Normal rate, regular rhythm, normal heart sounds and intact distal pulses.  Exam reveals no gallop and no friction rub.   No murmur heard. Pulmonary/Chest: Effort normal and breath sounds normal. No respiratory distress. She has no wheezes. She has no rales. She exhibits no tenderness.  Abdominal: Soft. Bowel sounds are normal.  Neurological: She is alert and oriented to person, place, and time.  Skin: Skin is  warm and dry. No rash noted. She is not diaphoretic. No erythema. No pallor.  Psychiatric: She has a normal mood and affect. Her behavior is normal. Judgment and thought content normal.  Nursing note and vitals reviewed.     Assessment & Plan:  1. Acute maxillary sinusitis, recurrence not specified - doxycycline (VIBRAMYCIN) 100 MG capsule; Take 1 capsule (100 mg total) by mouth 2 (two) times daily.  Dispense: 14 capsule; Refill: 0 - Flonase/ Normal Saline Spray

## 2015-09-07 NOTE — Patient Instructions (Addendum)
It was great meeting you today!  Your exam is consistent with a sinus infection.   I have prescribed doxycycline, take this twice a day for 7 days.   Use Flonase or a normal saline nasal spray.   Follow up if no improvement.     Sinusitis, Adult Sinusitis is redness, soreness, and inflammation of the paranasal sinuses. Paranasal sinuses are air pockets within the bones of your face. They are located beneath your eyes, in the middle of your forehead, and above your eyes. In healthy paranasal sinuses, mucus is able to drain out, and air is able to circulate through them by way of your nose. However, when your paranasal sinuses are inflamed, mucus and air can become trapped. This can allow bacteria and other germs to grow and cause infection. Sinusitis can develop quickly and last only a short time (acute) or continue over a long period (chronic). Sinusitis that lasts for more than 12 weeks is considered chronic. CAUSES Causes of sinusitis include:  Allergies.  Structural abnormalities, such as displacement of the cartilage that separates your nostrils (deviated septum), which can decrease the air flow through your nose and sinuses and affect sinus drainage.  Functional abnormalities, such as when the small hairs (cilia) that line your sinuses and help remove mucus do not work properly or are not present. SIGNS AND SYMPTOMS Symptoms of acute and chronic sinusitis are the same. The primary symptoms are pain and pressure around the affected sinuses. Other symptoms include:  Upper toothache.  Earache.  Headache.  Bad breath.  Decreased sense of smell and taste.  A cough, which worsens when you are lying flat.  Fatigue.  Fever.  Thick drainage from your nose, which often is green and may contain pus (purulent).  Swelling and warmth over the affected sinuses. DIAGNOSIS Your health care provider will perform a physical exam. During your exam, your health care provider may perform  any of the following to help determine if you have acute sinusitis or chronic sinusitis:  Look in your nose for signs of abnormal growths in your nostrils (nasal polyps).  Tap over the affected sinus to check for signs of infection.  View the inside of your sinuses using an imaging device that has a light attached (endoscope). If your health care provider suspects that you have chronic sinusitis, one or more of the following tests may be recommended:  Allergy tests.  Nasal culture. A sample of mucus is taken from your nose, sent to a lab, and screened for bacteria.  Nasal cytology. A sample of mucus is taken from your nose and examined by your health care provider to determine if your sinusitis is related to an allergy. TREATMENT Most cases of acute sinusitis are related to a viral infection and will resolve on their own within 10 days. Sometimes, medicines are prescribed to help relieve symptoms of both acute and chronic sinusitis. These may include pain medicines, decongestants, nasal steroid sprays, or saline sprays. However, for sinusitis related to a bacterial infection, your health care provider will prescribe antibiotic medicines. These are medicines that will help kill the bacteria causing the infection. Rarely, sinusitis is caused by a fungal infection. In these cases, your health care provider will prescribe antifungal medicine. For some cases of chronic sinusitis, surgery is needed. Generally, these are cases in which sinusitis recurs more than 3 times per year, despite other treatments. HOME CARE INSTRUCTIONS  Drink plenty of water. Water helps thin the mucus so your sinuses can drain more  easily.  Use a humidifier.  Inhale steam 3-4 times a day (for example, sit in the bathroom with the shower running).  Apply a warm, moist washcloth to your face 3-4 times a day, or as directed by your health care provider.  Use saline nasal sprays to help moisten and clean your  sinuses.  Take medicines only as directed by your health care provider.  If you were prescribed either an antibiotic or antifungal medicine, finish it all even if you start to feel better. SEEK IMMEDIATE MEDICAL CARE IF:  You have increasing pain or severe headaches.  You have nausea, vomiting, or drowsiness.  You have swelling around your face.  You have vision problems.  You have a stiff neck.  You have difficulty breathing.   This information is not intended to replace advice given to you by your health care provider. Make sure you discuss any questions you have with your health care provider.   Document Released: 06/06/2005 Document Revised: 06/27/2014 Document Reviewed: 06/21/2011 Elsevier Interactive Patient Education [employer].

## 2015-10-21 ENCOUNTER — Other Ambulatory Visit: Payer: Self-pay | Admitting: Adult Health

## 2015-10-21 ENCOUNTER — Ambulatory Visit (INDEPENDENT_AMBULATORY_CARE_PROVIDER_SITE_OTHER): Payer: BLUE CROSS/BLUE SHIELD | Admitting: Adult Health

## 2015-10-21 ENCOUNTER — Encounter: Payer: Self-pay | Admitting: Adult Health

## 2015-10-21 VITALS — BP 118/70 | Temp 98.3°F | Wt 213.9 lb

## 2015-10-21 DIAGNOSIS — J0140 Acute pansinusitis, unspecified: Secondary | ICD-10-CM | POA: Diagnosis not present

## 2015-10-21 MED ORDER — DOXYCYCLINE HYCLATE 100 MG PO CAPS: 100.0000 mg | ORAL_CAPSULE | Freq: Two times a day (BID) | ORAL | Status: DC

## 2015-10-21 NOTE — Telephone Encounter (Signed)
Ok to refill 

## 2015-10-21 NOTE — Progress Notes (Signed)
Subjective:    Patient ID: Kelsey Cameron, female    DOB: December 23, 1980, 35 y.o.   MRN: [id number]  Sinusitis This is a recurrent problem. The current episode started in the past 7 days. The problem is unchanged. There has been no fever. She is experiencing no pain. Associated symptoms include congestion, headaches, sinus pressure and sneezing. Pertinent negatives include no coughing, ear pain, sore throat or swollen glands. Past treatments include oral decongestants. The treatment provided mild relief.        Review of Systems  HENT: Positive for congestion, postnasal drip, rhinorrhea, sinus pressure and sneezing. Negative for ear pain, sore throat and trouble swallowing.   Respiratory: Negative for cough.   Neurological: Positive for headaches.   Past Medical History  Diagnosis Date  . PONV (postoperative nausea and vomiting)   . Macromastia   . STD (sexually transmitted disease) 2002    CHALMYDIA  . Hypertension     Social History   Social History  . Marital Status: Single    Spouse Name: N/A  . Number of Children: N/A  . Years of Education: N/A   Occupational History  . Not on file.   Social History Main Topics  . Smoking status: Former Smoker    Types: Cigarettes  . Smokeless tobacco: Never Used     Comment: quit smoking 10/2010  . Alcohol Use: No     Comment: 1 drink/month  . Drug Use: No  . Sexual Activity: No     Comment: INTERCOUSRE AGE 41,SEXUAL PARTNERS MORE THAN 5    Other Topics Concern  . Not on file   Social History Narrative    Past Surgical History  Procedure Laterality Date  . Cholecystectomy  2009  . Breast reduction surgery  05/17/2011    Procedure: MAMMARY REDUCTION BILATERAL (BREAST);  Surgeon: Mary A Contogiannis;  Location: [facility];  Service: Plastics;  Laterality: Bilateral;  . Cesarean section  09/25/2006    Family History  Problem Relation Age of Onset  . Aneurysm Mother   . Hypertension Mother   . Breast  cancer Mother 62  . Aneurysm Father   . Hypertension Father   . Heart disease Father   . Breast cancer Maternal Aunt   . Breast cancer Maternal Grandmother     Allergies  Allergen Reactions  . Amoxicillin Anaphylaxis and Hives    Current Outpatient Prescriptions on File Prior to Visit  Medication Sig Dispense Refill  . Multiple Vitamin (MULTIVITAMIN) capsule Take 1 capsule by mouth daily. Reported on 07/09/2015     No current facility-administered medications on file prior to visit.    BP 118/70 mmHg  Temp(Src) 98.3 F (36.8 C) (Oral)  Wt 213 lb 14.4 oz (97.024 kg)       Objective:   Physical Exam  Constitutional: She is oriented to person, place, and time. She appears well-developed and well-nourished. No distress.  HENT:  Head: Normocephalic and atraumatic.  Right Ear: Hearing, tympanic membrane, external ear and ear canal normal.  Left Ear: Hearing, tympanic membrane, external ear and ear canal normal.  Nose: Mucosal edema and rhinorrhea present. Right sinus exhibits maxillary sinus tenderness and frontal sinus tenderness. Left sinus exhibits maxillary sinus tenderness and frontal sinus tenderness.  Mouth/Throat: Uvula is midline and oropharynx is clear and moist. No oropharyngeal exudate, posterior oropharyngeal edema or posterior oropharyngeal erythema.  Eyes: Conjunctivae and EOM are normal. Pupils are equal, round, and reactive to light. Right eye exhibits no discharge. Left  eye exhibits no discharge.  Cardiovascular: Normal rate, regular rhythm, normal heart sounds and intact distal pulses.  Exam reveals no gallop and no friction rub.   No murmur heard. Pulmonary/Chest: Effort normal and breath sounds normal. No respiratory distress. She has no wheezes. She has no rales. She exhibits no tenderness.  Neurological: She is alert and oriented to person, place, and time.  Skin: Skin is warm and dry. No rash noted. She is not diaphoretic. No erythema. No pallor.    Psychiatric: She has a normal mood and affect. Her behavior is normal. Thought content normal.  Vitals reviewed.     Assessment & Plan:  1. Acute pansinusitis, recurrence not specified - doxycycline (VIBRAMYCIN) 100 MG capsule; Take 1 capsule (100 mg total) by mouth 2 (two) times daily.  Dispense: 20 capsule; Refill: 0 - Continue with Flonase - Follow up if no improvement in the next 2-3 days or if symptoms worsen   [name], NP

## 2015-10-21 NOTE — Patient Instructions (Signed)
I am going to to treat you for a sinus infection.... Again.   Take doxycycline twice a day for 10 days. Continue to use the Flonase.    I hope you have a very happy birthday!   Let me know if you need anything.

## 2015-10-21 NOTE — Telephone Encounter (Signed)
Appt scheduled for today at 5:00.

## 2015-10-21 NOTE — Telephone Encounter (Signed)
Please do not fill.   This was prescribed for a sinus infection and if she is still having symptoms, she needs to come in and be evaluated

## 2015-11-17 ENCOUNTER — Encounter: Payer: Self-pay | Admitting: Adult Health

## 2015-11-17 ENCOUNTER — Ambulatory Visit (INDEPENDENT_AMBULATORY_CARE_PROVIDER_SITE_OTHER): Payer: BLUE CROSS/BLUE SHIELD | Admitting: Adult Health

## 2015-11-17 VITALS — BP 150/98 | Temp 97.5°F | Wt 216.3 lb

## 2015-11-17 DIAGNOSIS — I10 Essential (primary) hypertension: Secondary | ICD-10-CM

## 2015-11-17 DIAGNOSIS — Z7189 Other specified counseling: Secondary | ICD-10-CM

## 2015-11-17 DIAGNOSIS — Z7689 Persons encountering health services in other specified circumstances: Secondary | ICD-10-CM

## 2015-11-17 NOTE — Progress Notes (Signed)
Patient presents to clinic today to establish care. She is a pleasant AA 35 year old female who  has a past medical history of PONV (postoperative nausea and vomiting); Macromastia; STD (sexually transmitted disease) (2002); and Hypertension.  She last had her physical at January 2017 with her GYN   Acute Concerns: Establish Care    Chronic Issues: Hypertension  - She has not taken her medications today due to running out five days ago. She reports that she has picked them up at the pharmacy today before coming to the office.   Health Maintenance: Dental --Twice a yearly Vision -- Yearly  Immunizations -- UtD  PAP -- 06/2015 - GYN Diet: She tries to eat healthy  Exercise: She goes to the gym multiple times per week.   Is followed by GYN   Past Medical History  Diagnosis Date  . PONV (postoperative nausea and vomiting)   . Macromastia   . STD (sexually transmitted disease) 2002    CHALMYDIA  . Hypertension     Past Surgical History  Procedure Laterality Date  . Cholecystectomy  2009  . Breast reduction surgery  05/17/2011    Procedure: MAMMARY REDUCTION BILATERAL (BREAST);  Surgeon: Mary A Contogiannis;  Location: [facility];  Service: Plastics;  Laterality: Bilateral;  . Cesarean section  09/25/2006    Current Outpatient Prescriptions on File Prior to Visit  Medication Sig Dispense Refill  . Multiple Vitamin (MULTIVITAMIN) capsule Take 1 capsule by mouth daily. Reported on 07/09/2015     No current facility-administered medications on file prior to visit.    Allergies  Allergen Reactions  . Amoxicillin Anaphylaxis and Hives    Family History  Problem Relation Age of Onset  . Aneurysm Mother   . Hypertension Mother   . Breast cancer Mother 64  . Aneurysm Father   . Hypertension Father   . Heart disease Father   . Breast cancer Maternal Aunt   . Breast cancer Maternal Grandmother     Social History   Social History  . Marital Status:  Single    Spouse Name: N/A  . Number of Children: N/A  . Years of Education: N/A   Occupational History  . Not on file.   Social History Main Topics  . Smoking status: Former Smoker    Types: Cigarettes  . Smokeless tobacco: Never Used     Comment: quit smoking 10/2010  . Alcohol Use: No     Comment: 1 drink/month  . Drug Use: No  . Sexual Activity: No     Comment: INTERCOUSRE AGE 15,SEXUAL PARTNERS MORE THAN 5    Other Topics Concern  . Not on file   Social History Narrative   Works at [employer]   Has one child    Single       She likes to travel, work outside in her yard.        Review of Systems  Constitutional: Negative.   Respiratory: Negative.   Cardiovascular: Negative.   Gastrointestinal: Negative.   Genitourinary: Negative.   Musculoskeletal: Negative.   Neurological: Negative.   All other systems reviewed and are negative.   BP 150/98 mmHg  Temp(Src) 97.5 F (36.4 C) (Oral)  Wt 216 lb 4.8 oz (98.113 kg)  Physical Exam  Constitutional: She is oriented to person, place, and time and well-developed, well-nourished, and in no distress. No distress.  HENT:  Head: Normocephalic and atraumatic.  Right Ear: Hearing, tympanic membrane, external ear and ear  canal normal.  Left Ear: Hearing, tympanic membrane, external ear and ear canal normal.  Nose: Nose normal. No mucosal edema or rhinorrhea.  Mouth/Throat: Oropharynx is clear and moist. No oropharyngeal exudate.  Eyes: Conjunctivae and EOM are normal. Pupils are equal, round, and reactive to light. Right eye exhibits no discharge.  Neck: Normal range of motion. Neck supple. No thyromegaly present.  Cardiovascular: Normal rate, regular rhythm, normal heart sounds and intact distal pulses.  Exam reveals no gallop and no friction rub.   No murmur heard. Pulmonary/Chest: Effort normal and breath sounds normal. No respiratory distress. She has no wheezes. She has no rales. She exhibits no tenderness.    Lymphadenopathy:    She has no cervical adenopathy.  Neurological: She is alert and oriented to person, place, and time. Gait normal. GCS score is 15.  Skin: Skin is warm and dry. No rash noted. She is not diaphoretic. No erythema. No pallor.  Psychiatric: Mood, memory, affect and judgment normal.  Nursing note and vitals reviewed.   No results found for this or any previous visit (from the past 2160 hour(s)).  Assessment/Plan: 1. Essential hypertension - Take blood pressure medication daily - Monitor at home and let me know if BP is above 140 consistently - Work on diet and exercise.   2. Encounter to establish care - Follow up for CPE  - Follow up sooner if needed - Increase exercise and decrease calories.   [name], NP

## 2015-11-17 NOTE — Patient Instructions (Addendum)
As always, it is great seeing you!  Take your blood pressure medication every day   Work on diet and exercise, increase aerobic exercise.   Follow up with me as needed  Health Maintenance, Female Adopting a healthy lifestyle and getting preventive care can go a long way to promote health and wellness. Talk with your health care provider about what schedule of regular examinations is right for you. This is a good chance for you to check in with your provider about disease prevention and staying healthy. In between checkups, there are plenty of things you can do on your own. Experts have done a lot of research about which lifestyle changes and preventive measures are most likely to keep you healthy. Ask your health care provider for more information. WEIGHT AND DIET  Eat a healthy diet  Be sure to include plenty of vegetables, fruits, low-fat dairy products, and lean protein.  Do not eat a lot of foods high in solid fats, added sugars, or salt.  Get regular exercise. This is one of the most important things you can do for your health.  Most adults should exercise for at least 150 minutes each week. The exercise should increase your heart rate and make you sweat (moderate-intensity exercise).  Most adults should also do strengthening exercises at least twice a week. This is in addition to the moderate-intensity exercise.  Maintain a healthy weight  Body mass index (BMI) is a measurement that can be used to identify possible weight problems. It estimates body fat based on height and weight. Your health care provider can help determine your BMI and help you achieve or maintain a healthy weight.  For females 27 years of age and older:   A BMI below 18.5 is considered underweight.  A BMI of 18.5 to 24.9 is normal.  A BMI of 25 to 29.9 is considered overweight.  A BMI of 30 and above is considered obese.  Watch levels of cholesterol and blood lipids  You should start having your  blood tested for lipids and cholesterol at 37 years of age, then have this test every 5 years.  You may need to have your cholesterol levels checked more often if:  Your lipid or cholesterol levels are high.  You are older than 37 years of age.  You are at high risk for heart disease.  CANCER SCREENING   Lung Cancer  Lung cancer screening is recommended for adults 8-37 years old who are at high risk for lung cancer because of a history of smoking.  A yearly low-dose CT scan of the lungs is recommended for people who:  Currently smoke.  Have quit within the past 15 years.  Have at least a 30-pack-year history of smoking. A pack year is smoking an average of one pack of cigarettes a day for 1 year.  Yearly screening should continue until it has been 15 years since you quit.  Yearly screening should stop if you develop a health problem that would prevent you from having lung cancer treatment.  Breast Cancer  Practice breast self-awareness. This means understanding how your breasts normally appear and feel.  It also means doing regular breast self-exams. Let your health care provider know about any changes, no matter how small.  If you are in your 20s or 30s, you should have a clinical breast exam (CBE) by a health care provider every 1-3 years as part of a regular health exam.  If you are 36 or older, have a  CBE every year. Also consider having a breast X-ray (mammogram) every year.  If you have a family history of breast cancer, talk to your health care provider about genetic screening.  If you are at high risk for breast cancer, talk to your health care provider about having an MRI and a mammogram every year.  Breast cancer gene (BRCA) assessment is recommended for women who have family members with BRCA-related cancers. BRCA-related cancers include:  Breast.  Ovarian.  Tubal.  Peritoneal cancers.  Results of the assessment will determine the need for genetic  counseling and BRCA1 and BRCA2 testing. Cervical Cancer Your health care provider may recommend that you be screened regularly for cancer of the pelvic organs (ovaries, uterus, and vagina). This screening involves a pelvic examination, including checking for microscopic changes to the surface of your cervix (Pap test). You may be encouraged to have this screening done every 3 years, beginning at age 18.  For women ages 73-65, health care providers may recommend pelvic exams and Pap testing every 3 years, or they may recommend the Pap and pelvic exam, combined with testing for human papilloma virus (HPV), every 5 years. Some types of HPV increase your risk of cervical cancer. Testing for HPV may also be done on women of any age with unclear Pap test results.  Other health care providers may not recommend any screening for nonpregnant women who are considered low risk for pelvic cancer and who do not have symptoms. Ask your health care provider if a screening pelvic exam is right for you.  If you have had past treatment for cervical cancer or a condition that could lead to cancer, you need Pap tests and screening for cancer for at least 20 years after your treatment. If Pap tests have been discontinued, your risk factors (such as having a new sexual partner) need to be reassessed to determine if screening should resume. Some women have medical problems that increase the chance of getting cervical cancer. In these cases, your health care provider may recommend more frequent screening and Pap tests. Colorectal Cancer  This type of cancer can be detected and often prevented.  Routine colorectal cancer screening usually begins at 37 years of age and continues through 37 years of age.  Your health care provider may recommend screening at an earlier age if you have risk factors for colon cancer.  Your health care provider may also recommend using home test kits to check for hidden blood in the stool.  A  small camera at the end of a tube can be used to examine your colon directly (sigmoidoscopy or colonoscopy). This is done to check for the earliest forms of colorectal cancer.  Routine screening usually begins at age 28.  Direct examination of the colon should be repeated every 5-10 years through 37 years of age. However, you may need to be screened more often if early forms of precancerous polyps or small growths are found. Skin Cancer  Check your skin from head to toe regularly.  Tell your health care provider about any new moles or changes in moles, especially if there is a change in a mole's shape or color.  Also tell your health care provider if you have a mole that is larger than the size of a pencil eraser.  Always use sunscreen. Apply sunscreen liberally and repeatedly throughout the day.  Protect yourself by wearing long sleeves, pants, a wide-brimmed hat, and sunglasses whenever you are outside. HEART DISEASE, DIABETES, AND HIGH BLOOD  PRESSURE   High blood pressure causes heart disease and increases the risk of stroke. High blood pressure is more likely to develop in:  People who have blood pressure in the high end of the normal range (130-139/85-89 mm Hg).  People who are overweight or obese.  People who are African American.  If you are 31-36 years of age, have your blood pressure checked every 3-5 years. If you are 74 years of age or older, have your blood pressure checked every year. You should have your blood pressure measured twice--once when you are at a hospital or clinic, and once when you are not at a hospital or clinic. Record the average of the two measurements. To check your blood pressure when you are not at a hospital or clinic, you can use:  An automated blood pressure machine at a pharmacy.  A home blood pressure monitor.  If you are between 13 years and 54 years old, ask your health care provider if you should take aspirin to prevent strokes.  Have  regular diabetes screenings. This involves taking a blood sample to check your fasting blood sugar level.  If you are at a normal weight and have a low risk for diabetes, have this test once every three years after 37 years of age.  If you are overweight and have a high risk for diabetes, consider being tested at a younger age or more often. PREVENTING INFECTION  Hepatitis B  If you have a higher risk for hepatitis B, you should be screened for this virus. You are considered at high risk for hepatitis B if:  You were born in a country where hepatitis B is common. Ask your health care provider which countries are considered high risk.  Your parents were born in a high-risk country, and you have not been immunized against hepatitis B (hepatitis B vaccine).  You have HIV or AIDS.  You use needles to inject street drugs.  You live with someone who has hepatitis B.  You have had sex with someone who has hepatitis B.  You get hemodialysis treatment.  You take certain medicines for conditions, including cancer, organ transplantation, and autoimmune conditions. Hepatitis C  Blood testing is recommended for:  Everyone born from [birth year] through 1967.  Anyone with known risk factors for hepatitis C. Sexually transmitted infections (STIs)  You should be screened for sexually transmitted infections (STIs) including gonorrhea and chlamydia if:  You are sexually active and are younger than 37 years of age.  You are older than 37 years of age and your health care provider tells you that you are at risk for this type of infection.  Your sexual activity has changed since you were last screened and you are at an increased risk for chlamydia or gonorrhea. Ask your health care provider if you are at risk.  If you do not have HIV, but are at risk, it may be recommended that you take a prescription medicine daily to prevent HIV infection. This is called pre-exposure prophylaxis (PrEP). You are  considered at risk if:  You are sexually active and do not regularly use condoms or know the HIV status of your partner(s).  You take drugs by injection.  You are sexually active with a partner who has HIV. Talk with your health care provider about whether you are at high risk of being infected with HIV. If you choose to begin PrEP, you should first be tested for HIV. You should then be tested every 3  months for as long as you are taking PrEP.  PREGNANCY   If you are premenopausal and you may become pregnant, ask your health care provider about preconception counseling.  If you may become pregnant, take 400 to 800 micrograms (mcg) of folic acid every day.  If you want to prevent pregnancy, talk to your health care provider about birth control (contraception). OSTEOPOROSIS AND MENOPAUSE   Osteoporosis is a disease in which the bones lose minerals and strength with aging. This can result in serious bone fractures. Your risk for osteoporosis can be identified using a bone density scan.  If you are 22 years of age or older, or if you are at risk for osteoporosis and fractures, ask your health care provider if you should be screened.  Ask your health care provider whether you should take a calcium or vitamin D supplement to lower your risk for osteoporosis.  Menopause may have certain physical symptoms and risks.  Hormone replacement therapy may reduce some of these symptoms and risks. Talk to your health care provider about whether hormone replacement therapy is right for you.  HOME CARE INSTRUCTIONS   Schedule regular health, dental, and eye exams.  Stay current with your immunizations.   Do not use any tobacco products including cigarettes, chewing tobacco, or electronic cigarettes.  If you are pregnant, do not drink alcohol.  If you are breastfeeding, limit how much and how often you drink alcohol.  Limit alcohol intake to no more than 1 drink per day for nonpregnant women. One  drink equals 12 ounces of beer, 5 ounces of wine, or 1 ounces of hard liquor.  Do not use street drugs.  Do not share needles.  Ask your health care provider for help if you need support or information about quitting drugs.  Tell your health care provider if you often feel depressed.  Tell your health care provider if you have ever been abused or do not feel safe at home.   This information is not intended to replace advice given to you by your health care provider. Make sure you discuss any questions you have with your health care provider.   Document Released: 12/20/2010 Document Revised: 06/27/2014 Document Reviewed: 05/08/2013 Elsevier Interactive Patient Education [employer].

## 2016-02-04 ENCOUNTER — Encounter: Payer: Self-pay | Admitting: Adult Health

## 2016-02-04 ENCOUNTER — Ambulatory Visit (INDEPENDENT_AMBULATORY_CARE_PROVIDER_SITE_OTHER): Payer: BLUE CROSS/BLUE SHIELD | Admitting: Adult Health

## 2016-02-04 VITALS — BP 132/88 | Temp 98.2°F | Ht 64.0 in | Wt 212.0 lb

## 2016-02-04 DIAGNOSIS — J324 Chronic pansinusitis: Secondary | ICD-10-CM | POA: Diagnosis not present

## 2016-02-04 NOTE — Progress Notes (Signed)
Subjective:    Patient ID: Kelsey Cameron, female    DOB: [birth year], 35 y.o.   MRN: [id number]  Sinusitis  This is a recurrent problem. The current episode started yesterday. The problem is unchanged. There has been no fever. Associated symptoms include diaphoresis, ear pain, headaches and sinus pressure. Pertinent negatives include no chills, congestion, shortness of breath, sore throat or swollen glands.     Review of Systems  Constitutional: Positive for diaphoresis. Negative for chills.  HENT: Positive for ear pain, rhinorrhea and sinus pressure. Negative for congestion, nosebleeds, postnasal drip and sore throat.   Eyes: Negative.   Respiratory: Negative.  Negative for shortness of breath.   Cardiovascular: Negative.   Neurological: Positive for headaches.   Past Medical History:  Diagnosis Date  . Hypertension   . Macromastia   . PONV (postoperative nausea and vomiting)   . STD (sexually transmitted disease) 2002   [place] History  . Marital status: Single    Spouse name: N/A  . Number of children: N/A  . Years of education: N/A   Occupational History  . Not on file.   Social History Main Topics  . Smoking status: Former Smoker    Types: Cigarettes  . Smokeless tobacco: Never Used     Comment: quit smoking 10/2010  . Alcohol use No     Comment: 1 drink/month  . Drug use: No  . Sexual activity: No     Comment: INTERCOUSRE AGE 27,SEXUAL PARTNERS MORE THAN 5    Other Topics Concern  . Not on file   Social History Narrative   Works at [employer]   Has one child    Single       She likes to travel, work outside in her yard.        Past Surgical History:  Procedure Laterality Date  . BREAST REDUCTION SURGERY  05/17/2011   Procedure: MAMMARY REDUCTION BILATERAL (BREAST);  Surgeon: Mary A Contogiannis;  Location: [facility];  Service: Plastics;  Laterality: Bilateral;  . CESAREAN SECTION  09/25/2006  .  CHOLECYSTECTOMY  2009    Family History  Problem Relation Age of Onset  . Aneurysm Mother   . Hypertension Mother   . Breast cancer Mother 57  . Aneurysm Father   . Hypertension Father   . Heart disease Father   . Breast cancer Maternal Aunt   . Breast cancer Maternal Grandmother     Allergies  Allergen Reactions  . Amoxicillin Anaphylaxis and Hives    Current Outpatient Prescriptions on File Prior to Visit  Medication Sig Dispense Refill  . amLODipine (NORVASC) 10 MG tablet Take 10 mg by mouth daily.    Marland Kitchen atenolol (TENORMIN) 25 MG tablet Take 25 mg by mouth daily.    . benazepril-hydrochlorthiazide (LOTENSIN HCT) 20-25 MG tablet Take 1 tablet by mouth daily.    . Multiple Vitamin (MULTIVITAMIN) capsule Take 1 capsule by mouth daily. Reported on 07/09/2015     No current facility-administered medications on file prior to visit.     BP 132/88   Temp 98.2 F (36.8 C) (Oral)   Ht 5\' 4"  (1.626 m)   Wt 212 lb (96.2 kg)   BMI 36.39 kg/m       Objective:   Physical Exam  Constitutional: She is oriented to person, place, and time. She appears well-developed and well-nourished. No distress.  HENT:  Head: Normocephalic and atraumatic.  Right Ear: External  ear normal.  Left Ear: External ear normal.  Nose: Nose normal.  Mouth/Throat: Oropharynx is clear and moist. No oropharyngeal exudate.  Eyes: Conjunctivae and EOM are normal. Pupils are equal, round, and reactive to light. Right eye exhibits no discharge. Left eye exhibits no discharge. No scleral icterus.  Neck: Neck supple.  Cardiovascular: Normal rate, regular rhythm, normal heart sounds and intact distal pulses.  Exam reveals no gallop and no friction rub.   No murmur heard. Pulmonary/Chest: Effort normal and breath sounds normal. No respiratory distress. She has no wheezes. She has no rales. She exhibits no tenderness.  Neurological: She is alert and oriented to person, place, and time. She has normal reflexes. She  displays normal reflexes. No cranial nerve deficit. She exhibits normal muscle tone. Coordination normal.  Skin: Skin is warm and dry. No rash noted. She is not diaphoretic. No erythema. No pallor.  Psychiatric: She has a normal mood and affect. Her behavior is normal. Judgment and thought content normal.  Nursing note and vitals reviewed.     Assessment & Plan:  1. Chronic pansinusitis - This is her third sinus infection in the last 6 months.  - I am not going to treat with abx today as it has only been a little over 24 hours since her symptoms started - CT Maxillofacial WO CM; Future - Nasonex, Xyzal and normal saline spray  - Consider referral to ENT - Follow up if no improvement  [name], NP

## 2016-02-04 NOTE — Patient Instructions (Addendum)
It was great seeing you today!  I am sorry you are feeling badly.   I have ordered a CT of your sinus cavities  In the meantime, use Nasonex, Xyzal and normal saline spray  Drink plenty of fluids.     General Recommendations:    Please drink plenty of fluids.  Get plenty of rest   Sleep in humidified air  Use saline nasal sprays  Netti pot   OTC Medications:  Decongestants - helps relieve congestion   Flonase (generic fluticasone) or Nasacort (generic triamcinolone) - please make sure to use the "cross-over" technique at a 45 degree angle towards the opposite eye as opposed to straight up the nasal passageway.   Sudafed (generic pseudoephedrine - Note this is the one that is available behind the pharmacy counter); Products with phenylephrine (-PE) may also be used but is often not as effective as pseudoephedrine.   If you have HIGH BLOOD PRESSURE - Coricidin HBP; AVOID any product that is -D as this contains pseudoephedrine which may increase your blood pressure.  Afrin (oxymetazoline) every 6-8 hours for up to 3 days.   Allergies - helps relieve runny nose, itchy eyes and sneezing   Claritin (generic loratidine), Allegra (fexofenidine), or Zyrtec (generic cyrterizine) for runny nose. These medications should not cause drowsiness.  Note - Benadryl (generic diphenhydramine) may be used however may cause drowsiness  Cough -   Delsym or Robitussin (generic dextromethorphan)  Expectorants - helps loosen mucus to ease removal   Mucinex (generic guaifenesin) as directed on the package.  Headaches / General Aches   Tylenol (generic acetaminophen) - DO NOT EXCEED 3 grams (3,000 mg) in a 24 hour time period  Advil/Motrin (generic ibuprofen)   Sore Throat -   Salt water gargle   Chloraseptic (generic benzocaine) spray or lozenges / Sucrets (generic dyclonine)    Sinusitis Sinusitis is redness, soreness, and inflammation of the paranasal sinuses. Paranasal  sinuses are air pockets within the bones of your face (beneath the eyes, the middle of the forehead, or above the eyes). In healthy paranasal sinuses, mucus is able to drain out, and air is able to circulate through them by way of your nose. However, when your paranasal sinuses are inflamed, mucus and air can become trapped. This can allow bacteria and other germs to grow and cause infection. Sinusitis can develop quickly and last only a short time (acute) or continue over a long period (chronic). Sinusitis that lasts for more than 12 weeks is considered chronic.  CAUSES  Causes of sinusitis include:  Allergies.  Structural abnormalities, such as displacement of the cartilage that separates your nostrils (deviated septum), which can decrease the air flow through your nose and sinuses and affect sinus drainage.  Functional abnormalities, such as when the small hairs (cilia) that line your sinuses and help remove mucus do not work properly or are not present. SIGNS AND SYMPTOMS  Symptoms of acute and chronic sinusitis are the same. The primary symptoms are pain and pressure around the affected sinuses. Other symptoms include:  Upper toothache.  Earache.  Headache.  Bad breath.  Decreased sense of smell and taste.  A cough, which worsens when you are lying flat.  Fatigue.  Fever.  Thick drainage from your nose, which often is green and may contain pus (purulent).  Swelling and warmth over the affected sinuses. DIAGNOSIS  Your health care provider will perform a physical exam. During the exam, your health care provider may:  Look in your nose  for signs of abnormal growths in your nostrils (nasal polyps).  Tap over the affected sinus to check for signs of infection.  View the inside of your sinuses (endoscopy) using an imaging device that has a light attached (endoscope). If your health care provider suspects that you have chronic sinusitis, one or more of the following tests may  be recommended:  Allergy tests.  Nasal culture. A sample of mucus is taken from your nose, sent to a lab, and screened for bacteria.  Nasal cytology. A sample of mucus is taken from your nose and examined by your health care provider to determine if your sinusitis is related to an allergy. TREATMENT  Most cases of acute sinusitis are related to a viral infection and will resolve on their own within 10 days. Sometimes medicines are prescribed to help relieve symptoms (pain medicine, decongestants, nasal steroid sprays, or saline sprays).  However, for sinusitis related to a bacterial infection, your health care provider will prescribe antibiotic medicines. These are medicines that will help kill the bacteria causing the infection.  Rarely, sinusitis is caused by a fungal infection. In theses cases, your health care provider will prescribe antifungal medicine. For some cases of chronic sinusitis, surgery is needed. Generally, these are cases in which sinusitis recurs more than 3 times per year, despite other treatments. HOME CARE INSTRUCTIONS   Drink plenty of water. Water helps thin the mucus so your sinuses can drain more easily.  Use a humidifier.  Inhale steam 3 to 4 times a day (for example, sit in the bathroom with the shower running).  Apply a warm, moist washcloth to your face 3 to 4 times a day, or as directed by your health care provider.  Use saline nasal sprays to help moisten and clean your sinuses.  Take medicines only as directed by your health care provider.  If you were prescribed either an antibiotic or antifungal medicine, finish it all even if you start to feel better. SEEK IMMEDIATE MEDICAL CARE IF:  You have increasing pain or severe headaches.  You have nausea, vomiting, or drowsiness.  You have swelling around your face.  You have vision problems.  You have a stiff neck.  You have difficulty breathing. MAKE SURE YOU:   Understand these  instructions.  Will watch your condition.  Will get help right away if you are not doing well or get worse. Document Released: 06/06/2005 Document Revised: 10/21/2013 Document Reviewed: 06/21/2011 [facility] Patient Information 2015 [place], [place]. This information is not intended to replace advice given to you by your health care provider. Make sure you discuss any questions you have with your health care provider.

## 2016-02-12 ENCOUNTER — Ambulatory Visit (INDEPENDENT_AMBULATORY_CARE_PROVIDER_SITE_OTHER)
Admission: RE | Admit: 2016-02-12 | Discharge: 2016-02-12 | Disposition: A | Discharge status: Home / Self Care | Payer: BLUE CROSS/BLUE SHIELD | Source: Ambulatory Visit | Attending: Adult Health | Admitting: Adult Health

## 2016-02-12 DIAGNOSIS — J324 Chronic pansinusitis: Secondary | ICD-10-CM

## 2016-02-16 ENCOUNTER — Telehealth: Payer: Self-pay | Admitting: Adult Health

## 2016-02-16 NOTE — Telephone Encounter (Signed)
Spoke to patient and updated her on her CT results. Advised to use Flonase and Claritin to help with her symptoms

## 2016-03-03 ENCOUNTER — Telehealth: Payer: Self-pay | Admitting: Adult Health

## 2016-03-03 ENCOUNTER — Encounter: Payer: Self-pay | Admitting: Adult Health

## 2016-03-03 ENCOUNTER — Ambulatory Visit (INDEPENDENT_AMBULATORY_CARE_PROVIDER_SITE_OTHER): Payer: BLUE CROSS/BLUE SHIELD | Admitting: Adult Health

## 2016-03-03 VITALS — BP 128/82 | Temp 98.2°F | Ht 64.0 in | Wt 210.0 lb

## 2016-03-03 DIAGNOSIS — J321 Chronic frontal sinusitis: Secondary | ICD-10-CM

## 2016-03-03 MED ORDER — DOXYCYCLINE HYCLATE 100 MG PO CAPS
100.0000 mg | ORAL_CAPSULE | Freq: Two times a day (BID) | ORAL | 0 refills | Status: DC
Start: 2016-03-03 — End: 2017-01-04

## 2016-03-03 NOTE — Progress Notes (Signed)
   Subjective:    Patient ID: Kelsey Cameron, female    DOB: December 16, 1980, 35 y.o.   MRN: [id number]  HPI  35 year old female who presents to the office today for continued sinus infection type symptoms. I last saw her on 02/04/2016 for a sinus infection. I did not treat with abx therapy due to her symptoms only being for 24 hours. I ordered a CT of sinus cavity which showed   IMPRESSION: 1. The majority of paranasal sinuses are clear. There is mild to moderate mucosal thickening in the left sphenoid sinus. 2. Symmetric appearing nasal cavity mucosal thickening suggestive of rhinitis in this setting.  I advised Flonase, Xyzal and sudafed, which she endorses taking. Since her last visit her symptoms have continued. Today in the office she reports that she continues to have frontal sinus pressure, rhinorrhea, PND, and nausea. She is fatigued and her appetite has changed.   Review of Systems  Constitutional: Negative.   HENT: Positive for congestion, postnasal drip, rhinorrhea and sinus pressure. Negative for ear discharge, ear pain, facial swelling, nosebleeds and sore throat.   Respiratory: Negative.   Gastrointestinal: Positive for nausea. Negative for blood in stool, constipation, diarrhea and vomiting.  Neurological: Positive for headaches. Negative for dizziness and light-headedness.  All other systems reviewed and are negative.      Objective:   Physical Exam  Constitutional: She is oriented to person, place, and time. She appears well-developed and well-nourished. No distress.  HENT:  Head: Normocephalic and atraumatic.  Right Ear: Hearing, tympanic membrane, external ear and ear canal normal.  Left Ear: Hearing, tympanic membrane, external ear and ear canal normal.  Nose: Mucosal edema and rhinorrhea present. Right sinus exhibits frontal sinus tenderness. Right sinus exhibits no maxillary sinus tenderness. Left sinus exhibits frontal sinus tenderness. Left sinus exhibits no  maxillary sinus tenderness.  Mouth/Throat: Uvula is midline and mucous membranes are normal. Oropharyngeal exudate present. No posterior oropharyngeal edema, posterior oropharyngeal erythema or tonsillar abscesses.  Eyes: Conjunctivae are normal. Pupils are equal, round, and reactive to light. Right eye exhibits no discharge. Left eye exhibits no discharge. No scleral icterus.  Neck: Normal range of motion. Neck supple.  Cardiovascular: Normal rate, regular rhythm, normal heart sounds and intact distal pulses.  Exam reveals no gallop and no friction rub.   No murmur heard. Pulmonary/Chest: Effort normal and breath sounds normal. No respiratory distress. She has no wheezes. She has no rales. She exhibits no tenderness.  Lymphadenopathy:    She has no cervical adenopathy.  Neurological: She is alert and oriented to person, place, and time.  Skin: Skin is warm and dry. No rash noted. She is not diaphoretic. No erythema. No pallor.  Psychiatric: She has a normal mood and affect. Her behavior is normal. Judgment and thought content normal.  Nursing note and vitals reviewed.     Assessment & Plan:  1. Chronic frontal sinusitis - doxycycline (VIBRAMYCIN) 100 MG capsule; Take 1 capsule (100 mg total) by mouth 2 (two) times daily.  Dispense: 20 capsule; Refill: 0 - Ambulatory referral to ENT - Follow up as needed - Stop sudafed  [name], NP

## 2016-03-03 NOTE — Telephone Encounter (Signed)
.  FYI, Pt coming in today at 6:30 PM

## 2016-03-03 NOTE — Telephone Encounter (Signed)
[facility] - Client [facility] Patient Name: Kelsey Cameron DOB: March 06, 1981 Initial Comment Caller states she still has a sinus infection. She has been using Afrin and Sudifed. She has been having migraines and constant pressure. Nurse Assessment Nurse: Martyn Ehrich, RN, Felicia Date/Time (Eastern Time): 03/03/2016 3:50:07 PM Confirm and document reason for call. If symptomatic, describe symptoms. You must click the next button to save text entered. ---PT saw MD (longer than 2 weeks ago) and last visit no antx for sinus infection and he scheduled CT bc she gets constant sinus infections and CT was negative - he dx with seasonal allergies (she is not worse than when seen). She uses pseudephed and afrin. She is on BP medication. She is wondering bc sinus infection is lingering and she has a HA. She has not seen him in last 2 weeks. Sinus congestion with HA has been severe today - now is 6 1/2 onset for about a month this time. No fever. MD didnt call HA migraine - he said it was allergic sinus congestione and he suggested the afrin and pseudophed.Has the patient traveled out of the country within the last 30 days? ---No Does the patient have any new or worsening symptoms? ---Yes Will a triage be completed? ---Yes Related visit to physician within the last 2 weeks? ---No Does the PT have any chronic conditions? (i.e. diabetes, asthma, etc.) ---Yes List chronic conditions. ---sinus issues Is the patient pregnant or possibly pregnant? (Ask all females between the ages of 57-55) ---No Is this a behavioral health or substance abuse call? ---No Guidelines Guideline Title Affirmed Question Affirmed Notes Sinus Pain or Congestion [1] Sinus congestion (pressure, fullness) AND [2] present > 10 days Headache [1] Sinus pain of forehead AND [2] yellowor green nasal discharge Final Disposition User See Physician within 24 Hours  [place], RN, Felicia Comments HA is definitely related to sinuses - she can touch chin to chest. HA comes and goes Referrals REFERRED TO PCP OFFICE Disagree/Comply: Comply Call Id: [id number]

## 2016-07-12 ENCOUNTER — Encounter: Payer: BLUE CROSS/BLUE SHIELD | Admitting: Women's Health

## 2016-11-02 ENCOUNTER — Encounter: Payer: Self-pay | Admitting: Gynecology

## 2016-11-11 ENCOUNTER — Telehealth: Payer: Self-pay

## 2016-11-11 NOTE — Telephone Encounter (Signed)
Patient called in voice mail asking if she had "preventative" prescriptions on file for Metrogel and Diflucan.  I explained she was due for annual this past Jan. Those are not Rx's that we usually consider preventative and our protocol is to recommend an office visit if she has or develops symptoms so provider can examine her and make diagnosis and prescribe treatment. She has no symptoms now.  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 I recommended office visit if she does end up needing these Rx's prior to July CE.

## 2017-01-02 ENCOUNTER — Encounter: Payer: BLUE CROSS/BLUE SHIELD | Admitting: Women's Health

## 2017-01-04 ENCOUNTER — Encounter: Payer: Self-pay | Admitting: Family Medicine

## 2017-01-04 ENCOUNTER — Ambulatory Visit (INDEPENDENT_AMBULATORY_CARE_PROVIDER_SITE_OTHER): Payer: 59 | Admitting: Adult Health

## 2017-01-04 ENCOUNTER — Encounter: Payer: Self-pay | Admitting: Adult Health

## 2017-01-04 VITALS — BP 146/102 | Temp 98.9°F | Wt 213.0 lb

## 2017-01-04 DIAGNOSIS — J069 Acute upper respiratory infection, unspecified: Secondary | ICD-10-CM | POA: Diagnosis not present

## 2017-01-04 MED ORDER — HYDROCODONE-HOMATROPINE 5-1.5 MG/5ML PO SYRP: 5.0000 mL | ORAL_SOLUTION | Freq: Four times a day (QID) | ORAL | 0 refills | Status: DC | PRN

## 2017-01-04 MED ORDER — BENZONATATE 200 MG PO CAPS: 200.0000 mg | ORAL_CAPSULE | Freq: Two times a day (BID) | ORAL | 0 refills | Status: DC | PRN

## 2017-01-04 MED ORDER — DOXYCYCLINE HYCLATE 100 MG PO CAPS
100.0000 mg | ORAL_CAPSULE | Freq: Two times a day (BID) | ORAL | 0 refills | Status: DC
Start: 2017-01-04 — End: 2017-03-14

## 2017-01-04 NOTE — Progress Notes (Signed)
   Subjective:    Patient ID: Kelsey Cameron, female    DOB: May 31, 1981, 36 y.o.   MRN: [id number]  URI   This is a new problem. The current episode started 1 to 4 weeks ago (2 weeks). The problem has been gradually worsening. The maximum temperature recorded prior to her arrival was 100.4 - 100.9 F. The fever has been present for 1 to 2 days. Associated symptoms include congestion, coughing, headaches, nausea, rhinorrhea, sinus pain and a sore throat. Pertinent negatives include no abdominal pain, ear pain, swollen glands or wheezing. She has tried decongestant and increased fluids for the symptoms. The treatment provided no relief.      Review of Systems  Constitutional: Positive for activity change, appetite change, fatigue and fever. Negative for chills and diaphoresis.  HENT: Positive for congestion, rhinorrhea, sinus pain, sinus pressure and sore throat. Negative for ear pain and trouble swallowing.   Respiratory: Positive for cough. Negative for shortness of breath and wheezing.   Gastrointestinal: Positive for nausea. Negative for abdominal pain.  Musculoskeletal: Negative.   Skin: Negative.   Neurological: Positive for headaches.  Psychiatric/Behavioral: Negative.        Objective:   Physical Exam  Constitutional: She is oriented to person, place, and time. She appears well-developed and well-nourished. She appears ill. No distress.  HENT:  Right Ear: Hearing, tympanic membrane, external ear and ear canal normal.  Left Ear: Hearing, tympanic membrane, external ear and ear canal normal.  Nose: Mucosal edema and rhinorrhea present. Right sinus exhibits maxillary sinus tenderness and frontal sinus tenderness. Left sinus exhibits maxillary sinus tenderness and frontal sinus tenderness.  Mouth/Throat: Uvula is midline, oropharynx is clear and moist and mucous membranes are normal.  Pulmonary/Chest: Effort normal and breath sounds normal. No respiratory distress. She has no  wheezes. She has no rales. She exhibits no tenderness.  Neurological: She is alert and oriented to person, place, and time.  Skin: Skin is warm and dry. No rash noted. She is not diaphoretic. No erythema. No pallor.  Psychiatric: She has a normal mood and affect. Her behavior is normal. Judgment and thought content normal.  Nursing note and vitals reviewed.     Assessment & Plan:  1. Upper respiratory tract infection, unspecified type - doxycycline (VIBRAMYCIN) 100 MG capsule; Take 1 capsule (100 mg total) by mouth 2 (two) times daily.  Dispense: 20 capsule; Refill: 0 - benzonatate (TESSALON) 200 MG capsule; Take 1 capsule (200 mg total) by mouth 2 (two) times daily as needed for cough.  Dispense: 20 capsule; Refill: 0 - HYDROcodone-homatropine (HYCODAN) 5-1.5 MG/5ML syrup; Take 5 mLs by mouth every 6 (six) hours as needed for cough.  Dispense: 120 mL; Refill: 0 - Follow up if no improvement in the next 2-3 days   [name], NP

## 2017-01-18 ENCOUNTER — Encounter: Payer: BLUE CROSS/BLUE SHIELD | Admitting: Women's Health

## 2017-03-01 ENCOUNTER — Encounter: Payer: 59 | Admitting: Women's Health

## 2017-03-14 ENCOUNTER — Encounter: Payer: Self-pay | Admitting: Women's Health

## 2017-03-14 ENCOUNTER — Ambulatory Visit (INDEPENDENT_AMBULATORY_CARE_PROVIDER_SITE_OTHER): Payer: 59 | Admitting: Women's Health

## 2017-03-14 VITALS — BP 126/80 | Ht 64.0 in | Wt 221.0 lb

## 2017-03-14 DIAGNOSIS — N760 Acute vaginitis: Secondary | ICD-10-CM | POA: Diagnosis not present

## 2017-03-14 DIAGNOSIS — B3731 Acute candidiasis of vulva and vagina: Secondary | ICD-10-CM

## 2017-03-14 DIAGNOSIS — Z01419 Encounter for gynecological examination (general) (routine) without abnormal findings: Secondary | ICD-10-CM

## 2017-03-14 DIAGNOSIS — B9689 Other specified bacterial agents as the cause of diseases classified elsewhere: Secondary | ICD-10-CM | POA: Diagnosis not present

## 2017-03-14 DIAGNOSIS — B373 Candidiasis of vulva and vagina: Secondary | ICD-10-CM | POA: Diagnosis not present

## 2017-03-14 DIAGNOSIS — Z113 Encounter for screening for infections with a predominantly sexual mode of transmission: Secondary | ICD-10-CM | POA: Diagnosis not present

## 2017-03-14 LAB — WET PREP FOR TRICH, YEAST, CLUE

## 2017-03-14 MED ORDER — METRONIDAZOLE 500 MG PO TABS: 500.0000 mg | ORAL_TABLET | Freq: Two times a day (BID) | ORAL | 0 refills | Status: DC

## 2017-03-14 MED ORDER — FLUCONAZOLE 150 MG PO TABS: 150.0000 mg | ORAL_TABLET | Freq: Once | ORAL | 2 refills | Status: AC

## 2017-03-14 NOTE — Patient Instructions (Signed)
Carbohydrate Counting for Diabetes Mellitus, Adult Carbohydrate counting is a method for keeping track of how many carbohydrates you eat. Eating carbohydrates naturally increases the amount of sugar (glucose) in the blood. Counting how many carbohydrates you eat helps keep your blood glucose within normal limits, which helps you manage your diabetes (diabetes mellitus). It is important to know how many carbohydrates you can safely have in each meal. This is different for every person. A diet and nutrition specialist (registered dietitian) can help you make a meal plan and calculate how many carbohydrates you should have at each meal and snack. Carbohydrates are found in the following foods:  Grains, such as breads and cereals.  Dried beans and soy products.  Starchy vegetables, such as potatoes, peas, and corn.  Fruit and fruit juices.  Milk and yogurt.  Sweets and snack foods, such as cake, cookies, candy, chips, and soft drinks.  How do I count carbohydrates? There are two ways to count carbohydrates in food. You can use either of the methods or a combination of both. Reading "Nutrition Facts" on packaged food The "Nutrition Facts" list is included on the labels of almost all packaged foods and beverages in the [place] It includes:  The serving size.  Information about nutrients in each serving, including the grams (g) of carbohydrate per serving.  To use the "Nutrition Facts":  Decide how many servings you will have.  Multiply the number of servings by the number of carbohydrates per serving.  The resulting number is the total amount of carbohydrates that you will be having.  Learning standard serving sizes of other foods When you eat foods containing carbohydrates that are not packaged or do not include "Nutrition Facts" on the label, you need to measure the servings in order to count the amount of carbohydrates:  Measure the foods that you will eat with a food scale or  measuring cup, if needed.  Decide how many standard-size servings you will eat.  Multiply the number of servings by 15. Most carbohydrate-rich foods have about 15 g of carbohydrates per serving. ? For example, if you eat 8 oz (170 g) of strawberries, you will have eaten 2 servings and 30 g of carbohydrates (2 servings x 15 g = 30 g).  For foods that have more than one food mixed, such as soups and casseroles, you must count the carbohydrates in each food that is included.  The following list contains standard serving sizes of common carbohydrate-rich foods. Each of these servings has about 15 g of carbohydrates:   hamburger bun or  English muffin.   oz (15 mL) syrup.   oz (14 g) jelly.  1 slice of bread.  1 six-inch tortilla.  3 oz (85 g) cooked rice or pasta.  4 oz (113 g) cooked dried beans.  4 oz (113 g) starchy vegetable, such as peas, corn, or potatoes.  4 oz (113 g) hot cereal.  4 oz (113 g) mashed potatoes or  of a large baked potato.  4 oz (113 g) canned or frozen fruit.  4 oz (120 mL) fruit juice.  4-6 crackers.  6 chicken nuggets.  6 oz (170 g) unsweetened dry cereal.  6 oz (170 g) plain fat-free yogurt or yogurt sweetened with artificial sweeteners.  8 oz (240 mL) milk.  8 oz (170 g) fresh fruit or one small piece of fruit.  24 oz (680 g) popped popcorn.  Example of carbohydrate counting Sample meal  3 oz (85 g) chicken breast.    6 oz (170 g) brown rice.  4 oz (113 g) corn.  8 oz (240 mL) milk.  8 oz (170 g) strawberries with sugar-free whipped topping. Carbohydrate calculation 1. Identify the foods that contain carbohydrates: ? Rice. ? Corn. ? Milk. ? Strawberries. 2. Calculate how many servings you have of each food: ? 2 servings rice. ? 1 serving corn. ? 1 serving milk. ? 1 serving strawberries. 3. Multiply each number of servings by 15 g: ? 2 servings rice x 15 g = 30 g. ? 1 serving corn x 15 g = 15 g. ? 1 serving milk x 15  g = 15 g. ? 1 serving strawberries x 15 g = 15 g. 4. Add together all of the amounts to find the total grams of carbohydrates eaten: ? 30 g + 15 g + 15 g + 15 g = 75 g of carbohydrates total. This information is not intended to replace advice given to you by your health care provider. Make sure you discuss any questions you have with your health care provider. Document Released: 06/06/2005 Document Revised: 12/25/2015 Document Reviewed: 11/18/2015 Elsevier Interactive Patient Education  2018 [place] Maintenance, Female Adopting a healthy lifestyle and getting preventive care can go a long way to promote health and wellness. Talk with your health care provider about what schedule of regular examinations is right for you. This is a good chance for you to check in with your provider about disease prevention and staying healthy. In between checkups, there are plenty of things you can do on your own. Experts have done a lot of research about which lifestyle changes and preventive measures are most likely to keep you healthy. Ask your health care provider for more information. Weight and diet Eat a healthy diet  Be sure to include plenty of vegetables, fruits, low-fat dairy products, and lean protein.  Do not eat a lot of foods high in solid fats, added sugars, or salt.  Get regular exercise. This is one of the most important things you can do for your health. ? Most adults should exercise for at least 150 minutes each week. The exercise should increase your heart rate and make you sweat (moderate-intensity exercise). ? Most adults should also do strengthening exercises at least twice a week. This is in addition to the moderate-intensity exercise.  Maintain a healthy weight  Body mass index (BMI) is a measurement that can be used to identify possible weight problems. It estimates body fat based on height and weight. Your health care provider can help determine your BMI and help you  achieve or maintain a healthy weight.  For females 76 years of age and older: ? A BMI below 18.5 is considered underweight. ? A BMI of 18.5 to 24.9 is normal. ? A BMI of 25 to 29.9 is considered overweight. ? A BMI of 30 and above is considered obese.  Watch levels of cholesterol and blood lipids  You should start having your blood tested for lipids and cholesterol at 38 years of age, then have this test every 5 years.  You may need to have your cholesterol levels checked more often if: ? Your lipid or cholesterol levels are high. ? You are older than 38 years of age. ? You are at high risk for heart disease.  Cancer screening Lung Cancer  Lung cancer screening is recommended for adults 52-25 years old who are at high risk for lung cancer because of a history of smoking.  A  yearly low-dose CT scan of the lungs is recommended for people who: ? Currently smoke. ? Have quit within the past 15 years. ? Have at least a 30-pack-year history of smoking. A pack year is smoking an average of one pack of cigarettes a day for 1 year.  Yearly screening should continue until it has been 15 years since you quit.  Yearly screening should stop if you develop a health problem that would prevent you from having lung cancer treatment.  Breast Cancer  Practice breast self-awareness. This means understanding how your breasts normally appear and feel.  It also means doing regular breast self-exams. Let your health care provider know about any changes, no matter how small.  If you are in your 20s or 30s, you should have a clinical breast exam (CBE) by a health care provider every 1-3 years as part of a regular health exam.  If you are 4 or older, have a CBE every year. Also consider having a breast X-ray (mammogram) every year.  If you have a family history of breast cancer, talk to your health care provider about genetic screening.  If you are at high risk for breast cancer, talk to your health  care provider about having an MRI and a mammogram every year.  Breast cancer gene (BRCA) assessment is recommended for women who have family members with BRCA-related cancers. BRCA-related cancers include: ? Breast. ? Ovarian. ? Tubal. ? Peritoneal cancers.  Results of the assessment will determine the need for genetic counseling and BRCA1 and BRCA2 testing.  Cervical Cancer Your health care provider may recommend that you be screened regularly for cancer of the pelvic organs (ovaries, uterus, and vagina). This screening involves a pelvic examination, including checking for microscopic changes to the surface of your cervix (Pap test). You may be encouraged to have this screening done every 3 years, beginning at age 12.  For women ages 13-65, health care providers may recommend pelvic exams and Pap testing every 3 years, or they may recommend the Pap and pelvic exam, combined with testing for human papilloma virus (HPV), every 5 years. Some types of HPV increase your risk of cervical cancer. Testing for HPV may also be done on women of any age with unclear Pap test results.  Other health care providers may not recommend any screening for nonpregnant women who are considered low risk for pelvic cancer and who do not have symptoms. Ask your health care provider if a screening pelvic exam is right for you.  If you have had past treatment for cervical cancer or a condition that could lead to cancer, you need Pap tests and screening for cancer for at least 20 years after your treatment. If Pap tests have been discontinued, your risk factors (such as having a new sexual partner) need to be reassessed to determine if screening should resume. Some women have medical problems that increase the chance of getting cervical cancer. In these cases, your health care provider may recommend more frequent screening and Pap tests.  Colorectal Cancer  This type of cancer can be detected and often  prevented.  Routine colorectal cancer screening usually begins at 38 years of age and continues through 38 years of age.  Your health care provider may recommend screening at an earlier age if you have risk factors for colon cancer.  Your health care provider may also recommend using home test kits to check for hidden blood in the stool.  A small camera at the end of  a tube can be used to examine your colon directly (sigmoidoscopy or colonoscopy). This is done to check for the earliest forms of colorectal cancer.  Routine screening usually begins at age 45.  Direct examination of the colon should be repeated every 5-10 years through 38 years of age. However, you may need to be screened more often if early forms of precancerous polyps or small growths are found.  Skin Cancer  Check your skin from head to toe regularly.  Tell your health care provider about any new moles or changes in moles, especially if there is a change in a mole's shape or color.  Also tell your health care provider if you have a mole that is larger than the size of a pencil eraser.  Always use sunscreen. Apply sunscreen liberally and repeatedly throughout the day.  Protect yourself by wearing long sleeves, pants, a wide-brimmed hat, and sunglasses whenever you are outside.  Heart disease, diabetes, and high blood pressure  High blood pressure causes heart disease and increases the risk of stroke. High blood pressure is more likely to develop in: ? People who have blood pressure in the high end of the normal range (130-139/85-89 mm Hg). ? People who are overweight or obese. ? People who are African American.  If you are 65-44 years of age, have your blood pressure checked every 3-5 years. If you are 35 years of age or older, have your blood pressure checked every year. You should have your blood pressure measured twice-once when you are at a hospital or clinic, and once when you are not at a hospital or clinic.  Record the average of the two measurements. To check your blood pressure when you are not at a hospital or clinic, you can use: ? An automated blood pressure machine at a pharmacy. ? A home blood pressure monitor.  If you are between 55 years and 4 years old, ask your health care provider if you should take aspirin to prevent strokes.  Have regular diabetes screenings. This involves taking a blood sample to check your fasting blood sugar level. ? If you are at a normal weight and have a low risk for diabetes, have this test once every three years after 38 years of age. ? If you are overweight and have a high risk for diabetes, consider being tested at a younger age or more often. Preventing infection Hepatitis B  If you have a higher risk for hepatitis B, you should be screened for this virus. You are considered at high risk for hepatitis B if: ? You were born in a country where hepatitis B is common. Ask your health care provider which countries are considered high risk. ? Your parents were born in a high-risk country, and you have not been immunized against hepatitis B (hepatitis B vaccine). ? You have HIV or AIDS. ? You use needles to inject street drugs. ? You live with someone who has hepatitis B. ? You have had sex with someone who has hepatitis B. ? You get hemodialysis treatment. ? You take certain medicines for conditions, including cancer, organ transplantation, and autoimmune conditions.  Hepatitis C  Blood testing is recommended for: ? Everyone born from [birth year] through 1967. ? Anyone with known risk factors for hepatitis C.  Sexually transmitted infections (STIs)  You should be screened for sexually transmitted infections (STIs) including gonorrhea and chlamydia if: ? You are sexually active and are younger than 38 years of age. ? You are older than  38 years of age and your health care provider tells you that you are at risk for this type of infection. ? Your sexual  activity has changed since you were last screened and you are at an increased risk for chlamydia or gonorrhea. Ask your health care provider if you are at risk.  If you do not have HIV, but are at risk, it may be recommended that you take a prescription medicine daily to prevent HIV infection. This is called pre-exposure prophylaxis (PrEP). You are considered at risk if: ? You are sexually active and do not regularly use condoms or know the HIV status of your partner(s). ? You take drugs by injection. ? You are sexually active with a partner who has HIV.  Talk with your health care provider about whether you are at high risk of being infected with HIV. If you choose to begin PrEP, you should first be tested for HIV. You should then be tested every 3 months for as long as you are taking PrEP. Pregnancy  If you are premenopausal and you may become pregnant, ask your health care provider about preconception counseling.  If you may become pregnant, take 400 to 800 micrograms (mcg) of folic acid every day.  If you want to prevent pregnancy, talk to your health care provider about birth control (contraception). Osteoporosis and menopause  Osteoporosis is a disease in which the bones lose minerals and strength with aging. This can result in serious bone fractures. Your risk for osteoporosis can be identified using a bone density scan.  If you are 29 years of age or older, or if you are at risk for osteoporosis and fractures, ask your health care provider if you should be screened.  Ask your health care provider whether you should take a calcium or vitamin D supplement to lower your risk for osteoporosis.  Menopause may have certain physical symptoms and risks.  Hormone replacement therapy may reduce some of these symptoms and risks. Talk to your health care provider about whether hormone replacement therapy is right for you. Follow these instructions at home:  Schedule regular health, dental,  and eye exams.  Stay current with your immunizations.  Do not use any tobacco products including cigarettes, chewing tobacco, or electronic cigarettes.  If you are pregnant, do not drink alcohol.  If you are breastfeeding, limit how much and how often you drink alcohol.  Limit alcohol intake to no more than 1 drink per day for nonpregnant women. One drink equals 12 ounces of beer, 5 ounces of wine, or 1 ounces of hard liquor.  Do not use street drugs.  Do not share needles.  Ask your health care provider for help if you need support or information about quitting drugs.  Tell your health care provider if you often feel depressed.  Tell your health care provider if you have ever been abused or do not feel safe at home. This information is not intended to replace advice given to you by your health care provider. Make sure you discuss any questions you have with your health care provider. Document Released: 12/20/2010 Document Revised: 11/12/2015 Document Reviewed: 03/10/2015 Elsevier Interactive Patient Education  [employer].

## 2017-03-14 NOTE — Progress Notes (Signed)
Kelsey Cameron 05/01/1981 [id number]    History:    Presents for annual exam.  Monthly cycle/condoms/same partner. Normal Pap history. Mother diagnosed with breast cancer at age 38 BRCA status unknown. Hypertension primary care manages labs and meds.  Past medical history, past surgical history, family history and social history were all reviewed and documented in the EPIC chart. Works for [employer]. Daughter 10 doing well.  ROS:  A ROS was performed and pertinent positives and negatives are included.  Exam:  Vitals:   03/14/17 1029  BP: 126/80  Weight: 221 lb (100.2 kg)  Height: '5\' 4"'  (1.626 m)   Body mass index is 37.93 kg/m.   General appearance:  Normal Thyroid:  Symmetrical, normal in size, without palpable masses or nodularity. Respiratory  Auscultation:  Clear without wheezing or rhonchi Cardiovascular  Auscultation:  Regular rate, without rubs, murmurs or gallops  Edema/varicosities:  Not grossly evident Abdominal  Soft,nontender, without masses, guarding or rebound.  Liver/spleen:  No organomegaly noted  Hernia:  None appreciated  Skin  Inspection:  Grossly normal   Breasts: Examined lying and sitting. History of breast reduction    Right: Without masses, retractions, discharge or axillary adenopathy.     Left: Without masses, retractions, discharge or axillary adenopathy. Gentitourinary   Inguinal/mons:  Normal without inguinal adenopathy  External genitalia:  Normal  BUS/Urethra/Skene's glands:  Normal  Vagina:  Moderate white discharge, positive clues, TNTC bacteria  Cervix:  Normal  Uterus:   normal in size, shape and contour.  Midline and mobile  Adnexa/parametria:     Rt: Without masses or tenderness.   Lt: Without masses or tenderness.  Anus and perineum: Normal  Digital rectal exam: Normal sphincter tone without palpated masses or tenderness  Assessment/Plan:  36 y.o. SBF G2 P1 for annual exam with complaint of weight gain despite exercise and  dietary changes.  Monthly cycle/condoms STD screening per request Bacteria vaginosis Obesity Hypertension-primary care manages labs and meds  Plan: Flagyl 500 twice daily for 7 days, alcohol precautions reviewed. Diflucan 150 by mouth 1 dose per request, Rx given. SBE's, annual screening mammogram at 40, continue regular exercise, low calorie/carb diet, Weight Watchers encouraged. Contraception options reviewed will continue condoms. No known exposure, GC/Chlamydia, HIV, hep B, C, RPR. Pap with HR HPV typing, new screening guidelines reviewed.    [place], 1:08 PM 03/14/2017

## 2017-03-15 LAB — C. TRACHOMATIS/N. GONORRHOEAE RNA
C. trachomatis RNA, TMA: NOT DETECTED
N. gonorrhoeae RNA, TMA: NOT DETECTED

## 2017-03-15 LAB — URINALYSIS W MICROSCOPIC + REFLEX CULTURE
BILIRUBIN URINE: NEGATIVE
Bacteria, UA: NONE SEEN /HPF
GLUCOSE, UA: NEGATIVE
Hgb urine dipstick: NEGATIVE
Hyaline Cast: NONE SEEN /LPF
Ketones, ur: NEGATIVE
Leukocyte Esterase: NEGATIVE
NITRITES URINE, INITIAL: NEGATIVE
Protein, ur: NEGATIVE
SPECIFIC GRAVITY, URINE: 1.023 (ref 1.001–1.03)
WBC, UA: NONE SEEN /HPF (ref 0–5)
pH: 6.5 (ref 5.0–8.0)

## 2017-03-15 LAB — HIV ANTIBODY (ROUTINE TESTING W REFLEX): HIV: NONREACTIVE

## 2017-03-15 LAB — HEPATITIS C ANTIBODY
Hepatitis C Ab: NONREACTIVE
SIGNAL TO CUT-OFF: 0.01 (ref <1.00)

## 2017-03-15 LAB — HEPATITIS B SURFACE ANTIGEN: HEP B S AG: NONREACTIVE

## 2017-03-15 LAB — RPR: RPR Ser Ql: NONREACTIVE

## 2017-03-15 LAB — NO CULTURE INDICATED

## 2017-03-22 LAB — PAP, TP IMAGING W/ HPV RNA, RFLX HPV TYPE 16,18/45: HPV DNA HIGH RISK: NOT DETECTED

## 2017-05-18 ENCOUNTER — Encounter: Payer: Self-pay | Admitting: Adult Health

## 2017-05-18 ENCOUNTER — Ambulatory Visit (INDEPENDENT_AMBULATORY_CARE_PROVIDER_SITE_OTHER): Payer: 59 | Admitting: Adult Health

## 2017-05-18 VITALS — BP 150/100 | Temp 98.4°F | Wt 218.0 lb

## 2017-05-18 DIAGNOSIS — Z760 Encounter for issue of repeat prescription: Secondary | ICD-10-CM | POA: Diagnosis not present

## 2017-05-18 DIAGNOSIS — J0141 Acute recurrent pansinusitis: Secondary | ICD-10-CM | POA: Diagnosis not present

## 2017-05-18 MED ORDER — ATENOLOL 25 MG PO TABS: 25.0000 mg | ORAL_TABLET | Freq: Every day | ORAL | 2 refills | Status: DC

## 2017-05-18 MED ORDER — AMLODIPINE BESYLATE 10 MG PO TABS: 10.0000 mg | ORAL_TABLET | Freq: Every day | ORAL | 2 refills | Status: DC

## 2017-05-18 MED ORDER — BENAZEPRIL-HYDROCHLOROTHIAZIDE 20-25 MG PO TABS: 1.0000 | ORAL_TABLET | Freq: Every day | ORAL | 2 refills | Status: DC

## 2017-05-18 MED ORDER — OMEPRAZOLE 20 MG PO CPDR: 20.0000 mg | DELAYED_RELEASE_CAPSULE | Freq: Every day | ORAL | 3 refills | Status: DC

## 2017-05-18 MED ORDER — DOXYCYCLINE HYCLATE 100 MG PO CAPS: 100.0000 mg | ORAL_CAPSULE | Freq: Two times a day (BID) | ORAL | 0 refills | Status: DC

## 2017-05-18 NOTE — Progress Notes (Signed)
Subjective:    Patient ID: Kelsey Cameron, female    DOB: July 01, 1980, 36 y.o.   MRN: [id number]  Sinusitis  This is a new problem. The current episode started 1 to 4 weeks ago (1-2 weeks ). The problem has been gradually worsening since onset. There has been no fever. Associated symptoms include congestion, coughing, headaches, sinus pressure and a sore throat. Pertinent negatives include no chills, ear pain or shortness of breath. Past treatments include oral decongestants, spray decongestants and saline sprays.   She also needs refills of her blood pressure medication as she has been out of her meds " for awhile"   Review of Systems  Constitutional: Positive for activity change and fatigue. Negative for appetite change, chills and fever.  HENT: Positive for congestion, postnasal drip, rhinorrhea, sinus pressure, sinus pain and sore throat. Negative for ear discharge, ear pain, tinnitus and trouble swallowing.   Respiratory: Positive for cough. Negative for shortness of breath and wheezing.   Cardiovascular: Negative.   Neurological: Positive for headaches.   Past Medical History:  Diagnosis Date  . Hypertension   . Macromastia   . PONV (postoperative nausea and vomiting)   . STD (sexually transmitted disease) 2002   [place]    Social History   Socioeconomic History  . Marital status: Single    Spouse name: Not on file  . Number of children: Not on file  . Years of education: Not on file  . Highest education level: Not on file  Social Needs  . Financial resource strain: Not on file  . Food insecurity - worry: Not on file  . Food insecurity - inability: Not on file  . Transportation needs - medical: Not on file  . Transportation needs - non-medical: Not on file  Occupational History  . Not on file  Tobacco Use  . Smoking status: Former Smoker    Types: Cigarettes  . Smokeless tobacco: Never Used  . Tobacco comment: quit smoking 10/2010  Substance and Sexual  Activity  . Alcohol use: No    Alcohol/week: 0.0 oz    Comment: 1 drink/month  . Drug use: No  . Sexual activity: No    Comment: INTERCOUSRE AGE 13,SEXUAL PARTNERS MORE THAN 5   Other Topics Concern  . Not on file  Social History Narrative   Works at [employer]   Has one child    Single       She likes to travel, work outside in her yard.     Past Surgical History:  Procedure Laterality Date  . BREAST REDUCTION SURGERY  05/17/2011   Procedure: MAMMARY REDUCTION BILATERAL (BREAST);  Surgeon: Mary A Contogiannis;  Location: [place];  Service: Plastics;  Laterality: Bilateral;  . CESAREAN SECTION  09/25/2006  . CHOLECYSTECTOMY  2009    Family History  Problem Relation Age of Onset  . Aneurysm Mother   . Hypertension Mother   . Breast cancer Mother 51  . Aneurysm Father   . Hypertension Father   . Heart disease Father   . Breast cancer Maternal Aunt   . Breast cancer Maternal Grandmother     Allergies  Allergen Reactions  . Amoxicillin Anaphylaxis and Hives    Current Outpatient Medications on File Prior to Visit  Medication Sig Dispense Refill  . Multiple Vitamin (MULTIVITAMIN) capsule Take 1 capsule by mouth daily. Reported on 07/09/2015    . amLODipine (NORVASC) 10 MG tablet Take 10 mg by mouth daily.    Marland Kitchen  atenolol (TENORMIN) 25 MG tablet Take 25 mg by mouth daily.    . benazepril-hydrochlorthiazide (LOTENSIN HCT) 20-25 MG tablet Take 1 tablet by mouth daily.     No current facility-administered medications on file prior to visit.     BP (!) 150/100 (BP Location: Left Arm)   Temp 98.4 F (36.9 C) (Oral)   Wt 218 lb (98.9 kg)   BMI 37.42 kg/m       Objective:   Physical Exam  Constitutional: She is oriented to person, place, and time. She appears well-developed and well-nourished. No distress.  HENT:  Head: Normocephalic and atraumatic.  Right Ear: Hearing, tympanic membrane, external ear and ear canal normal.  Left Ear: Hearing, tympanic  membrane, external ear and ear canal normal.  Nose: Mucosal edema and rhinorrhea present. Right sinus exhibits maxillary sinus tenderness and frontal sinus tenderness. Left sinus exhibits maxillary sinus tenderness and frontal sinus tenderness.  Mouth/Throat: Uvula is midline and mucous membranes are normal. Oropharyngeal exudate present.  Cardiovascular: Normal rate, regular rhythm, normal heart sounds and intact distal pulses. Exam reveals no gallop.  No murmur heard. Pulmonary/Chest: Effort normal and breath sounds normal. No respiratory distress. She has no wheezes. She has no rales. She exhibits no tenderness.  Neurological: She is alert and oriented to person, place, and time.  Skin: Skin is warm and dry. No rash noted. She is not diaphoretic. No erythema. No pallor.  Psychiatric: She has a normal mood and affect. Her behavior is normal. Judgment and thought content normal.  Vitals reviewed.     Assessment & Plan:  1. Acute recurrent pansinusitis - Will treat with abx due to time duration and symptoms.  - doxycycline (VIBRAMYCIN) 100 MG capsule; Take 1 capsule (100 mg total) by mouth 2 (two) times daily.  Dispense: 20 capsule; Refill: 0 - Continue with flonase  - Rest and stay hydrated  - Follow up if no improvement in the next 2-3 days   2. Medication refill  - amLODipine (NORVASC) 10 MG tablet; Take 1 tablet (10 mg total) by mouth daily.  Dispense: 30 tablet; Refill: 2 - benazepril-hydrochlorthiazide (LOTENSIN HCT) 20-25 MG tablet; Take 1 tablet by mouth daily.  Dispense: 30 tablet; Refill: 2 - atenolol (TENORMIN) 25 MG tablet; Take 1 tablet (25 mg total) by mouth daily.  Dispense: 30 tablet; Refill: 2 - omeprazole (PRILOSEC) 20 MG capsule; Take 1 capsule (20 mg total) by mouth daily.  Dispense: 90 capsule; Refill: 3   Kelsey Peng, NP

## 2017-05-24 ENCOUNTER — Encounter: Payer: 59 | Admitting: Adult Health

## 2017-05-24 NOTE — Progress Notes (Deleted)
Subjective:    Patient ID: Kelsey Cameron, female    DOB: 1981-05-07, 36 y.o.   MRN: [id number]  HPI  Patient presents for yearly preventative medicine examination. He is a pleasant 36 year old female who  has a past medical history of Hypertension, Macromastia, PONV (postoperative nausea and vomiting), and STD (sexually transmitted disease) (2002).  She takes Norvasc 10 mg, atenolol 25 mg, and Lotensin HCT for blood pressure control.   She takes Omeprazole for GERD  All immunizations and health maintenance protocols were reviewed with the patient and needed orders were placed.  Appropriate screening laboratory values were ordered for the patient including screening of hyperlipidemia, renal function and hepatic function. If indicated by BPH, a PSA was ordered.  Medication reconciliation,  past medical history, social history, problem list and allergies were reviewed in detail with the patient  Goals were established with regard to weight loss, exercise, and  diet in compliance with medications  She has been seen by GYN this year and is UTD on pap.   She denies any interval history   Review of Systems  Constitutional: Negative.   HENT: Negative.   Eyes: Negative.   Respiratory: Negative.   Cardiovascular: Negative.   Gastrointestinal: Negative.   Endocrine: Negative.   Genitourinary: Negative.   Musculoskeletal: Negative.   Skin: Negative.   Allergic/Immunologic: Negative.   Neurological: Negative.   Hematological: Negative.   Psychiatric/Behavioral: Negative.    Past Medical History:  Diagnosis Date  . Hypertension   . Macromastia   . PONV (postoperative nausea and vomiting)   . STD (sexually transmitted disease) 2002   [place]    Social History   Socioeconomic History  . Marital status: Single    Spouse name: Not on file  . Number of children: Not on file  . Years of education: Not on file  . Highest education level: Not on file  Social Needs  .  Financial resource strain: Not on file  . Food insecurity - worry: Not on file  . Food insecurity - inability: Not on file  . Transportation needs - medical: Not on file  . Transportation needs - non-medical: Not on file  Occupational History  . Not on file  Tobacco Use  . Smoking status: Former Smoker    Types: Cigarettes  . Smokeless tobacco: Never Used  . Tobacco comment: quit smoking 10/2010  Substance and Sexual Activity  . Alcohol use: No    Alcohol/week: 0.0 oz    Comment: 1 drink/month  . Drug use: No  . Sexual activity: No    Comment: INTERCOUSRE AGE 57,SEXUAL PARTNERS MORE THAN 5   Other Topics Concern  . Not on file  Social History Narrative   Works at [employer]   Has one child    Single       She likes to travel, work outside in her yard.     Past Surgical History:  Procedure Laterality Date  . BREAST REDUCTION SURGERY  05/17/2011   Procedure: MAMMARY REDUCTION BILATERAL (BREAST);  Surgeon: Mary A Contogiannis;  Location: [place];  Service: Plastics;  Laterality: Bilateral;  . CESAREAN SECTION  09/25/2006  . CHOLECYSTECTOMY  2009    Family History  Problem Relation Age of Onset  . Aneurysm Mother   . Hypertension Mother   . Breast cancer Mother 71  . Aneurysm Father   . Hypertension Father   . Heart disease Father   . Breast cancer Maternal Aunt   .  Breast cancer Maternal Grandmother     Allergies  Allergen Reactions  . Amoxicillin Anaphylaxis and Hives    Current Outpatient Medications on File Prior to Visit  Medication Sig Dispense Refill  . amLODipine (NORVASC) 10 MG tablet Take 1 tablet (10 mg total) by mouth daily. 30 tablet 2  . atenolol (TENORMIN) 25 MG tablet Take 1 tablet (25 mg total) by mouth daily. 30 tablet 2  . benazepril-hydrochlorthiazide (LOTENSIN HCT) 20-25 MG tablet Take 1 tablet by mouth daily. 30 tablet 2  . doxycycline (VIBRAMYCIN) 100 MG capsule Take 1 capsule (100 mg total) by mouth 2 (two) times daily. 20  capsule 0  . Multiple Vitamin (MULTIVITAMIN) capsule Take 1 capsule by mouth daily. Reported on 07/09/2015    . omeprazole (PRILOSEC) 20 MG capsule Take 1 capsule (20 mg total) by mouth daily. 90 capsule 3   No current facility-administered medications on file prior to visit.     There were no vitals taken for this visit.      Objective:   Physical Exam  Constitutional: She is oriented to person, place, and time. She appears well-developed and well-nourished. No distress.  HENT:  Head: Normocephalic and atraumatic.  Right Ear: External ear normal.  Left Ear: External ear normal.  Nose: Nose normal.  Mouth/Throat: Oropharynx is clear and moist. No oropharyngeal exudate.  Eyes: Conjunctivae and EOM are normal. Pupils are equal, round, and reactive to light. Right eye exhibits no discharge. Left eye exhibits no discharge. No scleral icterus.  Neck: Normal range of motion. Neck supple. No JVD present. No tracheal deviation present. No thyromegaly present.  Cardiovascular: Normal rate, regular rhythm, normal heart sounds and intact distal pulses. Exam reveals no gallop and no friction rub.  No murmur heard. Pulmonary/Chest: Effort normal and breath sounds normal. No stridor. No respiratory distress. She has no wheezes. She has no rales. She exhibits no tenderness.  Abdominal: Soft. Bowel sounds are normal. She exhibits no distension and no mass. There is no tenderness. There is no rebound and no guarding.  Genitourinary: Rectal exam shows guaiac negative stool. No vaginal discharge found.  Genitourinary Comments: Done by GYN    Musculoskeletal: Normal range of motion. She exhibits no edema, tenderness or deformity.  Lymphadenopathy:    She has no cervical adenopathy.  Neurological: She is alert and oriented to person, place, and time. She has normal reflexes. She displays normal reflexes. No cranial nerve deficit. She exhibits normal muscle tone. Coordination normal.  Skin: Skin is warm  and dry. No rash noted. She is not diaphoretic. No erythema. No pallor.  Psychiatric: She has a normal mood and affect. Her behavior is normal. Judgment and thought content normal.  Nursing note and vitals reviewed.     Assessment & Plan:

## 2017-06-06 ENCOUNTER — Encounter: Payer: 59 | Admitting: Adult Health

## 2017-06-15 ENCOUNTER — Telehealth: Payer: Self-pay | Admitting: Adult Health

## 2017-06-15 NOTE — Telephone Encounter (Signed)
Pt scheduled for 06/16/17

## 2017-06-15 NOTE — Telephone Encounter (Signed)
Copied from [place] [id number]. Topic: Quick Communication - See Telephone Encounter >> Jun 15, 2017 12:12 PM Ivar Drape wrote: CRM for notification. See Telephone encounter for:  06/15/17. Patient said she has finished the antibiotic given her for the sinus infection, but she is still feeling sinus pressure, nausea and a runny nose.  Please advise.

## 2017-06-15 NOTE — Telephone Encounter (Signed)
Left a message for a return call.  Pt needs to come in for appt per [facility].

## 2017-06-16 ENCOUNTER — Encounter: Payer: Self-pay | Admitting: Adult Health

## 2017-06-16 ENCOUNTER — Ambulatory Visit (INDEPENDENT_AMBULATORY_CARE_PROVIDER_SITE_OTHER): Payer: 59 | Admitting: Adult Health

## 2017-06-16 VITALS — BP 144/100 | Temp 98.3°F | Wt 217.0 lb

## 2017-06-16 DIAGNOSIS — J321 Chronic frontal sinusitis: Secondary | ICD-10-CM

## 2017-06-16 MED ORDER — ONDANSETRON HCL 4 MG PO TABS: 4.0000 mg | ORAL_TABLET | Freq: Three times a day (TID) | ORAL | 0 refills | Status: DC | PRN

## 2017-06-16 NOTE — Progress Notes (Signed)
Subjective:    Patient ID: Kelsey Cameron, female    DOB: 08/05/1980, 36 y.o.   MRN: [id number]  She was seen one month ago and was treated for sinusitis with Doxycycline. She reports that doxycycline caused her to have a "severe upset stomach and made her have panic attacks".    Sinusitis  This is a chronic problem. The current episode started 1 to 4 weeks ago. The problem is unchanged. There has been no fever. Associated symptoms include congestion, headaches and sinus pressure. Pertinent negatives include no shortness of breath, sore throat or swollen glands. Past treatments include antibiotics. The treatment provided no relief.    Review of Systems  Constitutional: Negative.   HENT: Positive for congestion, postnasal drip, rhinorrhea, sinus pressure and sinus pain. Negative for sore throat.   Respiratory: Negative for shortness of breath.   Gastrointestinal: Positive for nausea and vomiting. Negative for abdominal pain, anal bleeding, blood in stool, diarrhea and rectal pain.  Neurological: Positive for headaches.   Past Medical History:  Diagnosis Date  . Hypertension   . Macromastia   . PONV (postoperative nausea and vomiting)   . STD (sexually transmitted disease) 2002   [place]    Social History   Socioeconomic History  . Marital status: Single    Spouse name: Not on file  . Number of children: Not on file  . Years of education: Not on file  . Highest education level: Not on file  Social Needs  . Financial resource strain: Not on file  . Food insecurity - worry: Not on file  . Food insecurity - inability: Not on file  . Transportation needs - medical: Not on file  . Transportation needs - non-medical: Not on file  Occupational History  . Not on file  Tobacco Use  . Smoking status: Former Smoker    Types: Cigarettes  . Smokeless tobacco: Never Used  . Tobacco comment: quit smoking 10/2010  Substance and Sexual Activity  . Alcohol use: No   Alcohol/week: 0.0 oz    Comment: 1 drink/month  . Drug use: No  . Sexual activity: No    Comment: INTERCOUSRE AGE 74,SEXUAL PARTNERS MORE THAN 5   Other Topics Concern  . Not on file  Social History Narrative   Works at [employer]   Has one child    Single       She likes to travel, work outside in her yard.     Past Surgical History:  Procedure Laterality Date  . BREAST REDUCTION SURGERY  05/17/2011   Procedure: MAMMARY REDUCTION BILATERAL (BREAST);  Surgeon: Mary A Contogiannis;  Location: [place];  Service: Plastics;  Laterality: Bilateral;  . CESAREAN SECTION  09/25/2006  . CHOLECYSTECTOMY  2009    Family History  Problem Relation Age of Onset  . Aneurysm Mother   . Hypertension Mother   . Breast cancer Mother 39  . Aneurysm Father   . Hypertension Father   . Heart disease Father   . Breast cancer Maternal Aunt   . Breast cancer Maternal Grandmother     Allergies  Allergen Reactions  . Amoxicillin Anaphylaxis and Hives  . Doxycycline     Current Outpatient Medications on File Prior to Visit  Medication Sig Dispense Refill  . amLODipine (NORVASC) 10 MG tablet Take 1 tablet (10 mg total) by mouth daily. 30 tablet 2  . atenolol (TENORMIN) 25 MG tablet Take 1 tablet (25 mg total) by mouth daily. 30 tablet 2  .  benazepril-hydrochlorthiazide (LOTENSIN HCT) 20-25 MG tablet Take 1 tablet by mouth daily. 30 tablet 2  . Multiple Vitamin (MULTIVITAMIN) capsule Take 1 capsule by mouth daily. Reported on 07/09/2015    . omeprazole (PRILOSEC) 20 MG capsule Take 1 capsule (20 mg total) by mouth daily. 90 capsule 3   No current facility-administered medications on file prior to visit.     BP (!) 144/100   Temp 98.3 F (36.8 C) (Oral)   Wt 217 lb (98.4 kg)   BMI 37.25 kg/m       Objective:   Physical Exam  Constitutional: She is oriented to person, place, and time. She appears well-developed and well-nourished. No distress.  HENT:  Nose: No mucosal edema  or rhinorrhea. Right sinus exhibits frontal sinus tenderness. Right sinus exhibits no maxillary sinus tenderness. Left sinus exhibits frontal sinus tenderness. Left sinus exhibits no maxillary sinus tenderness.  Mouth/Throat: Uvula is midline, oropharynx is clear and moist and mucous membranes are normal.  Cardiovascular: Normal rate, regular rhythm, normal heart sounds and intact distal pulses. Exam reveals no gallop and no friction rub.  No murmur heard. Pulmonary/Chest: Effort normal and breath sounds normal. No respiratory distress. She has no wheezes. She has no rales. She exhibits no tenderness.  Musculoskeletal: Normal range of motion. She exhibits no edema, tenderness or deformity.  Neurological: She is alert and oriented to person, place, and time.  Skin: She is not diaphoretic.  Nursing note and vitals reviewed.     Assessment & Plan:  1. Chronic frontal sinusitis - She does not appear to need another round of antibiotics. She has been seen by ENT in the past and has had a CT scan of sinus completed. Both exams were basically normal. She is using flonase but has not found relief with this. I am going to have her be seen by [facility]. Switch nasal spray for Nasonex and try claritin. Will send in Zofran 4 mg to be taken for nausea.   [employer]

## 2017-06-28 ENCOUNTER — Encounter: Payer: Self-pay | Admitting: Medical

## 2017-06-28 ENCOUNTER — Telehealth: Payer: Self-pay | Admitting: Medical

## 2017-06-28 ENCOUNTER — Ambulatory Visit (INDEPENDENT_AMBULATORY_CARE_PROVIDER_SITE_OTHER): Payer: 59 | Admitting: Medical

## 2017-06-28 ENCOUNTER — Ambulatory Visit (HOSPITAL_BASED_OUTPATIENT_CLINIC_OR_DEPARTMENT_OTHER)
Admission: RE | Admit: 2017-06-28 | Discharge: 2017-06-28 | Disposition: A | Discharge status: Home / Self Care | Payer: 59 | Source: Ambulatory Visit | Attending: Medical | Admitting: Medical

## 2017-06-28 VITALS — BP 157/101 | HR 82 | Temp 98.7°F | Resp 16 | Ht 64.0 in | Wt 220.8 lb

## 2017-06-28 DIAGNOSIS — R0781 Pleurodynia: Secondary | ICD-10-CM | POA: Diagnosis not present

## 2017-06-28 DIAGNOSIS — I517 Cardiomegaly: Secondary | ICD-10-CM | POA: Diagnosis not present

## 2017-06-28 DIAGNOSIS — R0789 Other chest pain: Secondary | ICD-10-CM

## 2017-06-28 DIAGNOSIS — S161XXA Strain of muscle, fascia and tendon at neck level, initial encounter: Secondary | ICD-10-CM | POA: Diagnosis not present

## 2017-06-28 DIAGNOSIS — M25521 Pain in right elbow: Secondary | ICD-10-CM

## 2017-06-28 DIAGNOSIS — S52134A Nondisplaced fracture of neck of right radius, initial encounter for closed fracture: Secondary | ICD-10-CM | POA: Insufficient documentation

## 2017-06-28 DIAGNOSIS — M25531 Pain in right wrist: Secondary | ICD-10-CM

## 2017-06-28 MED ORDER — IBUPROFEN 200 MG PO TABS: ORAL_TABLET | ORAL | 1 refills | Status: DC

## 2017-06-28 MED ORDER — CYCLOBENZAPRINE HCL 10 MG PO TABS: 10.0000 mg | ORAL_TABLET | Freq: Every day | ORAL | 0 refills | Status: DC
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
## 2017-06-28 NOTE — Telephone Encounter (Signed)
Open to review.  

## 2017-06-30 ENCOUNTER — Telehealth: Payer: Self-pay | Admitting: Medical

## 2017-06-30 NOTE — Telephone Encounter (Signed)
I noticed today that patient's sports medicine referral appointment appears not to be made yet?  Is at the case?  Could we get her scheduled on Monday?  Can you call around to see maybe if someone can see her today?

## 2017-07-03 NOTE — Telephone Encounter (Signed)
Pt wanted appointment in [place]. Gwen had to go back in put in for [facility]

## 2017-09-13 ENCOUNTER — Encounter: Payer: Self-pay | Admitting: Women's Health

## 2017-09-13 ENCOUNTER — Ambulatory Visit (INDEPENDENT_AMBULATORY_CARE_PROVIDER_SITE_OTHER): Payer: 59 | Admitting: Women's Health

## 2017-09-13 VITALS — Wt 222.0 lb

## 2017-09-13 DIAGNOSIS — B9689 Other specified bacterial agents as the cause of diseases classified elsewhere: Secondary | ICD-10-CM

## 2017-09-13 DIAGNOSIS — N760 Acute vaginitis: Secondary | ICD-10-CM | POA: Insufficient documentation

## 2017-09-13 LAB — WET PREP FOR TRICH, YEAST, CLUE

## 2017-09-13 MED ORDER — METRONIDAZOLE 0.75 % VA GEL: VAGINAL | 2 refills | Status: DC

## 2017-09-13 NOTE — Progress Notes (Signed)
37 year old SBF G2 P1 presents with complaint of vaginal odor for the past week, notices it especially each month after cycle.  Monthly cycle/condoms/same partner.  History of BV in the past similar symptoms.  Denies urinary symptoms, abdominal pain, vaginal itching or fever.Hypertension primary care manages.    Exam: Appears well.  Abdomen obese, nontender, no rebound or radiation of discomfort.  External genitalia within normal limits, speculum exam moderate amount of a white adherent discharge with odor noted, wet prep positive for clues, many bacteria.  Bimanual no CMT or adnexal tenderness.  Recurrent bacterial vaginosis  Plan: Options reviewed, MetroGel vaginal cream at bedtime for 5 nights, alcohol precautions reviewed, will then try 1 applicator each month after cycle ends.  Contraception options reviewed and declined  will continue condoms, states she uses consistently.

## 2017-09-13 NOTE — Patient Instructions (Signed)

## 2017-09-14 ENCOUNTER — Telehealth: Payer: Self-pay | Admitting: *Deleted

## 2017-09-14 MED ORDER — NONFORMULARY OR COMPOUNDED ITEM: 0 refills | Status: DC

## 2017-09-14 MED ORDER — METRONIDAZOLE 500 MG PO TABS: 500.0000 mg | ORAL_TABLET | Freq: Two times a day (BID) | ORAL | 0 refills | Status: DC

## 2017-09-14 NOTE — Telephone Encounter (Signed)
Pt informed, Rx called in for boric acid.

## 2017-09-14 NOTE — Telephone Encounter (Signed)
Pt was seen yesterday called c/o metrogel cost is too expensive asked if pill form can be prescribed? Please advise

## 2017-09-14 NOTE — Telephone Encounter (Signed)
Ok flagyl 500mg  bid for 7 days. Could try boric acid gel caps for prevention, (hx of recurrence) explain twice weekly apply vag for prevention of BV recurrence.please escribe for pt to [place]   thanks

## 2017-10-18 ENCOUNTER — Encounter: Payer: Self-pay | Admitting: Family Medicine

## 2017-10-18 ENCOUNTER — Ambulatory Visit (INDEPENDENT_AMBULATORY_CARE_PROVIDER_SITE_OTHER): Payer: 59 | Admitting: Family Medicine

## 2017-10-18 ENCOUNTER — Encounter: Payer: Self-pay | Admitting: *Deleted

## 2017-10-18 VITALS — BP 124/72 | HR 68 | Temp 98.8°F | Resp 12 | Ht 64.0 in | Wt 220.5 lb

## 2017-10-18 DIAGNOSIS — J989 Respiratory disorder, unspecified: Secondary | ICD-10-CM

## 2017-10-18 DIAGNOSIS — R0989 Other specified symptoms and signs involving the circulatory and respiratory systems: Secondary | ICD-10-CM | POA: Diagnosis not present

## 2017-10-18 DIAGNOSIS — R05 Cough: Secondary | ICD-10-CM | POA: Diagnosis not present

## 2017-10-18 DIAGNOSIS — R059 Cough, unspecified: Secondary | ICD-10-CM

## 2017-10-18 MED ORDER — BENZONATATE 100 MG PO CAPS: 200.0000 mg | ORAL_CAPSULE | Freq: Two times a day (BID) | ORAL | 0 refills | Status: AC | PRN

## 2017-10-18 MED ORDER — ALBUTEROL SULFATE HFA 108 (90 BASE) MCG/ACT IN AERS: 2.0000 | INHALATION_SPRAY | Freq: Four times a day (QID) | RESPIRATORY_TRACT | 0 refills | Status: DC | PRN

## 2017-10-18 NOTE — Progress Notes (Signed)
ACUTE VISIT  HPI:  Chief Complaint  Patient presents with  . Cough    started over 1 week ago  . Chest congestion  . Laryngitis    Kelsey Cameron is a 37 y.o.female here today complaining of 5-7 days of respiratory symptoms. She had sore throat, dysphonia,and nasal congestion. These symptoms resolved. Rhinorrhea and post nasal drainage have improved.  Now having non productive cough, worse in the morning. Cough spells causes nausea. No heartburn,abdominal pain,or vomiting.   Cough  This is a new problem. The current episode started in the past 7 days. The problem has been gradually improving. The cough is non-productive. Associated symptoms include nasal congestion, postnasal drip, rhinorrhea, shortness of breath and wheezing. Pertinent negatives include no chest pain, ear congestion, ear pain, fever, headaches, heartburn, hemoptysis, myalgias, rash, sore throat, sweats or weight loss. The symptoms are aggravated by exercise. Treatments tried: OTC antihistaminic and nasal spray. The treatment provided mild relief. Her past medical history is significant for environmental allergies.   +Wheezing and fatigue. No Hx of asthma. Former smoker.  No Hx of recent travel. No sick contact. No known insect bite.  Hx of allergies: No known Hx.   Review of Systems  Constitutional: Positive for fatigue. Negative for activity change, appetite change, fever and weight loss.  HENT: Positive for congestion, postnasal drip and rhinorrhea. Negative for ear pain, sinus pressure, sore throat and voice change.   Respiratory: Positive for cough, shortness of breath and wheezing. Negative for hemoptysis and chest tightness.   Cardiovascular: Negative for chest pain, palpitations and leg swelling.  Gastrointestinal: Negative for abdominal pain, diarrhea, heartburn, nausea and vomiting.  Musculoskeletal: Negative for back pain and myalgias.  Skin: Negative for rash.    Allergic/Immunologic: Positive for environmental allergies.  Neurological: Negative for syncope, weakness and headaches.  Hematological: Negative for adenopathy. Does not bruise/bleed easily.      Current Outpatient Medications on File Prior to Visit  Medication Sig Dispense Refill  . amLODipine (NORVASC) 10 MG tablet Take 1 tablet (10 mg total) by mouth daily. 30 tablet 2  . atenolol (TENORMIN) 25 MG tablet Take 1 tablet (25 mg total) by mouth daily. 30 tablet 2  . benazepril-hydrochlorthiazide (LOTENSIN HCT) 20-25 MG tablet Take 1 tablet by mouth daily. 30 tablet 2  . metroNIDAZOLE (FLAGYL) 500 MG tablet Take 1 tablet (500 mg total) by mouth 2 (two) times daily. 14 tablet 0  . Multiple Vitamin (MULTIVITAMIN) capsule Take 1 capsule by mouth daily. Reported on 07/09/2015    . NONFORMULARY OR COMPOUNDED ITEM Boric acid gel caps 600 mg insert one tablet vaginally twice weekly 30 each 0  . omeprazole (PRILOSEC) 20 MG capsule Take 1 capsule (20 mg total) by mouth daily. 90 capsule 3   No current facility-administered medications on file prior to visit.      Past Medical History:  Diagnosis Date  . Hypertension   . Macromastia   . PONV (postoperative nausea and vomiting)   . STD (sexually transmitted disease) 2002   [place]   Allergies  Allergen Reactions  . Amoxicillin Anaphylaxis and Hives  . Doxycycline     Social History   Socioeconomic History  . Marital status: Single    Spouse name: Not on file  . Number of children: Not on file  . Years of education: Not on file  . Highest education level: Not on file  Occupational History  . Not on file  Social Needs  .  Financial resource strain: Not on file  . Food insecurity:    Worry: Not on file    Inability: Not on file  . Transportation needs:    Medical: Not on file    Non-medical: Not on file  Tobacco Use  . Smoking status: Former Smoker    Types: Cigarettes  . Smokeless tobacco: Never Used  . Tobacco comment:  quit smoking 10/2010  Substance and Sexual Activity  . Alcohol use: No    Alcohol/week: 0.0 oz    Frequency: Never  . Drug use: No  . Sexual activity: Not Currently    Comment: INTERCOUSRE AGE 80,SEXUAL PARTNERS MORE THAN 5   Lifestyle  . Physical activity:    Days per week: Not on file    Minutes per session: Not on file  . Stress: Not on file  Relationships  . Social connections:    Talks on phone: Not on file    Gets together: Not on file    Attends religious service: Not on file    Active member of club or organization: Not on file    Attends meetings of clubs or organizations: Not on file    Relationship status: Not on file  Other Topics Concern  . Not on file  Social History Narrative   Works at [employer]   Has one child    Single       She likes to travel, work outside in her yard.     Vitals:   10/18/17 1605  BP: 124/72  Pulse: 68  Resp: 12  Temp: 98.8 F (37.1 C)  SpO2: 100%   Body mass index is 37.85 kg/m.   Physical Exam  Nursing note and vitals reviewed. Constitutional: She is oriented to person, place, and time. She appears well-developed. She does not appear ill. No distress.  HENT:  Head: Normocephalic and atraumatic.  Mouth/Throat: Oropharynx is clear and moist and mucous membranes are normal.  Postnasal drainage.  Eyes: Conjunctivae are normal.  Cardiovascular: Normal rate and regular rhythm.  No murmur heard. Respiratory: Effort normal and breath sounds normal. No stridor. No respiratory distress. She has no wheezes. She has no rales.  Lymphadenopathy:    She has no cervical adenopathy.  Neurological: She is alert and oriented to person, place, and time. She has normal strength. Gait normal.  Skin: Skin is warm. No rash noted. No erythema.  Psychiatric: She has a normal mood and affect.  Well groomed, good eye contact.    ASSESSMENT AND PLAN:  Ms. Kelsey Cameron was seen today for cough, chest congestion and laryngitis.  Diagnoses and all  orders for this visit:  Cough  Possible etiologies discussed: Residual symptoms due to recent URI,allergies,GERD,and medications among some. After URI cough could last a few days and even weeks. Mucinex may help. I do not think imaging is needed today. F/U with PCP as needed.  -     benzonatate (TESSALON) 100 MG capsule; Take 2 capsules (200 mg total) by mouth 2 (two) times daily as needed for up to 10 days.  Reactive airway disease that is not asthma  Lung auscultation negative today. Albuterol inh 2 puff every 6 hours for a week then as needed for wheezing or shortness of breath.  F/U with PCP as needed.  -     albuterol (PROVENTIL HFA;VENTOLIN HFA) 108 (90 Base) MCG/ACT inhaler; Inhale 2 puffs into the lungs every 6 (six) hours as needed for wheezing or shortness of breath.   -Kelsey Cameron was instructed to seek attention immediately if symptoms worsen.      Liborio Saccente G. Martinique, MD  [facility]. [facility].

## 2017-10-18 NOTE — Patient Instructions (Addendum)
  Ms.Darcel N Lowrimore I have seen you today for an acute visit.  A few things to remember from today's visit:   Cough - Plan: benzonatate (TESSALON) 100 MG capsule  Reactive airway disease that is not asthma - Plan: albuterol (PROVENTIL HFA;VENTOLIN HFA) 108 (90 Base) MCG/ACT inhaler   Medications prescribed today are intended for short period of time and will not be refill upon request, a follow up appointment might be necessary to discuss continuation of of treatment if appropriate.  Today I did not hear wheezing, lungs sound clear.  Albuterol inh 2 puff every 6 hours for a week then as needed for wheezing or shortness of breath.    I will obtain x-rays needed today. Plain Mucinex might help with mucus. Continue Flonase nasal spray. Saline nose irrigation my also help.  In general please monitor for signs of worsening symptoms and seek immediate medical attention if any concerning.  If symptoms are not resolved in2cweeks you should schedule a follow up appointment with your doctor, before if needed.  I hope you get better soon!

## 2018-01-11 ENCOUNTER — Telehealth: Payer: Self-pay | Admitting: Adult Health

## 2018-01-11 NOTE — Telephone Encounter (Signed)
Copied from [place] [id number]. Topic: Quick Communication - Rx Refill/Question >> Jan 11, 2018  1:19 PM Bea Graff, NT wrote: Medication: omeprazole (PRILOSEC) 20 MG capsule, amLODipine (NORVASC) 10 MG tablet, atenolol (TENORMIN) 25 MG tablet, benazepril-hydrochlorthiazide (LOTENSIN HCT) 20-25 MG tablet  Has the patient contacted their pharmacy? Yes.   (Agent: If no, request that the patient contact the pharmacy for the refill.) (Agent: If yes, when and what did the pharmacy advise?)  Preferred Pharmacy (with phone number or street name): [facility], [place] [phone] (Phone) [phone] (Fax)      Agent: Please be advised that RX refills may take up to 3 business days. We ask that you follow-up with your pharmacy.

## 2018-01-12 ENCOUNTER — Other Ambulatory Visit: Payer: Self-pay

## 2018-01-12 DIAGNOSIS — Z760 Encounter for issue of repeat prescription: Secondary | ICD-10-CM

## 2018-01-12 MED ORDER — OMEPRAZOLE 20 MG PO CPDR: 20.0000 mg | DELAYED_RELEASE_CAPSULE | Freq: Every day | ORAL | 0 refills | Status: DC

## 2018-01-12 MED ORDER — ATENOLOL 25 MG PO TABS: 25.0000 mg | ORAL_TABLET | Freq: Every day | ORAL | 0 refills | Status: DC

## 2018-01-12 MED ORDER — AMLODIPINE BESYLATE 10 MG PO TABS: 10.0000 mg | ORAL_TABLET | Freq: Every day | ORAL | 0 refills | Status: DC

## 2018-01-12 MED ORDER — BENAZEPRIL-HYDROCHLOROTHIAZIDE 20-25 MG PO TABS: 1.0000 | ORAL_TABLET | Freq: Every day | ORAL | 0 refills | Status: DC

## 2018-01-16 ENCOUNTER — Telehealth: Payer: Self-pay

## 2018-01-16 ENCOUNTER — Ambulatory Visit (INDEPENDENT_AMBULATORY_CARE_PROVIDER_SITE_OTHER): Payer: 59 | Admitting: Women's Health

## 2018-01-16 ENCOUNTER — Encounter: Payer: Self-pay | Admitting: Women's Health

## 2018-01-16 VITALS — BP 138/82 | Wt 219.0 lb

## 2018-01-16 DIAGNOSIS — N760 Acute vaginitis: Secondary | ICD-10-CM

## 2018-01-16 DIAGNOSIS — B9689 Other specified bacterial agents as the cause of diseases classified elsewhere: Secondary | ICD-10-CM

## 2018-01-16 DIAGNOSIS — B3731 Acute candidiasis of vulva and vagina: Secondary | ICD-10-CM

## 2018-01-16 DIAGNOSIS — B373 Candidiasis of vulva and vagina: Secondary | ICD-10-CM

## 2018-01-16 LAB — WET PREP FOR TRICH, YEAST, CLUE

## 2018-01-16 MED ORDER — FLUCONAZOLE 150 MG PO TABS: 150.0000 mg | ORAL_TABLET | Freq: Once | ORAL | 0 refills | Status: AC

## 2018-01-16 MED ORDER — FLUCONAZOLE 100 MG PO TABS: ORAL_TABLET | ORAL | 0 refills | Status: DC

## 2018-01-16 NOTE — Telephone Encounter (Signed)
Saw [place] this morning.  Rx costed $58.00.  Too much for her to pay.  She said you told her to call her if more than $10.  Patient said since the test was negative could she maybe just get a Diflucan tablet.

## 2018-01-16 NOTE — Telephone Encounter (Signed)
Okay could try Diflucan 15o mg 1  dose her wet prep was negative but she was complaining of odor.  Have her call if no relief.

## 2018-01-16 NOTE — Patient Instructions (Addendum)
Health Maintenance, Female Adopting a healthy lifestyle and getting preventive care can go a long way to promote health and wellness. Talk with your health care provider about what schedule of regular examinations is right for you. This is a good chance for you to check in with your provider about disease prevention and staying healthy. In between checkups, there are plenty of things you can do on your own. Experts have done a lot of research about which lifestyle changes and preventive measures are most likely to keep you healthy. Ask your health care provider for more information. Weight and diet Eat a healthy diet  Be sure to include plenty of vegetables, fruits, low-fat dairy products, and lean protein.  Do not eat a lot of foods high in solid fats, added sugars, or salt.  Get regular exercise. This is one of the most important things you can do for your health. ? Most adults should exercise for at least 150 minutes each week. The exercise should increase your heart rate and make you sweat (moderate-intensity exercise). ? Most adults should also do strengthening exercises at least twice a week. This is in addition to the moderate-intensity exercise.  Maintain a healthy weight  Body mass index (BMI) is a measurement that can be used to identify possible weight problems. It estimates body fat based on height and weight. Your health care provider can help determine your BMI and help you achieve or maintain a healthy weight.  For females 20 years of age and older: ? A BMI below 18.5 is considered underweight. ? A BMI of 18.5 to 24.9 is normal. ? A BMI of 25 to 29.9 is considered overweight. ? A BMI of 30 and above is considered obese.  Watch levels of cholesterol and blood lipids  You should start having your blood tested for lipids and cholesterol at 39 years of age, then have this test every 5 years.  You may need to have your cholesterol levels checked more often if: ? Your lipid or  cholesterol levels are high. ? You are older than 39 years of age. ? You are at high risk for heart disease.  Cancer screening Lung Cancer  Lung cancer screening is recommended for adults 55-80 years old who are at high risk for lung cancer because of a history of smoking.  A yearly low-dose CT scan of the lungs is recommended for people who: ? Currently smoke. ? Have quit within the past 15 years. ? Have at least a 30-pack-year history of smoking. A pack year is smoking an average of one pack of cigarettes a day for 1 year.  Yearly screening should continue until it has been 15 years since you quit.  Yearly screening should stop if you develop a health problem that would prevent you from having lung cancer treatment.  Breast Cancer  Practice breast self-awareness. This means understanding how your breasts normally appear and feel.  It also means doing regular breast self-exams. Let your health care provider know about any changes, no matter how small.  If you are in your 20s or 30s, you should have a clinical breast exam (CBE) by a health care provider every 1-3 years as part of a regular health exam.  If you are 40 or older, have a CBE every year. Also consider having a breast X-ray (mammogram) every year.  If you have a family history of breast cancer, talk to your health care provider about genetic screening.  If you are at high risk   for breast cancer, talk to your health care provider about having an MRI and a mammogram every year.  Breast cancer gene (BRCA) assessment is recommended for women who have family members with BRCA-related cancers. BRCA-related cancers include: ? Breast. ? Ovarian. ? Tubal. ? Peritoneal cancers.  Results of the assessment will determine the need for genetic counseling and BRCA1 and BRCA2 testing.  Cervical Cancer Your health care provider may recommend that you be screened regularly for cancer of the pelvic organs (ovaries, uterus, and  vagina). This screening involves a pelvic examination, including checking for microscopic changes to the surface of your cervix (Pap test). You may be encouraged to have this screening done every 3 years, beginning at age 22.  For women ages 56-65, health care providers may recommend pelvic exams and Pap testing every 3 years, or they may recommend the Pap and pelvic exam, combined with testing for human papilloma virus (HPV), every 5 years. Some types of HPV increase your risk of cervical cancer. Testing for HPV may also be done on women of any age with unclear Pap test results.  Other health care providers may not recommend any screening for nonpregnant women who are considered low risk for pelvic cancer and who do not have symptoms. Ask your health care provider if a screening pelvic exam is right for you.  If you have had past treatment for cervical cancer or a condition that could lead to cancer, you need Pap tests and screening for cancer for at least 20 years after your treatment. If Pap tests have been discontinued, your risk factors (such as having a new sexual partner) need to be reassessed to determine if screening should resume. Some women have medical problems that increase the chance of getting cervical cancer. In these cases, your health care provider may recommend more frequent screening and Pap tests.  Colorectal Cancer  This type of cancer can be detected and often prevented.  Routine colorectal cancer screening usually begins at 39 years of age and continues through 39 years of age.  Your health care provider may recommend screening at an earlier age if you have risk factors for colon cancer.  Your health care provider may also recommend using home test kits to check for hidden blood in the stool.  A small camera at the end of a tube can be used to examine your colon directly (sigmoidoscopy or colonoscopy). This is done to check for the earliest forms of colorectal  cancer.  Routine screening usually begins at age 33.  Direct examination of the colon should be repeated every 5-10 years through 39 years of age. However, you may need to be screened more often if early forms of precancerous polyps or small growths are found.  Skin Cancer  Check your skin from head to toe regularly.  Tell your health care provider about any new moles or changes in moles, especially if there is a change in a mole's shape or color.  Also tell your health care provider if you have a mole that is larger than the size of a pencil eraser.  Always use sunscreen. Apply sunscreen liberally and repeatedly throughout the day.  Protect yourself by wearing long sleeves, pants, a wide-brimmed hat, and sunglasses whenever you are outside.  Heart disease, diabetes, and high blood pressure  High blood pressure causes heart disease and increases the risk of stroke. High blood pressure is more likely to develop in: ? People who have blood pressure in the high end of  the normal range (130-139/85-89 mm Hg). ? People who are overweight or obese. ? People who are African American.  If you are 21-29 years of age, have your blood pressure checked every 3-5 years. If you are 3 years of age or older, have your blood pressure checked every year. You should have your blood pressure measured twice-once when you are at a hospital or clinic, and once when you are not at a hospital or clinic. Record the average of the two measurements. To check your blood pressure when you are not at a hospital or clinic, you can use: ? An automated blood pressure machine at a pharmacy. ? A home blood pressure monitor.  If you are between 17 years and 37 years old, ask your health care provider if you should take aspirin to prevent strokes.  Have regular diabetes screenings. This involves taking a blood sample to check your fasting blood sugar level. ? If you are at a normal weight and have a low risk for diabetes,  have this test once every three years after 39 years of age. ? If you are overweight and have a high risk for diabetes, consider being tested at a younger age or more often. Preventing infection Hepatitis B  If you have a higher risk for hepatitis B, you should be screened for this virus. You are considered at high risk for hepatitis B if: ? You were born in a country where hepatitis B is common. Ask your health care provider which countries are considered high risk. ? Your parents were born in a high-risk country, and you have not been immunized against hepatitis B (hepatitis B vaccine). ? You have HIV or AIDS. ? You use needles to inject street drugs. ? You live with someone who has hepatitis B. ? You have had sex with someone who has hepatitis B. ? You get hemodialysis treatment. ? You take certain medicines for conditions, including cancer, organ transplantation, and autoimmune conditions.  Hepatitis C  Blood testing is recommended for: ? Everyone born from [birth year] through 1967. ? Anyone with known risk factors for hepatitis C.  Sexually transmitted infections (STIs)  You should be screened for sexually transmitted infections (STIs) including gonorrhea and chlamydia if: ? You are sexually active and are younger than 39 years of age. ? You are older than 39 years of age and your health care provider tells you that you are at risk for this type of infection. ? Your sexual activity has changed since you were last screened and you are at an increased risk for chlamydia or gonorrhea. Ask your health care provider if you are at risk.  If you do not have HIV, but are at risk, it may be recommended that you take a prescription medicine daily to prevent HIV infection. This is called pre-exposure prophylaxis (PrEP). You are considered at risk if: ? You are sexually active and do not regularly use condoms or know the HIV status of your partner(s). ? You take drugs by injection. ? You are  sexually active with a partner who has HIV.  Talk with your health care provider about whether you are at high risk of being infected with HIV. If you choose to begin PrEP, you should first be tested for HIV. You should then be tested every 3 months for as long as you are taking PrEP. Pregnancy  If you are premenopausal and you may become pregnant, ask your health care provider about preconception counseling.  If you may become  are sexually active and do not regularly use condoms or know the HIV status of your partner(s).  ? You take drugs by injection.  ? You  are sexually active with a partner who has HIV.    Talk with your health care provider about whether you are at high risk of being infected with HIV. If you choose to begin PrEP, you should first be tested for HIV. You should then be tested every 3 months for as long as you are taking PrEP.  Pregnancy   If you are premenopausal and you may become pregnant, ask your health care provider about preconception counseling.   If you may become pregnant, take 400 to 800 micrograms (mcg) of folic acid every day.   If you want to prevent pregnancy, talk to your health care provider about birth control (contraception).  Osteoporosis and menopause   Osteoporosis is a disease in which the bones lose minerals and strength with aging. This can result in serious bone fractures. Your risk for osteoporosis can be identified using a bone density scan.   If you are 65 years of age or older, or if you are at risk for osteoporosis and fractures, ask your health care provider if you should be screened.   Ask your health care provider whether you should take a calcium or vitamin D supplement to lower your risk for osteoporosis.   Menopause may have certain physical symptoms and risks.   Hormone replacement therapy may reduce some of these symptoms and risks.  Talk to your health care provider about whether hormone replacement therapy is right for you.  Follow these instructions at home:   Schedule regular health, dental, and eye exams.   Stay current with your immunizations.   Do not use any tobacco products including cigarettes, chewing tobacco, or electronic cigarettes.   If you are pregnant, do not drink alcohol.   If you are breastfeeding, limit how much and how often you drink alcohol.   Limit alcohol intake to no more than 1 drink per day for nonpregnant women. One drink equals 12 ounces of beer, 5 ounces of wine, or 1 ounces of hard liquor.   Do not use street drugs.   Do not share needles.   Ask your health care  provider for help if you need support or information about quitting drugs.   Tell your health care provider if you often feel depressed.   Tell your health care provider if you have ever been abused or do not feel safe at home.  This information is not intended to replace advice given to you by your health care provider. Make sure you discuss any questions you have with your health care provider.  Document Released: 12/20/2010 Document Revised: 11/12/2015 Document Reviewed: 03/10/2015  [employer]vier Interactive Patient Education  2018 [employer]    Vaginal Yeast infection, Adult  Vaginal yeast infection is a condition that causes soreness, swelling, and redness (inflammation) of the vagina. It also causes vaginal discharge. This is a common condition. Some women get this infection frequently.  What are the causes?  This condition is caused by a change in the normal balance of the yeast (candida) and bacteria that live in the vagina. This change causes an overgrowth of yeast, which causes the inflammation.  What increases the risk?  This condition is more likely to develop in:   Women who take antibiotic medicines.   Women who have diabetes.   Women who take birth control pills.     Women who are pregnant.   Women who douche often.   Women who have a weak defense (immune) system.   Women who have been taking steroid medicines for a long time.   Women who frequently wear tight clothing.    What are the signs or symptoms?  Symptoms of this condition include:   White, thick vaginal discharge.   Swelling, itching, redness, and irritation of the vagina. The lips of the vagina (vulva) may be affected as well.   Pain or a burning feeling while urinating.   Pain during sex.    How is this diagnosed?  This condition is diagnosed with a medical history and physical exam. This will include a pelvic exam. Your health care provider will examine a sample of your vaginal discharge under a microscope. Your health care  provider may send this sample for testing to confirm the diagnosis.  How is this treated?  This condition is treated with medicine. Medicines may be over-the-counter or prescription. You may be told to use one or more of the following:   Medicine that is taken orally.   Medicine that is applied as a cream.   Medicine that is inserted directly into the vagina (suppository).    Follow these instructions at home:   Take or apply over-the-counter and prescription medicines only as told by your health care provider.   Do not have sex until your health care provider has approved. Tell your sex partner that you have a yeast infection. That person should go to his or her health care provider if he or she develops symptoms.   Do not wear tight clothes, such as pantyhose or tight pants.   Avoid using tampons until your health care provider approves.   Eat more yogurt. This may help to keep your yeast infection from returning.   Try taking a sitz bath to help with discomfort. This is a warm water bath that is taken while you are sitting down. The water should only come up to your hips and should cover your buttocks. Do this 3-4 times per day or as told by your health care provider.   Do not douche.   Wear breathable, cotton underwear.   If you have diabetes, keep your blood sugar levels under control.  Contact a health care provider if:   You have a fever.   Your symptoms go away and then return.   Your symptoms do not get better with treatment.   Your symptoms get worse.   You have new symptoms.   You develop blisters in or around your vagina.   You have blood coming from your vagina and it is not your menstrual period.   You develop pain in your abdomen.  This information is not intended to replace advice given to you by your health care provider. Make sure you discuss any questions you have with your health care provider.  Document Released: 03/16/2005 Document Revised: 11/18/2015 Document Reviewed:  12/08/2014  [employer] Interactive Patient Education  2018 [employer]

## 2018-01-16 NOTE — Progress Notes (Signed)
37 year old S BF G2 P1 with complaint of white discharge on and off for the past month without irritation or odor.  Has mild vaginal itching only.  States has had BV in the past and does not think it is BV.  Same partner.  Monthly cycle with condoms.  Denies urinary symptoms, abdominal pain, fever, changes in GI elimination.  Medical problems include hypertension and asthma.  Exam: Appears well.  External genitalia within normal limits, speculum exam scant white discharge without odor or irritation noted, wet prep negative.  Bimanual no CMT or adnexal tenderness.  Normal exam  Plan: Reviewed normality of exam and wet prep, reassurance given questions answered.. Prescription for Diflucan 150 mg given if itching persists.  Aware of yeast prevention.

## 2018-01-21 ENCOUNTER — Other Ambulatory Visit: Payer: Self-pay | Admitting: Adult Health

## 2018-01-21 DIAGNOSIS — Z760 Encounter for issue of repeat prescription: Secondary | ICD-10-CM

## 2018-01-23 NOTE — Telephone Encounter (Signed)
Filled on 01/12/18 for 30 days. Pt needs cpx and fasting lab work.

## 2018-03-19 ENCOUNTER — Ambulatory Visit (INDEPENDENT_AMBULATORY_CARE_PROVIDER_SITE_OTHER): Payer: 59 | Admitting: Women's Health

## 2018-03-19 ENCOUNTER — Encounter: Payer: Self-pay | Admitting: Women's Health

## 2018-03-19 VITALS — BP 118/80 | Ht 64.0 in | Wt 216.0 lb

## 2018-03-19 DIAGNOSIS — Z01419 Encounter for gynecological examination (general) (routine) without abnormal findings: Secondary | ICD-10-CM

## 2018-03-19 NOTE — Patient Instructions (Addendum)
Health Maintenance, Female Adopting a healthy lifestyle and getting preventive care can go a long way to promote health and wellness. Talk with your health care provider about what schedule of regular examinations is right for you. This is a good chance for you to check in with your provider about disease prevention and staying healthy. In between checkups, there are plenty of things you can do on your own. Experts have done a lot of research about which lifestyle changes and preventive measures are most likely to keep you healthy. Ask your health care provider for more information. Weight and diet Eat a healthy diet  Be sure to include plenty of vegetables, fruits, low-fat dairy products, and lean protein.  Do not eat a lot of foods high in solid fats, added sugars, or salt.  Get regular exercise. This is one of the most important things you can do for your health. ? Most adults should exercise for at least 150 minutes each week. The exercise should increase your heart rate and make you sweat (moderate-intensity exercise). ? Most adults should also do strengthening exercises at least twice a week. This is in addition to the moderate-intensity exercise.  Maintain a healthy weight  Body mass index (BMI) is a measurement that can be used to identify possible weight problems. It estimates body fat based on height and weight. Your health care provider can help determine your BMI and help you achieve or maintain a healthy weight.  For females 39 years of age and older: ? A BMI below 18.5 is considered underweight. ? A BMI of 18.5 to 24.9 is normal. ? A BMI of 25 to 29.9 is considered overweight. ? A BMI of 30 and above is considered obese.  Watch levels of cholesterol and blood lipids  You should start having your blood tested for lipids and cholesterol at 39 years of age, then have this test every 5 years.  You may need to have your cholesterol levels checked more often if: ? Your lipid or  cholesterol levels are high. ? You are older than 39 years of age. ? You are at high risk for heart disease.  Cancer screening Lung Cancer  Lung cancer screening is recommended for adults 39-30 years old who are at high risk for lung cancer because of a history of smoking.  A yearly low-dose CT scan of the lungs is recommended for people who: ? Currently smoke. ? Have quit within the past 15 years. ? Have at least a 30-pack-year history of smoking. A pack year is smoking an average of one pack of cigarettes a day for 1 year.  Yearly screening should continue until it has been 15 years since you quit.  Yearly screening should stop if you develop a health problem that would prevent you from having lung cancer treatment.  Breast Cancer  Practice breast self-awareness. This means understanding how your breasts normally appear and feel.  It also means doing regular breast self-exams. Let your health care provider know about any changes, no matter how small.  If you are in your 20s or 30s, you should have a clinical breast exam (CBE) by a health care provider every 1-3 years as part of a regular health exam.  If you are 58 or older, have a CBE every year. Also consider having a breast X-ray (mammogram) every year.  If you have a family history of breast cancer, talk to your health care provider about genetic screening.  If you are at high risk  for breast cancer, talk to your health care provider about having an MRI and a mammogram every year.  Breast cancer gene (BRCA) assessment is recommended for women who have family members with BRCA-related cancers. BRCA-related cancers include: ? Breast. ? Ovarian. ? Tubal. ? Peritoneal cancers.  Results of the assessment will determine the need for genetic counseling and BRCA1 and BRCA2 testing.  Cervical Cancer Your health care provider may recommend that you be screened regularly for cancer of the pelvic organs (ovaries, uterus, and  vagina). This screening involves a pelvic examination, including checking for microscopic changes to the surface of your cervix (Pap test). You may be encouraged to have this screening done every 3 years, beginning at age 22.  For women ages 56-65, health care providers may recommend pelvic exams and Pap testing every 3 years, or they may recommend the Pap and pelvic exam, combined with testing for human papilloma virus (HPV), every 5 years. Some types of HPV increase your risk of cervical cancer. Testing for HPV may also be done on women of any age with unclear Pap test results.  Other health care providers may not recommend any screening for nonpregnant women who are considered low risk for pelvic cancer and who do not have symptoms. Ask your health care provider if a screening pelvic exam is right for you.  If you have had past treatment for cervical cancer or a condition that could lead to cancer, you need Pap tests and screening for cancer for at least 20 years after your treatment. If Pap tests have been discontinued, your risk factors (such as having a new sexual partner) need to be reassessed to determine if screening should resume. Some women have medical problems that increase the chance of getting cervical cancer. In these cases, your health care provider may recommend more frequent screening and Pap tests.  Colorectal Cancer  This type of cancer can be detected and often prevented.  Routine colorectal cancer screening usually begins at 39 years of age and continues through 39 years of age.  Your health care provider may recommend screening at an earlier age if you have risk factors for colon cancer.  Your health care provider may also recommend using home test kits to check for hidden blood in the stool.  A small camera at the end of a tube can be used to examine your colon directly (sigmoidoscopy or colonoscopy). This is done to check for the earliest forms of colorectal  cancer.  Routine screening usually begins at age 33.  Direct examination of the colon should be repeated every 5-10 years through 39 years of age. However, you may need to be screened more often if early forms of precancerous polyps or small growths are found.  Skin Cancer  Check your skin from head to toe regularly.  Tell your health care provider about any new moles or changes in moles, especially if there is a change in a mole's shape or color.  Also tell your health care provider if you have a mole that is larger than the size of a pencil eraser.  Always use sunscreen. Apply sunscreen liberally and repeatedly throughout the day.  Protect yourself by wearing long sleeves, pants, a wide-brimmed hat, and sunglasses whenever you are outside.  Heart disease, diabetes, and high blood pressure  High blood pressure causes heart disease and increases the risk of stroke. High blood pressure is more likely to develop in: ? People who have blood pressure in the high end of  the normal range (130-139/85-89 mm Hg). ? People who are overweight or obese. ? People who are African American.  If you are 21-29 years of age, have your blood pressure checked every 3-5 years. If you are 3 years of age or older, have your blood pressure checked every year. You should have your blood pressure measured twice-once when you are at a hospital or clinic, and once when you are not at a hospital or clinic. Record the average of the two measurements. To check your blood pressure when you are not at a hospital or clinic, you can use: ? An automated blood pressure machine at a pharmacy. ? A home blood pressure monitor.  If you are between 17 years and 37 years old, ask your health care provider if you should take aspirin to prevent strokes.  Have regular diabetes screenings. This involves taking a blood sample to check your fasting blood sugar level. ? If you are at a normal weight and have a low risk for diabetes,  have this test once every three years after 39 years of age. ? If you are overweight and have a high risk for diabetes, consider being tested at a younger age or more often. Preventing infection Hepatitis B  If you have a higher risk for hepatitis B, you should be screened for this virus. You are considered at high risk for hepatitis B if: ? You were born in a country where hepatitis B is common. Ask your health care provider which countries are considered high risk. ? Your parents were born in a high-risk country, and you have not been immunized against hepatitis B (hepatitis B vaccine). ? You have HIV or AIDS. ? You use needles to inject street drugs. ? You live with someone who has hepatitis B. ? You have had sex with someone who has hepatitis B. ? You get hemodialysis treatment. ? You take certain medicines for conditions, including cancer, organ transplantation, and autoimmune conditions.  Hepatitis C  Blood testing is recommended for: ? Everyone born from [birth year] through 1967. ? Anyone with known risk factors for hepatitis C.  Sexually transmitted infections (STIs)  You should be screened for sexually transmitted infections (STIs) including gonorrhea and chlamydia if: ? You are sexually active and are younger than 39 years of age. ? You are older than 39 years of age and your health care provider tells you that you are at risk for this type of infection. ? Your sexual activity has changed since you were last screened and you are at an increased risk for chlamydia or gonorrhea. Ask your health care provider if you are at risk.  If you do not have HIV, but are at risk, it may be recommended that you take a prescription medicine daily to prevent HIV infection. This is called pre-exposure prophylaxis (PrEP). You are considered at risk if: ? You are sexually active and do not regularly use condoms or know the HIV status of your partner(s). ? You take drugs by injection. ? You are  sexually active with a partner who has HIV.  Talk with your health care provider about whether you are at high risk of being infected with HIV. If you choose to begin PrEP, you should first be tested for HIV. You should then be tested every 3 months for as long as you are taking PrEP. Pregnancy  If you are premenopausal and you may become pregnant, ask your health care provider about preconception counseling.  If you may become  pregnant, take 400 to 800 micrograms (mcg) of folic acid every day.  If you want to prevent pregnancy, talk to your health care provider about birth control (contraception). Osteoporosis and menopause  Osteoporosis is a disease in which the bones lose minerals and strength with aging. This can result in serious bone fractures. Your risk for osteoporosis can be identified using a bone density scan.  If you are 65 years of age or older, or if you are at risk for osteoporosis and fractures, ask your health care provider if you should be screened.  Ask your health care provider whether you should take a calcium or vitamin D supplement to lower your risk for osteoporosis.  Menopause may have certain physical symptoms and risks.  Hormone replacement therapy may reduce some of these symptoms and risks. Talk to your health care provider about whether hormone replacement therapy is right for you. Follow these instructions at home:  Schedule regular health, dental, and eye exams.  Stay current with your immunizations.  Do not use any tobacco products including cigarettes, chewing tobacco, or electronic cigarettes.  If you are pregnant, do not drink alcohol.  If you are breastfeeding, limit how much and how often you drink alcohol.  Limit alcohol intake to no more than 1 drink per day for nonpregnant women. One drink equals 12 ounces of beer, 5 ounces of wine, or 1 ounces of hard liquor.  Do not use street drugs.  Do not share needles.  Ask your health care  provider for help if you need support or information about quitting drugs.  Tell your health care provider if you often feel depressed.  Tell your health care provider if you have ever been abused or do not feel safe at home. This information is not intended to replace advice given to you by your health care provider. Make sure you discuss any questions you have with your health care provider. Document Released: 12/20/2010 Document Revised: 11/12/2015 Document Reviewed: 03/10/2015 Elsevier Interactive Patient Education  2018 [employer] Levonorgestrel intrauterine device (IUD) What is this medicine? LEVONORGESTREL IUD (LEE voe nor jes trel) is a contraceptive (birth control) device. The device is placed inside the uterus by a healthcare professional. It is used to prevent pregnancy. This device can also be used to treat heavy bleeding that occurs during your period. This medicine may be used for other purposes; ask your health care provider or pharmacist if you have questions. COMMON BRAND NAME(S): Kyleena, LILETTA, Mirena, Skyla What should I tell my health care provider before I take this medicine? They need to know if you have any of these conditions: -abnormal Pap smear -cancer of the breast, uterus, or cervix -diabetes -endometritis -genital or pelvic infection now or in the past -have more than one sexual partner or your partner has more than one partner -heart disease -history of an ectopic or tubal pregnancy -immune system problems -IUD in place -liver disease or tumor -problems with blood clots or take blood-thinners -seizures -use intravenous drugs -uterus of unusual shape -vaginal bleeding that has not been explained -an unusual or allergic reaction to levonorgestrel, other hormones, silicone, or polyethylene, medicines, foods, dyes, or preservatives -pregnant or trying to get pregnant -breast-feeding How should I use this medicine? This device is placed inside the  uterus by a health care professional. Talk to your pediatrician regarding the use of this medicine in children. Special care may be needed. Overdosage: If you think you have taken too much of this medicine contact a   poison control center or emergency room at once. NOTE: This medicine is only for you. Do not share this medicine with others. What if I miss a dose? This does not apply. Depending on the brand of device you have inserted, the device will need to be replaced every 3 to 5 years if you wish to continue using this type of birth control. What may interact with this medicine? Do not take this medicine with any of the following medications: -amprenavir -bosentan -fosamprenavir This medicine may also interact with the following medications: -aprepitant -armodafinil -barbiturate medicines for inducing sleep or treating seizures -bexarotene -boceprevir -griseofulvin -medicines to treat seizures like carbamazepine, ethotoin, felbamate, oxcarbazepine, phenytoin, topiramate -modafinil -pioglitazone -rifabutin -rifampin -rifapentine -some medicines to treat HIV infection like atazanavir, efavirenz, indinavir, lopinavir, nelfinavir, tipranavir, ritonavir -St. John's wort -warfarin This list may not describe all possible interactions. Give your health care provider a list of all the medicines, herbs, non-prescription drugs, or dietary supplements you use. Also tell them if you smoke, drink alcohol, or use illegal drugs. Some items may interact with your medicine. What should I watch for while using this medicine? Visit your doctor or health care professional for regular check ups. See your doctor if you or your partner has sexual contact with others, becomes HIV positive, or gets a sexual transmitted disease. This product does not protect you against HIV infection (AIDS) or other sexually transmitted diseases. You can check the placement of the IUD yourself by reaching up to the top of  your vagina with clean fingers to feel the threads. Do not pull on the threads. It is a good habit to check placement after each menstrual period. Call your doctor right away if you feel more of the IUD than just the threads or if you cannot feel the threads at all. The IUD may come out by itself. You may become pregnant if the device comes out. If you notice that the IUD has come out use a backup birth control method like condoms and call your health care provider. Using tampons will not change the position of the IUD and are okay to use during your period. This IUD can be safely scanned with magnetic resonance imaging (MRI) only under specific conditions. Before you have an MRI, tell your healthcare provider that you have an IUD in place, and which type of IUD you have in place. What side effects may I notice from receiving this medicine? Side effects that you should report to your doctor or health care professional as soon as possible: -allergic reactions like skin rash, itching or hives, swelling of the face, lips, or tongue -fever, flu-like symptoms -genital sores -high blood pressure -no menstrual period for 6 weeks during use -pain, swelling, warmth in the leg -pelvic pain or tenderness -severe or sudden headache -signs of pregnancy -stomach cramping -sudden shortness of breath -trouble with balance, talking, or walking -unusual vaginal bleeding, discharge -yellowing of the eyes or skin Side effects that usually do not require medical attention (report to your doctor or health care professional if they continue or are bothersome): -acne -breast pain -change in sex drive or performance -changes in weight -cramping, dizziness, or faintness while the device is being inserted -headache -irregular menstrual bleeding within first 3 to 6 months of use -nausea This list may not describe all possible side effects. Call your doctor for medical advice about side effects. You may report side  effects to FDA at 1-800-FDA-1088. Where should I keep my medicine? This does  not apply. NOTE: This sheet is a summary. It may not cover all possible information. If you have questions about this medicine, talk to your doctor, pharmacist, or health care provider.  2018 Elsevier/Gold Standard (2016-03-18 14:14:56)

## 2018-03-19 NOTE — Progress Notes (Signed)
Kelsey Cameron Apr 16, 1981 [id number]    History:    Presents for annual exam.  Monthly cycle/condoms/same partner.  Normal Pap history.  Mother breast cancer age 39 survivor.  Hypertension primary care manages.  Past medical history, past surgical history, family history and social history were all reviewed and documented in the EPIC chart.  Works for an [employer].  Daughter 41 doing well.  ROS:  A ROS was performed and pertinent positives and negatives are included.  Exam:  Vitals:   03/19/18 0837  BP: 118/80  Weight: 216 lb (98 kg)  Height: 5\' 4"  (1.626 m)   Body mass index is 37.08 kg/m.   General appearance:  Normal Thyroid:  Symmetrical, normal in size, without palpable masses or nodularity. Respiratory  Auscultation:  Clear without wheezing or rhonchi Cardiovascular  Auscultation:  Regular rate, without rubs, murmurs or gallops  Edema/varicosities:  Not grossly evident Abdominal  Soft,nontender, without masses, guarding or rebound.  Liver/spleen:  No organomegaly noted  Hernia:  None appreciated  Skin  Inspection:  Grossly normal   Breasts: Examined lying and sitting. Bilateral breast reduction    Right: Without masses, retractions, discharge or axillary adenopathy.     Left: Without masses, retractions, discharge or axillary adenopathy. Gentitourinary   Inguinal/mons:  Normal without inguinal adenopathy  External genitalia:  Normal  BUS/Urethra/Skene's glands:  Normal  Vagina:  Normal  Cervix:  Normal  Uterus:  normal in size, shape and contour.  Midline and mobile  Adnexa/parametria:     Rt: Without masses or tenderness.   Lt: Without masses or tenderness.  Anus and perineum: Normal  Digital rectal exam: Normal sphincter tone without palpated masses or tenderness  Assessment/Plan:  37 y.o. SBF G2, P1 for annual exam with foot pain after long walks..   Monthly cycle/condoms contraception management Hypertension-primary care manages labs and  meds Obesity  Plan: SBE's, annual screening mammogram at 40, calcium rich foods, vitamin D 1000 daily encouraged.  Reviewed importance of increasing exercise and decreasing calorie/carbs.  Encouraged to walk shorter amount of times more frequently.  Mirena IUD information given and reviewed, Dr. Phineas Real would place with menstrual cycle, slight risk for infection, perforation and hemorrhage reviewed.  If decides would like to proceed will call to check coverage and schedule.  Pap normal 2018, new screening guidelines reviewed.    [place], 8:47 AM 03/19/2018

## 2018-03-21 LAB — URINALYSIS, COMPLETE W/RFL CULTURE
BILIRUBIN URINE: NEGATIVE
Bacteria, UA: NONE SEEN /HPF
GLUCOSE, UA: NEGATIVE
Hyaline Cast: NONE SEEN /LPF
KETONES UR: NEGATIVE
LEUKOCYTE ESTERASE: NEGATIVE
NITRITES URINE, INITIAL: NEGATIVE
PH: 6.5 (ref 5.0–8.0)
PROTEIN: NEGATIVE
Specific Gravity, Urine: 1.023 (ref 1.001–1.03)
WBC UA: NONE SEEN /HPF (ref 0–5)

## 2018-03-21 LAB — URINE CULTURE
MICRO NUMBER: 91177209
SPECIMEN QUALITY:: ADEQUATE

## 2018-03-21 LAB — CULTURE INDICATED

## 2018-03-28 ENCOUNTER — Ambulatory Visit (INDEPENDENT_AMBULATORY_CARE_PROVIDER_SITE_OTHER): Payer: 59 | Admitting: Adult Health

## 2018-03-28 ENCOUNTER — Encounter: Payer: Self-pay | Admitting: Adult Health

## 2018-03-28 VITALS — BP 122/88 | HR 73 | Temp 98.4°F | Wt 217.9 lb

## 2018-03-28 DIAGNOSIS — J069 Acute upper respiratory infection, unspecified: Secondary | ICD-10-CM | POA: Diagnosis not present

## 2018-03-28 MED ORDER — HYDROCODONE-HOMATROPINE 5-1.5 MG/5ML PO SYRP: 5.0000 mL | ORAL_SOLUTION | Freq: Three times a day (TID) | ORAL | 0 refills | Status: DC | PRN

## 2018-03-28 MED ORDER — PREDNISONE 10 MG PO TABS: ORAL_TABLET | ORAL | 0 refills | Status: DC

## 2018-03-28 MED ORDER — AZITHROMYCIN 250 MG PO TABS
ORAL_TABLET | ORAL | 0 refills | Status: DC
Start: 2018-03-28 — End: 2018-10-23

## 2018-03-28 NOTE — Progress Notes (Signed)
Subjective:    Patient ID: Kelsey Cameron, female    DOB: [birth year], 37 y.o.   MRN: [id number]  URI   This is a recurrent problem. The current episode started 1 to 4 weeks ago (3 weeks ). The maximum temperature recorded prior to her arrival was 100.4 - 100.9 F. Associated symptoms include congestion, coughing, ear pain, headaches, a plugged ear sensation, sinus pain and wheezing. Pertinent negatives include no nausea, rhinorrhea, sore throat or vomiting. She has tried nothing for the symptoms. The treatment provided mild relief.   37 year old female who  has a past medical history of Hypertension, Macromastia, PONV (postoperative nausea and vomiting), and STD (sexually transmitted disease) (2002).     Review of Systems  Constitutional: Positive for activity change, appetite change, fatigue and fever.  HENT: Positive for congestion, ear pain, sinus pressure and sinus pain. Negative for rhinorrhea and sore throat.   Respiratory: Positive for cough, chest tightness, shortness of breath and wheezing.   Cardiovascular: Negative.   Gastrointestinal: Negative for nausea and vomiting.  Neurological: Positive for headaches.    Past Medical History:  Diagnosis Date  . Hypertension   . Macromastia   . PONV (postoperative nausea and vomiting)   . STD (sexually transmitted disease) 2002   [place]    Social History   Socioeconomic History  . Marital status: Single    Spouse name: Not on file  . Number of children: Not on file  . Years of education: Not on file  . Highest education level: Not on file  Occupational History  . Not on file  Social Needs  . Financial resource strain: Not on file  . Food insecurity:    Worry: Not on file    Inability: Not on file  . Transportation needs:    Medical: Not on file    Non-medical: Not on file  Tobacco Use  . Smoking status: Former Smoker    Types: Cigarettes  . Smokeless tobacco: Never Used  . Tobacco comment: quit smoking 10/2010   Substance and Sexual Activity  . Alcohol use: No    Alcohol/week: 0.0 standard drinks    Frequency: Never  . Drug use: No  . Sexual activity: Yes    Birth control/protection: Condom    Comment: INTERCOUSRE AGE 12,SEXUAL PARTNERS MORE THAN 5   Lifestyle  . Physical activity:    Days per week: Not on file    Minutes per session: Not on file  . Stress: Not on file  Relationships  . Social connections:    Talks on phone: Not on file    Gets together: Not on file    Attends religious service: Not on file    Active member of club or organization: Not on file    Attends meetings of clubs or organizations: Not on file    Relationship status: Not on file  . Intimate partner violence:    Fear of current or ex partner: Not on file    Emotionally abused: Not on file    Physically abused: Not on file    Forced sexual activity: Not on file  Other Topics Concern  . Not on file  Social History Narrative   Works at [employer]   Has one child    Single       She likes to travel, work outside in her yard.     Past Surgical History:  Procedure Laterality Date  . BREAST REDUCTION SURGERY  05/17/2011   Procedure:  MAMMARY REDUCTION BILATERAL (BREAST);  Surgeon: Mary A Contogiannis;  Location: [facility];  Service: Plastics;  Laterality: Bilateral;  . CESAREAN SECTION  09/25/2006  . CHOLECYSTECTOMY  2009    Family History  Problem Relation Age of Onset  . Aneurysm Mother   . Hypertension Mother   . Breast cancer Mother 16  . Aneurysm Father   . Hypertension Father   . Heart disease Father   . Breast cancer Maternal Aunt   . Breast cancer Maternal Grandmother     Allergies  Allergen Reactions  . Amoxicillin Anaphylaxis and Hives  . Penicillins Hives and Itching  . Doxycycline     Current Outpatient Medications on File Prior to Visit  Medication Sig Dispense Refill  . amLODipine (NORVASC) 10 MG tablet Take 1 tablet (10 mg total) by mouth daily. Pt. Needs an appointment  for further refills. 30 tablet 0  . atenolol (TENORMIN) 25 MG tablet Take 1 tablet (25 mg total) by mouth daily. 30 tablet 0  . benazepril-hydrochlorthiazide (LOTENSIN HCT) 20-25 MG tablet Take 1 tablet by mouth daily. 30 tablet 0  . Multiple Vitamin (MULTIVITAMIN) capsule Take 1 capsule by mouth daily. Reported on 07/09/2015    . NONFORMULARY OR COMPOUNDED ITEM Boric acid gel caps 600 mg insert one tablet vaginally twice weekly 30 each 0  . omeprazole (PRILOSEC) 20 MG capsule Take 1 capsule (20 mg total) by mouth daily. 30 capsule 0  . albuterol (PROVENTIL HFA;VENTOLIN HFA) 108 (90 Base) MCG/ACT inhaler Inhale 2 puffs into the lungs every 6 (six) hours as needed for wheezing or shortness of breath. 1 Inhaler 0   No current facility-administered medications on file prior to visit.     BP 122/88 (BP Location: Left Arm, Patient Position: Sitting, Cuff Size: Normal)   Pulse 73   Temp 98.4 F (36.9 C) (Oral)   Wt 217 lb 14.4 oz (98.8 kg)   SpO2 98%   BMI 37.40 kg/m       Objective:   Physical Exam  Constitutional: She is oriented to person, place, and time. She appears well-developed and well-nourished. No distress.  HENT:  Head: Normocephalic and atraumatic.  Right Ear: External ear normal.  Left Ear: External ear normal.  Nose: Nose normal.  Mouth/Throat: Uvula is midline, oropharynx is clear and moist and mucous membranes are normal. No oropharyngeal exudate.  Eyes: Pupils are equal, round, and reactive to light. Conjunctivae and EOM are normal. Right eye exhibits no discharge. Left eye exhibits no discharge. No scleral icterus.  Cardiovascular: Normal rate, regular rhythm, normal heart sounds and intact distal pulses.  Pulmonary/Chest: Effort normal. She has wheezes. She has rales.  Abdominal: Soft. Normal appearance and bowel sounds are normal. She exhibits no distension and no mass. There is no tenderness. There is no rebound and no guarding. No hernia.  Neurological: She is alert  and oriented to person, place, and time.  Skin: Skin is warm and dry. She is not diaphoretic.  Psychiatric: She has a normal mood and affect. Her behavior is normal. Judgment and thought content normal.  Nursing note and vitals reviewed.     Assessment & Plan:  1. Upper respiratory tract infection, unspecified type  - azithromycin (ZITHROMAX Z-PAK) 250 MG tablet; Take 2 tablets on Day 1.  Then take 1 tablet daily.  Dispense: 6 tablet; Refill: 0 - predniSONE (DELTASONE) 10 MG tablet; 40 mg x 3 days, 20 mg x 3 days, 10 mg x 3 days  Dispense: 21  tablet; Refill: 0 - HYDROcodone-homatropine (HYCODAN) 5-1.5 MG/5ML syrup; Take 5 mLs by mouth every 8 (eight) hours as needed for cough.  Dispense: 120 mL; Refill: 0 - Follow up in 2-3 days if no improvement   [name], NP

## 2018-03-30 ENCOUNTER — Telehealth: Payer: Self-pay | Admitting: *Deleted

## 2018-03-30 NOTE — Telephone Encounter (Signed)
(  patient aware you are out of the office)Patient was seen on 03/19/18 stated you told her Rx for Diflucan tablets would be sent to pharmacy, not having any itching, states mentioned very small amount of discharge on exam, patient said she is taking antibiotic and always has yeast. Please advise

## 2018-04-01 NOTE — Telephone Encounter (Signed)
Ok for diflucan 150, office visit if no reliefw

## 2018-04-02 MED ORDER — FLUCONAZOLE 150 MG PO TABS: 150.0000 mg | ORAL_TABLET | Freq: Once | ORAL | 0 refills | Status: AC

## 2018-04-02 NOTE — Telephone Encounter (Signed)
Rx sent to pharmacy   

## 2018-04-27 ENCOUNTER — Encounter: Payer: Self-pay | Admitting: Adult Health

## 2018-04-27 ENCOUNTER — Ambulatory Visit (INDEPENDENT_AMBULATORY_CARE_PROVIDER_SITE_OTHER): Payer: 59 | Admitting: Adult Health

## 2018-04-27 VITALS — BP 150/86 | HR 98 | Temp 99.9°F | Wt 221.0 lb

## 2018-04-27 DIAGNOSIS — J0140 Acute pansinusitis, unspecified: Secondary | ICD-10-CM | POA: Diagnosis not present

## 2018-04-27 MED ORDER — IPRATROPIUM BROMIDE 0.03 % NA SOLN: 2.0000 | Freq: Two times a day (BID) | NASAL | 12 refills | Status: DC

## 2018-04-27 MED ORDER — AZITHROMYCIN 250 MG PO TABS: ORAL_TABLET | ORAL | 0 refills | Status: DC

## 2018-04-27 NOTE — Progress Notes (Signed)
   Subjective:    Patient ID: Kelsey Cameron, female    DOB: 26-Apr-1981, 37 y.o.   MRN: [id number]  She never took the abx that was prescribed one month ago as she did not know it was prescribed and the pharmacy never told her it was available.   Sinusitis  This is a recurrent problem. The current episode started 1 to 4 weeks ago. The problem has been gradually worsening since onset. There has been no fever. Associated symptoms include congestion, coughing (productive ), headaches and sinus pressure. Pertinent negatives include no chills, ear pain, shortness of breath or sore throat. Past treatments include nothing.      Review of Systems  Constitutional: Positive for activity change and fatigue. Negative for chills and fever.  HENT: Positive for congestion, postnasal drip, rhinorrhea, sinus pressure and sinus pain. Negative for ear pain and sore throat.   Eyes: Negative.   Respiratory: Positive for cough (productive ). Negative for chest tightness, shortness of breath and wheezing.   Cardiovascular: Negative.   Gastrointestinal: Negative.   Musculoskeletal: Negative.   Neurological: Positive for headaches.       Objective:   Physical Exam  Constitutional: She is oriented to person, place, and time. She appears well-developed and well-nourished. No distress.  HENT:  Head: Normocephalic and atraumatic.  Nose: Mucosal edema and rhinorrhea present. Right sinus exhibits maxillary sinus tenderness and frontal sinus tenderness. Left sinus exhibits frontal sinus tenderness.  Mouth/Throat: Uvula is midline and mucous membranes are normal. Oropharyngeal exudate present.  Cardiovascular: Normal rate, regular rhythm, normal heart sounds and intact distal pulses.  Pulmonary/Chest: Effort normal and breath sounds normal.  Neurological: She is alert and oriented to person, place, and time.  Skin: She is not diaphoretic.  Nursing note and vitals reviewed.     Assessment & Plan:  1. Acute  non-recurrent pansinusitis - ipratropium (ATROVENT) 0.03 % nasal spray; Place 2 sprays into both nostrils every 12 (twelve) hours.  Dispense: 30 mL; Refill: 12 - azithromycin (ZITHROMAX Z-PAK) 250 MG tablet; Take 2 tablets on Day 1.  Then take 1 tablet daily.  Dispense: 6 tablet; Refill: 0  - She has been seen by ENT in the past and they could not find a reason for her chronic Sinusitis. She was advised to make an appointment with allergy and asthma   [name], NP

## 2018-09-06 ENCOUNTER — Other Ambulatory Visit: Payer: Self-pay | Admitting: Adult Health

## 2018-09-06 DIAGNOSIS — Z760 Encounter for issue of repeat prescription: Secondary | ICD-10-CM

## 2018-09-07 ENCOUNTER — Telehealth: Payer: Self-pay | Admitting: Adult Health

## 2018-09-07 DIAGNOSIS — Z760 Encounter for issue of repeat prescription: Secondary | ICD-10-CM

## 2018-09-07 NOTE — Telephone Encounter (Signed)
Copied from [place] [id number]. Topic: Quick Communication - Rx Refill/Question >> Sep 07, 2018  3:22 PM Leward Quan A wrote: Medication: benazepril-hydrochlorthiazide (LOTENSIN HCT) 20-25 MG tablet, atenolol (TENORMIN) 25 MG tablet  Patient is a new enpleyee at [facility] but insurance does not kick in until 09/19/2018 but she is all out of medication. Asking for enough to get her to an appointment  Has the patient contacted their pharmacy? Yes.   (Agent: If no, request that the patient contact the pharmacy for the refill.) (Agent: If yes, when and what did the pharmacy advise?)  Preferred Pharmacy (with phone number or street name): [id number] - [place], [place] [phone] (Phone) [phone] (Fax)    Agent: Please be advised that RX refills may take up to 3 business days. We ask that you follow-up with your pharmacy.

## 2018-09-11 MED ORDER — ATENOLOL 25 MG PO TABS: 25.0000 mg | ORAL_TABLET | Freq: Every day | ORAL | 0 refills | Status: DC

## 2018-09-11 MED ORDER — BENAZEPRIL-HYDROCHLOROTHIAZIDE 20-25 MG PO TABS: 1.0000 | ORAL_TABLET | Freq: Every day | ORAL | 0 refills | Status: DC

## 2018-09-11 NOTE — Telephone Encounter (Signed)
Sent to the pharmacy by e-scribe. 

## 2018-09-11 NOTE — Telephone Encounter (Signed)
Ok for 90 days  

## 2018-10-23 ENCOUNTER — Ambulatory Visit: Payer: Self-pay | Admitting: Adult Health

## 2018-10-23 ENCOUNTER — Ambulatory Visit (INDEPENDENT_AMBULATORY_CARE_PROVIDER_SITE_OTHER): Payer: Self-pay | Admitting: Adult Health

## 2018-10-23 ENCOUNTER — Other Ambulatory Visit: Payer: Self-pay

## 2018-10-23 ENCOUNTER — Encounter: Payer: Self-pay | Admitting: Adult Health

## 2018-10-23 DIAGNOSIS — I10 Essential (primary) hypertension: Secondary | ICD-10-CM

## 2018-10-23 NOTE — Progress Notes (Signed)
Virtual Visit via Video Note  I connected with Kelsey Cameron on 10/23/18 at  2:30 PM EDT by a video enabled telemedicine application and verified that I am speaking with the correct person using two identifiers.  Location patient: home Location provider:work or home office Persons participating in the virtual visit: patient, provider  I discussed the limitations of evaluation and management by telemedicine and the availability of in person appointments. The patient expressed understanding and agreed to proceed.   HPI: 38 year old female who is being evaluated today for follow-up regarding hypertension.  She is currently prescribed Norvasc 10 mg,atenolol 25 mg, and benazepril 20/25 mg.  She had not been taking Norvasc, but found it in her cabinet and restarted this medication.  Norvasc was filled in July 2019 for 30 days.  She reports that Norvasc has made her fatigued since restarting this medication.   She is not monitoring her blood pressure at home denies headaches or blurred vision  BP Readings from Last 3 Encounters:  04/27/18 (!) 150/86  03/28/18 122/88  03/19/18 118/80    ROS: See pertinent positives and negatives per HPI.  Past Medical History:  Diagnosis Date  . Hypertension   . Macromastia   . PONV (postoperative nausea and vomiting)   . STD (sexually transmitted disease) 2002   [place]    Past Surgical History:  Procedure Laterality Date  . BREAST REDUCTION SURGERY  05/17/2011   Procedure: MAMMARY REDUCTION BILATERAL (BREAST);  Surgeon: Mary A Contogiannis;  Location: [facility];  Service: Plastics;  Laterality: Bilateral;  . CESAREAN SECTION  09/25/2006  . CHOLECYSTECTOMY  2009    Family History  Problem Relation Age of Onset  . Aneurysm Mother   . Hypertension Mother   . Breast cancer Mother 61  . Aneurysm Father   . Hypertension Father   . Heart disease Father   . Breast cancer Maternal Aunt   . Breast cancer Maternal Grandmother         Current Outpatient Medications:  .  albuterol (PROVENTIL HFA;VENTOLIN HFA) 108 (90 Base) MCG/ACT inhaler, Inhale 2 puffs into the lungs every 6 (six) hours as needed for wheezing or shortness of breath., Disp: 1 Inhaler, Rfl: 0 .  amLODipine (NORVASC) 10 MG tablet, Take 1 tablet (10 mg total) by mouth daily. Pt. Needs an appointment for further refills., Disp: 30 tablet, Rfl: 0 .  atenolol (TENORMIN) 25 MG tablet, Take 1 tablet (25 mg total) by mouth daily., Disp: 90 tablet, Rfl: 0 .  benazepril-hydrochlorthiazide (LOTENSIN HCT) 20-25 MG tablet, Take 1 tablet by mouth daily., Disp: 90 tablet, Rfl: 0 .  ipratropium (ATROVENT) 0.03 % nasal spray, Place 2 sprays into both nostrils every 12 (twelve) hours., Disp: 30 mL, Rfl: 12 .  Multiple Vitamin (MULTIVITAMIN) capsule, Take 1 capsule by mouth daily. Reported on 07/09/2015, Disp: , Rfl:  .  NONFORMULARY OR COMPOUNDED ITEM, Boric acid gel caps 600 mg insert one tablet vaginally twice weekly, Disp: 30 each, Rfl: 0 .  omeprazole (PRILOSEC) 20 MG capsule, Take 1 capsule (20 mg total) by mouth daily., Disp: 30 capsule, Rfl: 0  EXAM:  VITALS per patient if applicable:  GENERAL: alert, oriented, appears well and in no acute distress  HEENT: atraumatic, conjunttiva clear, no obvious abnormalities on inspection of external nose and ears  NECK: normal movements of the head and neck  LUNGS: on inspection no signs of respiratory distress, breathing rate appears normal, no obvious gross SOB, gasping or wheezing  CV: no obvious  cyanosis  MS: moves all visible extremities without noticeable abnormality  PSYCH/NEURO: pleasant and cooperative, no obvious depression or anxiety, speech and thought processing grossly intact  ASSESSMENT AND PLAN:  Discussed the following assessment and plan:  Essential hypertension  -I am unsure if she actually needs Norvasc at this point.  I am going to have her monitor her blood pressure at home over the next  week or 2 and send me results via my chart.   I discussed the assessment and treatment plan with the patient. The patient was provided an opportunity to ask questions and all were answered. The patient agreed with the plan and demonstrated an understanding of the instructions.   The patient was advised to call back or seek an in-person evaluation if the symptoms worsen or if the condition fails to improve as anticipated.   [name], NP

## 2018-12-26 ENCOUNTER — Ambulatory Visit: Payer: Self-pay | Admitting: Registered"

## 2018-12-27 ENCOUNTER — Encounter: Payer: Self-pay | Admitting: Registered"

## 2018-12-27 ENCOUNTER — Encounter: Payer: 59 | Attending: Adult Health | Admitting: Registered"

## 2018-12-27 ENCOUNTER — Other Ambulatory Visit: Payer: Self-pay

## 2018-12-27 DIAGNOSIS — Z713 Dietary counseling and surveillance: Secondary | ICD-10-CM | POA: Insufficient documentation

## 2018-12-27 NOTE — Patient Instructions (Addendum)
-   Aim to have non-starchy vegetables with lunch and dinner.   - Increase physical activity of 20 min, 2 days/week.  - Try to plan out meals for the upcoming week.

## 2018-12-27 NOTE — Progress Notes (Signed)
[employer] insurance visit: #1  Medical Nutrition Therapy:  Appt start time: 8:41 end time:  9:20.  Pt expectations: healthier foods, what adjustments need to be made to help with hypertension  Assessment:  Primary concerns today: Client states she has hypertension and gets frustrated with doing the right things and still has high blood pressure. States everyone in her family has high blood pressure.   Client reports she fasted yesterday until 12 pm; has been fasting 6 days/week. States it initially began when she was doing a challenge with friend and eliminating certain foods. States she did it for a while and the things were different in the way her clothes were fitting. States she started back with fasting and wants to continue on with it.   Reports she doe snot like milk or cheese, drinks almond milk as substitute.    Preferred Learning Style:   No preference indicated   Learning Readiness:   Ready  Change in progress   MEDICATIONS: See list   DIETARY INTAKE:  Usual eating pattern includes 2 meals and 0-1 snacks per day.  Everyday foods include fried fish, french fries, hush puppies, water, coffee.  Avoided foods include beef, pork (unless bacon), milk, cheese.    24-hr recall:  B ( AM): coffee  Snk ( AM): none  L ( PM): air fried wings + small fries Snk ( PM): sometimes chocolate chips cookies D ( PM): fried fish + greens + 3 hush puppies Snk ( PM): none Beverages: water (~48 oz), coffee, lemonade, almond milk  Usual physical activity: typically walks 3 miles/day, 3 days/week. Has not been able to go to the gym due to [place].    Estimated energy needs: 1800-2000 calories 200-225 g carbohydrates 135-150 g protein 50-56 g fat  Progress Towards Goal(s):  In progress.   Nutritional Diagnosis:  NB-1.1 Food and nutrition-related knowledge deficit As related to hypertension.  As evidenced by pt verbalizes incomplete information.    Intervention:  Nutrition  education and counseling. Client was educated and counseled on ways to increase fiber intake, benefits of having vegetables throughout the day, and benefits of planning meals ahead of time. Client was in agreement with goals listed.  Goals: - Aim to have non-starchy vegetables with lunch and dinner.  - Increase physical activity of 20 min, 2 days/week. - Try to plan out meals for the upcoming week.    Teaching Method Utilized:  Visual Auditory Hands on  Handouts given during visit include:  none  Barriers to learning/adherence to lifestyle change: none  Demonstrated degree of understanding via:  Teach Back   Monitoring/Evaluation:  Dietary intake, exercise, and body weight in 2 week(s).

## 2019-01-10 ENCOUNTER — Ambulatory Visit: Payer: 59 | Admitting: Registered"

## 2019-01-17 ENCOUNTER — Encounter: Payer: 59 | Admitting: Registered"

## 2019-01-17 ENCOUNTER — Other Ambulatory Visit: Payer: Self-pay

## 2019-01-17 ENCOUNTER — Encounter: Payer: Self-pay | Admitting: Registered"

## 2019-01-17 DIAGNOSIS — Z713 Dietary counseling and surveillance: Secondary | ICD-10-CM

## 2019-01-17 NOTE — Patient Instructions (Addendum)
-   Try to have snacks on-hand as supplement when running errands.   - Try to have non-starchy vegetables with lunch ex. salad, green beans, broccoli, california blend, etc.   - Grab some vegetables from [employer] to [employer] or mix for salad.   - Create balance with meals: protein, non-starchy vegetables, and starch/carbohydrates.

## 2019-01-17 NOTE — Progress Notes (Signed)
[place] insurance visit: #2  Medical Nutrition Therapy:  Appt start time: 5:03 end time:  5:39.  Pt expectations: healthier foods, what adjustments need to be made to help with hypertension  Assessment:  Primary concerns today: Pt states she has been more mindful of what she is eating. 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 Daughter is out of town.   States she injured her toe last week and has been having issues walking. Unable to walk for physical activity since then. Reports she was walking 30 min, 7 days/week prior to toe injury. States she feels hungry first thing in the morning; breakfast is her favorite meal. States it is not challenging to have vegetables and knows she just needs to do it. Has begun eating breakfast lately instead of skipping.   Motivation: does not want to be able to have "big" arms. Has clothing at home that does not fit her the way she wants it to.   Previous appts: Client states she has hypertension and gets frustrated with doing the right things and still has high blood pressure. States everyone in her family has high blood pressure.   Reports she does not like milk or cheese, drinks almond milk as substitute.    Preferred Learning Style:   No preference indicated   Learning Readiness:   Ready  Change in progress   MEDICATIONS: See list   DIETARY INTAKE:  Usual eating pattern includes 3 meals and 0-1 snacks per day.  Everyday foods include fried fish, french fries, hush puppies, water, coffee.  Avoided foods include beef, pork (unless bacon), milk, cheese.    24-hr recall:  B ( AM): cheerios + water or coffee  Snk ( AM): none  L (1 PM): air fried chicken tenders  + air fried small fries Snk ( PM): none D (10 PM): air fried wings + vegetable fried rice or fried fish + greens + 3 hush puppies Snk ( PM): none Beverages: water (~48 oz), coffee, lemonade, almond milk  Usual physical activity: typically walks 3  miles/day, 3 days/week. Has not been able to go to the gym due to [place].    Estimated energy needs: 1800-2000 calories 200-225 g carbohydrates 135-150 g protein 50-56 g fat  Progress Towards Goal(s):  In progress.   Nutritional Diagnosis:  NB-1.1 Food and nutrition-related knowledge deficit As related to hypertension.  As evidenced by pt verbalizes incomplete information.    Intervention:  Nutrition education and counseling. Client was educated and counseled on ways to increase non-starchy vegetable intake and diet culture. Client was in agreement with goals listed.  Goals: - Try to have snacks on-hand as supplement when running errands.  - Try to have non-starchy vegetables with lunch ex. salad, green beans, broccoli, cal[place] blend, etc.  - Grab some vegetables from [employer] to [employer] or mix for salad.  - Create balance with meals: protein, non-starchy vegetables, and starch/carbohydrates.    Teaching Method Utilized:  Visual Auditory Hands on  Handouts given during visit include:  none  Barriers to learning/adherence to lifestyle change: none  Demonstrated degree of understanding via:  Teach Back   Monitoring/Evaluation:  Dietary intake, exercise, and body weight in 1 week(s).

## 2019-01-24 ENCOUNTER — Ambulatory Visit: Payer: 59 | Admitting: Registered"

## 2019-01-29 ENCOUNTER — Other Ambulatory Visit: Payer: Self-pay

## 2019-01-29 ENCOUNTER — Encounter: Payer: 59 | Attending: Adult Health | Admitting: Registered"

## 2019-01-29 ENCOUNTER — Encounter: Payer: Self-pay | Admitting: Registered"

## 2019-01-29 DIAGNOSIS — Z713 Dietary counseling and surveillance: Secondary | ICD-10-CM | POA: Insufficient documentation

## 2019-01-29 NOTE — Progress Notes (Signed)
[place] insurance visit: #3  Medical Nutrition Therapy:  Appt start time: 12:08 end time: 12:25  Pt expectations: healthier foods, what adjustments need to be made to help with hypertension  Assessment:  Primary concerns today: Pt states she has not been working on anything since previous visit because her daughter was injured by dog. States meal planning did not happen. States her daughter had allergic reaction last week as well.   States daughter prepared dinner last night and did not include non-starchy vegetables with meal. States she did not supplement with vegetables nor ask daughter to prepare them. States she was enjoying having a day off from cooking.   Motivation: does not want to be able to have "big" arms. Has clothing at home that does not fit her the way she wants it to.   Previous appts: Client states she has hypertension and gets frustrated with doing the right things and still has high blood pressure. States everyone in her family has high blood pressure.States she injured her toe last week and has been having issues walking. Unable to walk for physical activity since then. Reports she was walking 30 min, 7 days/week prior to toe injury.  Reports she does not like milk or cheese, drinks almond milk as substitute.    Preferred Learning Style:   No preference indicated   Learning Readiness:   Ready  Change in progress   MEDICATIONS: See list   DIETARY INTAKE:  Usual eating pattern includes 3 meals and 0-1 snacks per day.  Everyday foods include fried fish, french fries, hush puppies, water, coffee.  Avoided foods include beef, pork (unless bacon), milk, cheese.    24-hr recall:  B ( AM): honey nut cheerios + water  Snk ( AM): unsalted chips L (1-2 PM): [place] burger + a few fries + water  Snk ( PM): oatmeal cookie + water D (10 PM): 2 salmon patties + rice + water Snk ( PM): [employer]: water (~48 oz), coffee, almond milk  Usual physical activity:  typically walks 3 miles/day, 3 days/week. Has not been able to go to the gym due to [place].    Estimated energy needs: 1800-2000 calories 200-225 g carbohydrates 135-150 g protein 50-56 g fat  Progress Towards Goal(s):  In progress.   Nutritional Diagnosis:  NB-1.1 Food and nutrition-related knowledge deficit As related to hypertension.  As evidenced by pt verbalizes incomplete information.    Intervention:  Nutrition education and counseling. Client was educated and counseled on ways to increase non-starchy vegetable intake. Client was in agreement with goals listed.  Goals: - Try to have snacks on-hand as supplement when running errands.  - Try to have non-starchy vegetables with lunch ex. salad, green beans, broccoli, [place] blend, etc.  - Grab some vegetables from [employer] to [employer] or mix for salad.  - Create balance with meals: protein, non-starchy vegetables, and starch/carbohydrates.    Teaching Method Utilized:  Visual Auditory Hands on  Handouts given during visit include:  none  Barriers to learning/adherence to lifestyle change: none  Demonstrated degree of understanding via:  Teach Back   Monitoring/Evaluation:  Dietary intake, exercise, and body weight prn.

## 2019-01-29 NOTE — Patient Instructions (Signed)
-   Try to have snacks on-hand as supplement when running errands.   - Try to have non-starchy vegetables with lunch ex. salad, green beans, broccoli, [place] blend, etc.   - Grab some vegetables from [employer] to [employer] or mix for salad.   - Create balance with meals: protein, non-starchy vegetables, and starch/carbohydrates.

## 2019-02-18 MED FILL — ATENOLOL 25 MG TABLET: 25 | 60 days supply | Qty: 60 | Fill #0

## 2019-03-06 MED FILL — BENAZEPRIL-HCTZ 20-25 MG TA: 20-25 | 60 days supply | Qty: 60 | Fill #0

## 2019-03-27 ENCOUNTER — Encounter: Payer: 59 | Admitting: Women's Health

## 2019-04-15 ENCOUNTER — Other Ambulatory Visit: Payer: Self-pay

## 2019-04-16 ENCOUNTER — Encounter: Payer: Self-pay | Admitting: Women's Health

## 2019-04-16 ENCOUNTER — Ambulatory Visit (INDEPENDENT_AMBULATORY_CARE_PROVIDER_SITE_OTHER): Payer: 59 | Admitting: Women's Health

## 2019-04-16 VITALS — BP 132/80 | Ht 64.0 in | Wt 222.0 lb

## 2019-04-16 DIAGNOSIS — Z01419 Encounter for gynecological examination (general) (routine) without abnormal findings: Secondary | ICD-10-CM | POA: Diagnosis not present

## 2019-04-16 DIAGNOSIS — N920 Excessive and frequent menstruation with regular cycle: Secondary | ICD-10-CM

## 2019-04-16 NOTE — Progress Notes (Signed)
Kelsey Cameron Jan 11, 1981 [id number]    History:    Presents for annual exam.  Monthly 5-day cycles first 2 to 3 days heavy changing protection every 2 hours.  Normal Pap history.  Has not had a screening mammogram.  Mother breast cancer at age 40 survivor BRCA testing not done.  Not sexually active greater than a year, denies need for STD screen.  Hypertension, primary care manages labs and meds.  Has seen a nutritionist working to get weight down.  Past medical history, past surgical history, family history and social history were all reviewed and documented in the EPIC chart.  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  ROS:  A ROS was performed and pertinent positives and negatives are included.  Exam:  Vitals:   04/16/19 1227  BP: 132/80  Weight: 222 lb (100.7 kg)  Height: _0  (1.626 m)   Body mass index is 38.11 kg/m.   General appearance:  Normal Thyroid:  Symmetrical, normal in size, without palpable masses or nodularity. Respiratory  Auscultation:  Clear without wheezing or rhonchi Cardiovascular  Auscultation:  Regular rate, without rubs, murmurs or gallops  Edema/varicosities:  Not grossly evident Abdominal  Soft,nontender, without masses, guarding or rebound.  Liver/spleen:  No organomegaly noted  Hernia:  None appreciated  Skin  Inspection:  Grossly normal   Breasts: Examined lying and sitting.  Bilateral reduction     Right: Without masses, retractions, discharge or axillary adenopathy.     Left: Without masses, retractions, discharge or axillary adenopathy. Gentitourinary   Inguinal/mons:  Normal without inguinal adenopathy  External genitalia:  Normal  BUS/Urethra/Skene's glands:  Normal  Vagina:  Normal  Cervix:  Normal  Uterus: 8-week size questionable fibroids, shape and contour.  Midline and mobile  Adnexa/parametria:     Rt: Without masses or tenderness.   Lt: Without masses or tenderness.  Anus and perineum: Normal  Digital  rectal exam: Normal sphincter tone without palpated masses or tenderness  Assessment/Plan:  38 y.o. SBF G2 P1 for annual exam, reports cycle changes, heavier flow.  Regular monthly 5-day cycle first 2 to 3 days heavy flow changing protection every 2 hours Questionable fibroid uterus Contraception management Hypertension-primary care manages labs and meds Obesity  Plan:.  Schedule ultrasound after next cycle, questionable fibroids may be causing heavier menstrual flow.  SBEs, annual screening mammogram, breast center information given instructed to schedule.  Continue working with nutritionist, decrease calories/carbs, increase regular exercise encouraged.  Vitamin D 1000 daily encouraged.  Contraception declined, will continue condoms if sexually active.  Pap normal 2018, new screening guidelines reviewed.    Huel Cote [facility], 12:51 PM 04/16/2019

## 2019-04-16 NOTE — Patient Instructions (Addendum)
Breast center [phone]  It was good seeing you today  Health Maintenance, Female Adopting a healthy lifestyle and getting preventive care are important in promoting health and wellness. Ask your health care provider about:  The right schedule for you to have regular tests and exams.  Things you can do on your own to prevent diseases and keep yourself healthy. What should I know about diet, weight, and exercise? Eat a healthy diet   Eat a diet that includes plenty of vegetables, fruits, low-fat dairy products, and lean protein.  Do not eat a lot of foods that are high in solid fats, added sugars, or sodium. Maintain a healthy weight Body mass index (BMI) is used to identify weight problems. It estimates body fat based on height and weight. Your health care provider can help determine your BMI and help you achieve or maintain a healthy weight. Get regular exercise Get regular exercise. This is one of the most important things you can do for your health. Most adults should:  Exercise for at least 150 minutes each week. The exercise should increase your heart rate and make you sweat (moderate-intensity exercise).  Do strengthening exercises at least twice a week. This is in addition to the moderate-intensity exercise.  Spend less time sitting. Even light physical activity can be beneficial. Watch cholesterol and blood lipids Have your blood tested for lipids and cholesterol at 40 years of age, then have this test every 5 years. Have your cholesterol levels checked more often if:  Your lipid or cholesterol levels are high.  You are older than 40 years of age.  You are at high risk for heart disease. What should I know about cancer screening? Depending on your health history and family history, you may need to have cancer screening at various ages. This may include screening for:  Breast cancer.  Cervical cancer.  Colorectal cancer.  Skin cancer.  Lung cancer. What should I  know about heart disease, diabetes, and high blood pressure? Blood pressure and heart disease  High blood pressure causes heart disease and increases the risk of stroke. This is more likely to develop in people who have high blood pressure readings, are of African descent, or are overweight.  Have your blood pressure checked: ? Every 3-5 years if you are 107-57 years of age. ? Every year if you are 22 years old or older. Diabetes Have regular diabetes screenings. This checks your fasting blood sugar level. Have the screening done:  Once every three years after age 51 if you are at a normal weight and have a low risk for diabetes.  More often and at a younger age if you are overweight or have a high risk for diabetes. What should I know about preventing infection? Hepatitis B If you have a higher risk for hepatitis B, you should be screened for this virus. Talk with your health care provider to find out if you are at risk for hepatitis B infection. Hepatitis C Testing is recommended for:  Everyone born from [birth year] through 1967.  Anyone with known risk factors for hepatitis C. Sexually transmitted infections (STIs)  Get screened for STIs, including gonorrhea and chlamydia, if: ? You are sexually active and are younger than 40 years of age. ? You are older than 40 years of age and your health care provider tells you that you are at risk for this type of infection. ? Your sexual activity has changed since you were last screened, and you are at increased  risk for chlamydia or gonorrhea. Ask your health care provider if you are at risk.  Ask your health care provider about whether you are at high risk for HIV. Your health care provider may recommend a prescription medicine to help prevent HIV infection. If you choose to take medicine to prevent HIV, you should first get tested for HIV. You should then be tested every 3 months for as long as you are taking the medicine. Pregnancy  If you are  about to stop having your period (premenopausal) and you may become pregnant, seek counseling before you get pregnant.  Take 400 to 800 micrograms (mcg) of folic acid every day if you become pregnant.  Ask for birth control (contraception) if you want to prevent pregnancy. Osteoporosis and menopause Osteoporosis is a disease in which the bones lose minerals and strength with aging. This can result in bone fractures. If you are 43 years old or older, or if you are at risk for osteoporosis and fractures, ask your health care provider if you should:  Be screened for bone loss.  Take a calcium or vitamin D supplement to lower your risk of fractures.  Be given hormone replacement therapy (HRT) to treat symptoms of menopause. Follow these instructions at home: Lifestyle  Do not use any products that contain nicotine or tobacco, such as cigarettes, e-cigarettes, and chewing tobacco. If you need help quitting, ask your health care provider.  Do not use street drugs.  Do not share needles.  Ask your health care provider for help if you need support or information about quitting drugs. Alcohol use  Do not drink alcohol if: ? Your health care provider tells you not to drink. ? You are pregnant, may be pregnant, or are planning to become pregnant.  If you drink alcohol: ? Limit how much you use to 0-1 drink a day. ? Limit intake if you are breastfeeding.  Be aware of how much alcohol is in your drink. In the [place] one drink equals one 12 oz bottle of beer (355 mL), one 5 oz glass of wine (148 mL), or one 1 oz glass of hard liquor (44 mL). General instructions  Schedule regular health, dental, and eye exams.  Stay current with your vaccines.  Tell your health care provider if: ? You often feel depressed. ? You have ever been abused or do not feel safe at home. Summary  Adopting a healthy lifestyle and getting preventive care are important in promoting health and wellness.  Follow  your health care provider's instructions about healthy diet, exercising, and getting tested or screened for diseases.  Follow your health care provider's instructions on monitoring your cholesterol and blood pressure. This information is not intended to replace advice given to you by your health care provider. Make sure you discuss any questions you have with your health care provider. Document Released: 12/20/2010 Document Revised: 05/30/2018 Document Reviewed: 05/30/2018 Elsevier Patient Education  2020 [employer].

## 2019-05-01 ENCOUNTER — Telehealth: Payer: Self-pay | Admitting: *Deleted

## 2019-05-01 DIAGNOSIS — N852 Hypertrophy of uterus: Secondary | ICD-10-CM

## 2019-05-01 NOTE — Telephone Encounter (Signed)
Pt called to ask if Kelsey Cameron IUD would be good for her.  I reminded her that [name] recommended an [place] to evaluate large uterus on exam possible fibroids after this next cycle.  I told her it depends on size and location of fibroids if IUD would be an option.  She understood.  Apt for [place] made based off of when next period should be. KW CMA

## 2019-05-27 ENCOUNTER — Other Ambulatory Visit: Payer: 59

## 2019-05-27 ENCOUNTER — Ambulatory Visit: Payer: 59 | Admitting: Women's Health

## 2019-06-20 ENCOUNTER — Other Ambulatory Visit: Payer: Self-pay

## 2019-06-24 ENCOUNTER — Ambulatory Visit: Payer: 59 | Admitting: Women's Health

## 2019-06-24 ENCOUNTER — Other Ambulatory Visit: Payer: 59

## 2019-06-25 ENCOUNTER — Ambulatory Visit: Payer: 59 | Admitting: Women's Health

## 2019-06-25 ENCOUNTER — Other Ambulatory Visit: Payer: 59

## 2019-07-23 ENCOUNTER — Other Ambulatory Visit: Payer: Self-pay | Admitting: Adult Health

## 2019-07-23 DIAGNOSIS — Z760 Encounter for issue of repeat prescription: Secondary | ICD-10-CM

## 2019-07-24 MED FILL — ATENOLOL 25 MG TABLET: 25 | 60 days supply | Qty: 60 | Fill #0

## 2019-07-29 ENCOUNTER — Other Ambulatory Visit: Payer: Self-pay | Admitting: *Deleted

## 2019-07-29 DIAGNOSIS — Z20822 Contact with and (suspected) exposure to covid-19: Secondary | ICD-10-CM

## 2019-07-30 LAB — NOVEL CORONAVIRUS, NAA: SARS-CoV-2, NAA: NOT DETECTED

## 2019-08-07 MED FILL — ATENOLOL 25 MG TABLET: 25 | 60 days supply | Qty: 60 | Fill #0

## 2019-09-09 ENCOUNTER — Ambulatory Visit: Payer: 59 | Admitting: Women's Health

## 2019-09-09 ENCOUNTER — Other Ambulatory Visit: Payer: 59

## 2019-09-17 ENCOUNTER — Other Ambulatory Visit: Payer: Self-pay

## 2019-09-18 ENCOUNTER — Ambulatory Visit (INDEPENDENT_AMBULATORY_CARE_PROVIDER_SITE_OTHER): Payer: 59 | Admitting: Women's Health

## 2019-09-18 ENCOUNTER — Ambulatory Visit (INDEPENDENT_AMBULATORY_CARE_PROVIDER_SITE_OTHER): Payer: 59

## 2019-09-18 ENCOUNTER — Other Ambulatory Visit: Payer: Self-pay | Admitting: Women's Health

## 2019-09-18 ENCOUNTER — Encounter: Payer: Self-pay | Admitting: Women's Health

## 2019-09-18 DIAGNOSIS — D219 Benign neoplasm of connective and other soft tissue, unspecified: Secondary | ICD-10-CM

## 2019-09-18 DIAGNOSIS — N852 Hypertrophy of uterus: Secondary | ICD-10-CM

## 2019-09-18 DIAGNOSIS — Z113 Encounter for screening for infections with a predominantly sexual mode of transmission: Secondary | ICD-10-CM

## 2019-09-18 DIAGNOSIS — N920 Excessive and frequent menstruation with regular cycle: Secondary | ICD-10-CM

## 2019-09-18 NOTE — Patient Instructions (Addendum)
Breast center [phone] Levonorgestrel intrauterine device (IUD) What is this medicine? LEVONORGESTREL IUD (LEE voe nor jes trel) is a contraceptive (birth control) device. The device is placed inside the uterus by a healthcare professional. It is used to prevent pregnancy. This device can also be used to treat heavy bleeding that occurs during your period. This medicine may be used for other purposes; ask your health care provider or pharmacist if you have questions. COMMON BRAND NAME(S): Minette Headland What should I tell my health care provider before I take this medicine? They need to know if you have any of these conditions:  abnormal Pap smear  cancer of the breast, uterus, or cervix  diabetes  endometritis  genital or pelvic infection now or in the past  have more than one sexual partner or your partner has more than one partner  heart disease  history of an ectopic or tubal pregnancy  immune system problems  IUD in place  liver disease or tumor  problems with blood clots or take blood-thinners  seizures  use intravenous drugs  uterus of unusual shape  vaginal bleeding that has not been explained  an unusual or allergic reaction to levonorgestrel, other hormones, silicone, or polyethylene, medicines, foods, dyes, or preservatives  pregnant or trying to get pregnant  breast-feeding How should I use this medicine? This device is placed inside the uterus by a health care professional. Talk to your pediatrician regarding the use of this medicine in children. Special care may be needed. Overdosage: If you think you have taken too much of this medicine contact a poison control center or emergency room at once. NOTE: This medicine is only for you. Do not share this medicine with others. What if I miss a dose? This does not apply. Depending on the brand of device you have inserted, the device will need to be replaced every 3 to 6 years if you wish to  continue using this type of birth control. What may interact with this medicine? Do not take this medicine with any of the following medications:  amprenavir  bosentan  fosamprenavir This medicine may also interact with the following medications:  aprepitant  armodafinil  barbiturate medicines for inducing sleep or treating seizures  bexarotene  boceprevir  griseofulvin  medicines to treat seizures like carbamazepine, ethotoin, felbamate, oxcarbazepine, phenytoin, topiramate  modafinil  pioglitazone  rifabutin  rifampin  rifapentine  some medicines to treat HIV infection like atazanavir, efavirenz, indinavir, lopinavir, nelfinavir, tipranavir, ritonavir  St. John's wort  warfarin This list may not describe all possible interactions. Give your health care provider a list of all the medicines, herbs, non-prescription drugs, or dietary supplements you use. Also tell them if you smoke, drink alcohol, or use illegal drugs. Some items may interact with your medicine. What should I watch for while using this medicine? Visit your doctor or health care professional for regular check ups. See your doctor if you or your partner has sexual contact with others, becomes HIV positive, or gets a sexual transmitted disease. This product does not protect you against HIV infection (AIDS) or other sexually transmitted diseases. You can check the placement of the IUD yourself by reaching up to the top of your vagina with clean fingers to feel the threads. Do not pull on the threads. It is a good habit to check placement after each menstrual period. Call your doctor right away if you feel more of the IUD than just the threads or if you cannot feel  the threads at all. The IUD may come out by itself. You may become pregnant if the device comes out. If you notice that the IUD has come out use a backup birth control method like condoms and call your health care provider. Using tampons will not  change the position of the IUD and are okay to use during your period. This IUD can be safely scanned with magnetic resonance imaging (MRI) only under specific conditions. Before you have an MRI, tell your healthcare provider that you have an IUD in place, and which type of IUD you have in place. What side effects may I notice from receiving this medicine? Side effects that you should report to your doctor or health care professional as soon as possible:  allergic reactions like skin rash, itching or hives, swelling of the face, lips, or tongue  fever, flu-like symptoms  genital sores  high blood pressure  no menstrual period for 6 weeks during use  pain, swelling, warmth in the leg  pelvic pain or tenderness  severe or sudden headache  signs of pregnancy  stomach cramping  sudden shortness of breath  trouble with balance, talking, or walking  unusual vaginal bleeding, discharge  yellowing of the eyes or skin Side effects that usually do not require medical attention (report to your doctor or health care professional if they continue or are bothersome):  acne  breast pain  change in sex drive or performance  changes in weight  cramping, dizziness, or faintness while the device is being inserted  headache  irregular menstrual bleeding within first 3 to 6 months of use  nausea This list may not describe all possible side effects. Call your doctor for medical advice about side effects. You may report side effects to FDA at 1-800-FDA-1088. Where should I keep my medicine? This does not apply. NOTE: This sheet is a summary. It may not cover all possible information. If you have questions about this medicine, talk to your doctor, pharmacist, or health care provider.  2020 Elsevier/Gold Standard (2018-04-17 13:22:01)

## 2019-09-18 NOTE — Progress Notes (Signed)
39 year old SPF G2, P1 presents for ultrasound.  Has had increasing menorrhagia with regular monthly cycles, no intermenstrual spotting or bleeding.  Cycles last 5 days first 2 to 3 days heavy changing protection every 2 hours.  Same partner/ condoms consistently.  Medical problems include Hypertension.  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  Ultrasound: Both abdominal and transabdominal views necessary to evaluate pelvic anatomy.  Anteverted slightly enlarged uterus retroflexed at C-section scar.  3 small intramural fibroids 1 to 2 cm in size.  Thin symmetrical endometrium best seen abdominally no masses or thickening seen.  Both ovaries normal size with normal follicular pattern.  25 x 22 cystic structure with debris in left ovaries probable corpus luteal cyst day 18 of cycle.  No adnexal masses or free fluid.  Small fibroid uterus  Plan: Ultrasound reviewed fibroids benign but may be causing some of the heavy cycles.  Contraception options reviewed, Mirena IUD discussed risks of infection, hemorrhage or infection, placed with menses.  We will schedule with Dr. Delilah Shan with next cycle.  GC/Chlamydia (urine) culture pending.

## 2019-09-19 LAB — C. TRACHOMATIS/N. GONORRHOEAE RNA
C. trachomatis RNA, TMA: NOT DETECTED
N. gonorrhoeae RNA, TMA: NOT DETECTED

## 2019-09-26 ENCOUNTER — Telehealth (INDEPENDENT_AMBULATORY_CARE_PROVIDER_SITE_OTHER): Payer: 59 | Admitting: Adult Health

## 2019-09-26 ENCOUNTER — Encounter: Payer: Self-pay | Admitting: Adult Health

## 2019-09-26 VITALS — Temp 97.0°F | Wt 215.0 lb

## 2019-09-26 DIAGNOSIS — J0111 Acute recurrent frontal sinusitis: Secondary | ICD-10-CM

## 2019-09-26 DIAGNOSIS — I10 Essential (primary) hypertension: Secondary | ICD-10-CM

## 2019-09-26 MED ORDER — BENAZEPRIL-HYDROCHLOROTHIAZIDE 20-25 MG PO TABS: 1.0000 | ORAL_TABLET | Freq: Every day | ORAL | 0 refills | Status: DC

## 2019-09-26 MED ORDER — FLUCONAZOLE 150 MG PO TABS: 150.0000 mg | ORAL_TABLET | Freq: Once | ORAL | 1 refills | Status: AC

## 2019-09-26 MED ORDER — AZITHROMYCIN 250 MG PO TABS: ORAL_TABLET | ORAL | 0 refills | Status: DC

## 2019-09-26 MED ORDER — IBUPROFEN 600 MG PO TABS: 600.0000 mg | ORAL_TABLET | Freq: Three times a day (TID) | ORAL | 1 refills | Status: DC | PRN

## 2019-09-26 MED ORDER — ATENOLOL 25 MG PO TABS: 25.0000 mg | ORAL_TABLET | Freq: Every day | ORAL | 0 refills | Status: DC

## 2019-09-26 MED FILL — AZITHROMYCIN 250 MG TABLET: 250 | 5 days supply | Qty: 6 | Fill #0

## 2019-09-26 MED FILL — ATENOLOL 25 MG TABLET: 25 | 90 days supply | Qty: 90 | Fill #0

## 2019-09-26 MED FILL — FLUCONAZOLE 150 MG TABLET: 150 | 1 days supply | Qty: 1 | Fill #0

## 2019-09-26 MED FILL — IBUPROFEN 600 MG TABLET: 600 | 10 days supply | Qty: 30 | Fill #0

## 2019-09-26 NOTE — Progress Notes (Signed)
Virtual Visit via Video Note  I connected with Kelsey Cameron on 09/26/19 at 10:30 AM EDT by a video enabled telemedicine application and verified that I am speaking with the correct person using two identifiers.  Location patient: home Location provider:work or home office Persons participating in the virtual visit: patient, provider  I discussed the limitations of evaluation and management by telemedicine and the availability of in person appointments. The patient expressed understanding and agreed to proceed.   HPI: She is being evaluated today for an acute issue of concern for sinus infection.  She reports that her symptoms have been waxing and waning over the last 3 weeks and that over the last 24 to 36 hours her symptoms have become more severe.  Her symptoms include frontal sinus pain and pressure, chills, decreased appetite, and fatigue.  She denies fevers, ear pain, headaches, dizziness, lightheadedness, nausea, or vomiting.  Symptoms are consistent with previous sinus infections that she has had.  Has been seen by ear nose and throat in the past and they could not find a reason why she experiences so many sinus infections.  Her last sinus infection was in October 2019  Additionally she needs a refill of her blood pressure medication   ROS: See pertinent positives and negatives per HPI.  Past Medical History:  Diagnosis Date  . Hypertension   . Macromastia   . PONV (postoperative nausea and vomiting)   . STD (sexually transmitted disease) 2002   [place]    Past Surgical History:  Procedure Laterality Date  . BREAST REDUCTION SURGERY  05/17/2011   Procedure: MAMMARY REDUCTION BILATERAL (BREAST);  Surgeon: Mary A Contogiannis;  Location: [place];  Service: Plastics;  Laterality: Bilateral;  . CESAREAN SECTION  09/25/2006  . CHOLECYSTECTOMY  2009    Family History  Problem Relation Age of Onset  . Aneurysm Mother   . Hypertension Mother   . Breast  cancer Mother 102  . Aneurysm Father   . Hypertension Father   . Heart disease Father   . Breast cancer Maternal Aunt   . Breast cancer Maternal Grandmother   . Cancer Other        Current Outpatient Medications:  .  atenolol (TENORMIN) 25 MG tablet, TAKE 1 TABLET BY MOUTH ONCE DAILY, Disp: 60 tablet, Rfl: 0 .  benazepril-hydrochlorthiazide (LOTENSIN HCT) 20-25 MG tablet, Take 1 tablet by mouth daily., Disp: 90 tablet, Rfl: 0 .  cetirizine (ZYRTEC) 5 MG tablet, Take 5 mg by mouth daily., Disp: , Rfl:  .  fluticasone (FLONASE) 50 MCG/ACT nasal spray, Place 1 spray into both nostrils in the morning and at bedtime., Disp: , Rfl:  .  Multiple Vitamin (MULTIVITAMIN) capsule, Take 1 capsule by mouth daily. Reported on 07/09/2015, Disp: , Rfl:  .  NONFORMULARY OR COMPOUNDED ITEM, Boric acid gel caps 600 mg insert one tablet vaginally twice weekly, Disp: 30 each, Rfl: 0 .  omeprazole (PRILOSEC) 20 MG capsule, Take 1 capsule (20 mg total) by mouth daily., Disp: 30 capsule, Rfl: 0 .  albuterol (PROVENTIL HFA;VENTOLIN HFA) 108 (90 Base) MCG/ACT inhaler, Inhale 2 puffs into the lungs every 6 (six) hours as needed for wheezing or shortness of breath., Disp: 1 Inhaler, Rfl: 0  EXAM:  VITALS per patient if applicable:  GENERAL: alert, oriented, appears well and in no acute distress  HEENT: atraumatic, conjunttiva clear, no obvious abnormalities on inspection of external nose and ears  NECK: normal movements of the head and neck  LUNGS:  on inspection no signs of respiratory distress, breathing rate appears normal, no obvious gross SOB, gasping or wheezing  CV: no obvious cyanosis  MS: moves all visible extremities without noticeable abnormality  PSYCH/NEURO: pleasant and cooperative, no obvious depression or anxiety, speech and thought processing grossly intact  ASSESSMENT AND PLAN:  Discussed the following assessment and plan:  1. Acute recurrent frontal sinusitis  - azithromycin  (ZITHROMAX Z-PAK) 250 MG tablet; Take 2 tablets on Day 1.  Then take 1 tablet daily.  Dispense: 6 tablet; Refill: 0 - fluconazole (DIFLUCAN) 150 MG tablet; Take 1 tablet (150 mg total) by mouth once for 1 dose.  Dispense: 1 tablet; Refill: 1 - Follow up in one week if no improvement   2. Essential hypertension - Advised that she is overdue for CPE. Will send in 90 days to get her through until she can be seen - atenolol (TENORMIN) 25 MG tablet; Take 1 tablet (25 mg total) by mouth daily.  Dispense: 90 tablet; Refill: 0 - benazepril-hydrochlorthiazide (LOTENSIN HCT) 20-25 MG tablet; Take 1 tablet by mouth daily.  Dispense: 90 tablet; Refill: 0     I discussed the assessment and treatment plan with the patient. The patient was provided an opportunity to ask questions and all were answered. The patient agreed with the plan and demonstrated an understanding of the instructions.   The patient was advised to call back or seek an in-person evaluation if the symptoms worsen or if the condition fails to improve as anticipated.   [name], NP

## 2019-09-30 ENCOUNTER — Ambulatory Visit (INDEPENDENT_AMBULATORY_CARE_PROVIDER_SITE_OTHER): Payer: 59 | Admitting: Obstetrics and Gynecology

## 2019-09-30 ENCOUNTER — Other Ambulatory Visit: Payer: Self-pay

## 2019-09-30 ENCOUNTER — Encounter: Payer: Self-pay | Admitting: Obstetrics and Gynecology

## 2019-09-30 DIAGNOSIS — Z30430 Encounter for insertion of intrauterine contraceptive device: Secondary | ICD-10-CM | POA: Diagnosis not present

## 2019-09-30 NOTE — Progress Notes (Signed)
   Kelsey Cameron  1981/03/01 [id number]  HPI The patient is a 39 y.o. G2P1011 who presents today for Mirena IUD placement.  It would be her first IUD.  She did have an ultrasound 09/18/2019 indicating a large uterus 12.1 x 5.4 x 4.6 cm with small intramural fibroids and no cavity distortion.  Her left ovary did have what appeared to be a 2.5 cm corpus luteal cyst.  Per discussion with [name], NP she decided that she wanted to have a Mirena IUD placed.    I counseled her on the Mirena IUD including in the initial period of time after insertion to expect cramping and/or abnormal bleeding, especially in the first 3-6 months use.  After this time period the period can get very light or some women become amenorrheic.  Insertion risks include infection, uterine perforation and device migration into the abdomen which would require surgical removal, or the device can become dislodged causing pain or fall out of which would preclude effective contraception.  Potential for hormonal side effects discussed, possible increasing frequency of ovarian cysts.  Encouraged monthly self-string checks.   Physical Exam  BP 124/82   LMP 09/28/2019   General: Pleasant female, no acute distress, alert and oriented PELVIC EXAM: VULVA: normal appearing vulva with no masses, tenderness or lesions, VAGINA: normal appearing vagina with normal color and discharge, no lesions, vaginal cavity is long and narrow, CERVIX: normal appearing cervix without discharge or lesions, UTERUS: uterus is normal size, shape, consistency and nontender, enlarged anteverted mobile uterus about 10 weeks size  Procedure note Mirena IUD placement The cervix was cleansed with a Betadine swab.  The vaginal cavity was noted to be long and narrow and the long Peterson speculum was utilized to best visualize the cervix.  The patient's hips were flexed with an adjustment to the exam table, and the anterior lip of the cervix was grasped with an  Allis clamp to help provide traction to the cervix to reveal the external cervical os.  The cervix was gently dilated with the cervical os finder.  Endometrial pipelle was used to obtain a sound length of 10 cm.  Maintaining sterility, the flange on the IUD insertion tube was set to the appropriate length and the insertion tube was inserted into the cervix and endometrial cavity without difficulty until gentle fundal resistance was met.  The arms of the IUD were deployed and the Mirena was inserted without difficulty. The strings were trimmed to a length of 2 cm.  The patient tolerated the procedure well without any complication.   Caryn Bee present for exam and procedure   Joseph Pierini MD, [facility] 09/30/19

## 2019-10-01 ENCOUNTER — Encounter: Payer: Self-pay | Admitting: Gynecology

## 2019-10-30 ENCOUNTER — Emergency Department (HOSPITAL_COMMUNITY)
Admission: EM | Admit: 2019-10-30 | Discharge: 2019-10-30 | Disposition: A | Discharge status: Home / Self Care | Payer: 59 | Attending: Emergency Medicine | Admitting: Emergency Medicine

## 2019-10-30 ENCOUNTER — Encounter (HOSPITAL_COMMUNITY): Payer: Self-pay | Admitting: Emergency Medicine

## 2019-10-30 ENCOUNTER — Other Ambulatory Visit: Payer: Self-pay

## 2019-10-30 ENCOUNTER — Encounter: Payer: Self-pay | Admitting: Adult Health

## 2019-10-30 ENCOUNTER — Telehealth: Payer: 59 | Admitting: Adult Health

## 2019-10-30 DIAGNOSIS — W260XXA Contact with knife, initial encounter: Secondary | ICD-10-CM | POA: Diagnosis not present

## 2019-10-30 DIAGNOSIS — Y939 Activity, unspecified: Secondary | ICD-10-CM | POA: Insufficient documentation

## 2019-10-30 DIAGNOSIS — Z87891 Personal history of nicotine dependence: Secondary | ICD-10-CM | POA: Diagnosis not present

## 2019-10-30 DIAGNOSIS — Y999 Unspecified external cause status: Secondary | ICD-10-CM | POA: Diagnosis not present

## 2019-10-30 DIAGNOSIS — I10 Essential (primary) hypertension: Secondary | ICD-10-CM | POA: Insufficient documentation

## 2019-10-30 DIAGNOSIS — Y929 Unspecified place or not applicable: Secondary | ICD-10-CM | POA: Diagnosis not present

## 2019-10-30 DIAGNOSIS — S61011A Laceration without foreign body of right thumb without damage to nail, initial encounter: Secondary | ICD-10-CM | POA: Insufficient documentation

## 2019-10-30 DIAGNOSIS — Z79899 Other long term (current) drug therapy: Secondary | ICD-10-CM | POA: Diagnosis not present

## 2019-10-30 MED ORDER — LIDOCAINE-EPINEPHRINE (PF) 2 %-1:200000 IJ SOLN
10.0000 mL | Freq: Once | INTRAMUSCULAR | Status: AC
  Administered 2019-10-30: 10 mL via INTRADERMAL
  Filled 2019-10-30: qty 20

## 2019-10-30 NOTE — Discharge Instructions (Signed)
Typically on the hand we have you get stitches removed sometime between day 7 and day 10.  Please return for redness drainage or if you get a fever.  It is okay to get the sweats water can run over it but do not scrub it.

## 2019-10-30 NOTE — ED Provider Notes (Signed)
[facility] EMERGENCY DEPARTMENT Provider Note   CSN: [id number] Arrival date & time: 10/30/19  0118     History Chief Complaint  Patient presents with  . Laceration    Kelsey Cameron is a 39 y.o. female.  39 yo F with a chief complaints of a cut to her right thumb.  The patient was trying to put away a knife and she lost her grip on it and then when she tried to catch it she grabbed onto the blade.  She tried to get liquid Band-Aid applied to it but was worried it was coming continued bleeding and so came here.  She denies other injury.  Thinks her tetanus is up-to-date.  The history is provided by the patient.  Laceration Location:  Finger Finger laceration location:  R thumb Length:  2.8 Depth:  Through dermis Quality: straight   Bleeding: controlled   Time since incident:  2 hours Laceration mechanism:  Knife Pain details:    Quality:  Sharp   Severity:  Moderate   Timing:  Constant   Progression:  Unchanged Foreign body present:  No foreign bodies Relieved by:  Nothing Worsened by:  Nothing Ineffective treatments:  None tried Tetanus status:  Up to date Associated symptoms: no fever        Past Medical History:  Diagnosis Date  . Hypertension   . Macromastia   . PONV (postoperative nausea and vomiting)   . STD (sexually transmitted disease) 2002   [place]    Patient Active Problem List   Diagnosis Date Noted  . BV (bacterial vaginosis) 09/13/2017  . Hypertension 11/17/2011  . 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 ABDOMINAL PAIN 06/20/2007  . ESOPHAGITIS 05/31/2007    Past Surgical History:  Procedure Laterality Date  . BREAST REDUCTION SURGERY  05/17/2011   Procedure: MAMMARY REDUCTION BILATERAL (BREAST);  Surgeon: Mary A Contogiannis;  Location: [place];  Service: Plastics;  Laterality: Bilateral;  . CESAREAN SECTION  09/25/2006  . CHOLECYSTECTOMY  2009     OB History    Gravida  2   Para  1   Term        Preterm      AB  1   Living  1     SAB  1   TAB      Ectopic      Multiple      Live Births              Family History  Problem Relation Age of Onset  . Aneurysm Mother   . Hypertension Mother   . Breast cancer Mother 43  . Aneurysm Father   . Hypertension Father   . Heart disease Father   . Breast cancer Maternal Aunt   . Breast cancer Maternal Grandmother   . Cancer Other     Social History   Tobacco Use  . Smoking status: Former Smoker    Types: Cigarettes  . Smokeless tobacco: Never Used  . Tobacco comment: quit smoking 10/2010  Substance Use Topics  . Alcohol use: No    Alcohol/week: 0.0 standard drinks  . Drug use: No    Home Medications Prior to Admission medications   Medication Sig Start Date End Date Taking? Authorizing Provider  albuterol (PROVENTIL HFA;VENTOLIN HFA) 108 (90 Base) MCG/ACT inhaler Inhale 2 puffs into the lungs every 6 (six) hours as needed for wheezing or shortness of breath. 10/18/17 09/18/19  Martinique, Betty G, MD  atenolol (  TENORMIN) 25 MG tablet Take 1 tablet (25 mg total) by mouth daily. 09/26/19   Nafziger, Tommi Rumps, NP  azithromycin (ZITHROMAX Z-PAK) 250 MG tablet Take 2 tablets on Day 1.  Then take 1 tablet daily. 09/26/19   Nafziger, Tommi Rumps, NP  benazepril-hydrochlorthiazide (LOTENSIN HCT) 20-25 MG tablet Take 1 tablet by mouth daily. 09/26/19   Nafziger, Tommi Rumps, NP  cetirizine (ZYRTEC) 5 MG tablet Take 5 mg by mouth daily.    [provider]  fluticasone (FLONASE) 50 MCG/ACT nasal spray Place 1 spray into both nostrils in the morning and at bedtime.    [provider]  ibuprofen (ADVIL) 600 MG tablet Take 1 tablet (600 mg total) by mouth every 8 (eight) hours as needed. 09/26/19   Nafziger, Tommi Rumps, NP  Multiple Vitamin (MULTIVITAMIN) capsule Take 1 capsule by mouth daily. Reported on 07/09/2015    [provider]  NONFORMULARY OR COMPOUNDED ITEM Boric acid gel caps 600 mg insert one tablet vaginally twice weekly  09/14/17   Huel Cote, NP  omeprazole (PRILOSEC) 20 MG capsule Take 1 capsule (20 mg total) by mouth daily. 01/12/18   Nafziger, Tommi Rumps, NP    Allergies    Amoxicillin, Penicillins, and Doxycycline  Review of Systems   Review of Systems  Constitutional: Negative for chills and fever.  HENT: Negative for congestion and rhinorrhea.   Eyes: Negative for redness and visual disturbance.  Respiratory: Negative for shortness of breath and wheezing.   Cardiovascular: Negative for chest pain and palpitations.  Gastrointestinal: Negative for nausea and vomiting.  Genitourinary: Negative for dysuria and urgency.  Musculoskeletal: Negative for arthralgias and myalgias.  Skin: Positive for wound. Negative for pallor.  Neurological: Negative for dizziness and headaches.    Physical Exam Updated Vital Signs BP (!) 176/116 (BP Location: Left Arm)   Pulse 73   Temp 98.3 F (36.8 C) (Oral)   Resp 16   SpO2 99%   Physical Exam Vitals and nursing note reviewed.  Constitutional:      General: She is not in acute distress.    Appearance: She is well-developed. She is not diaphoretic.  HENT:     Head: Normocephalic and atraumatic.  Eyes:     Pupils: Pupils are equal, round, and reactive to light.  Pulmonary:     Effort: Pulmonary effort is normal.  Abdominal:     General: There is no distension.  Musculoskeletal:        General: Tenderness present.     Cervical back: Normal range of motion and neck supple.     Comments: Laceration just to the dermis of right thumb just lateral to the nail.  Intact sensation distally.  Bleeding controlled.  Skin:    General: Skin is warm and dry.  Neurological:     Mental Status: She is alert and oriented to person, place, and time.  Psychiatric:        Behavior: Behavior normal.     ED Results / Procedures / Treatments   Labs (all labs ordered are listed, but only abnormal results are displayed) Labs Reviewed - No data to  display  EKG None  Radiology No results found.  Procedures .Marland KitchenLaceration Repair  Date/Time: 10/30/2019 2:15 AM Performed by: Deno Etienne, DO Authorized by: Deno Etienne, DO   Consent:    Consent obtained:  Verbal   Consent given by:  Patient   Risks discussed:  Infection, pain, poor cosmetic result and poor wound healing   Alternatives discussed:  No treatment, delayed  treatment and observation Anesthesia (see MAR for exact dosages):    Anesthesia method:  Nerve block and local infiltration   Local anesthetic:  Lidocaine 2% WITH epi   Block needle gauge:  27 G   Block anesthetic:  Lidocaine 2% WITH epi   Block injection procedure:  Anatomic landmarks identified and anatomic landmarks palpated   Block outcome:  Incomplete block Laceration details:    Location:  Finger   Finger location:  R thumb   Length (cm):  2.8 Repair type:    Repair type:  Simple Pre-procedure details:    Preparation:  Patient was prepped and draped in usual sterile fashion Exploration:    Hemostasis achieved with:  Epinephrine and direct pressure   Wound exploration: entire depth of wound probed and visualized     Contaminated: no   Treatment:    Area cleansed with:  Saline   Amount of cleaning:  Standard   Irrigation method:  Syringe   Visualized foreign bodies/material removed: no   Skin repair:    Repair method:  Sutures   Suture size:  5-0   Suture material:  Nylon   Suture technique:  Simple interrupted   Number of sutures:  3 Approximation:    Approximation:  Close Post-procedure details:    Dressing:  Open (no dressing)   Patient tolerance of procedure:  Tolerated well, no immediate complications   (including critical care time)  Medications Ordered in ED Medications  lidocaine-EPINEPHrine (XYLOCAINE W/EPI) 2 %-1:200000 (PF) injection 10 mL (10 mLs Intradermal Given 10/30/19 0156)    ED Course  I have reviewed the triage vital signs and the nursing notes.  Pertinent labs &  imaging results that were available during my care of the patient were reviewed by me and considered in my medical decision making (see chart for details).    MDM Rules/Calculators/A&P                      39 yo F with a cc of a thumb lac.  Accidental with a knife.  Will suture at bedside.  Tetanus up-to-date.  Discharge home.  2:16 AM:  I have discussed the diagnosis/risks/treatment options with the patient and believe the pt to be eligible for discharge home to follow-up with PCP. We also discussed returning to the ED immediately if new or worsening sx occur. We discussed the sx which are most concerning (e.g., sudden worsening pain, fever, inability to tolerate by mouth) that necessitate immediate return. Medications administered to the patient during their visit and any new prescriptions provided to the patient are listed below.  Medications given during this visit Medications  lidocaine-EPINEPHrine (XYLOCAINE W/EPI) 2 %-1:200000 (PF) injection 10 mL (10 mLs Intradermal Given 10/30/19 0156)     The patient appears reasonably screen and/or stabilized for discharge and I doubt any other medical condition or other [facility] requiring further screening, evaluation, or treatment in the ED at this time prior to discharge.   Final Clinical Impression(s) / ED Diagnoses Final diagnoses:  Laceration of right thumb without foreign body without damage to nail, initial encounter    Rx / DC Orders ED Discharge Orders    None       Deno Etienne, DO 10/30/19 0216

## 2019-10-30 NOTE — Telephone Encounter (Signed)
Pt scheduled for virtual visit.  Nothing further needed.

## 2019-10-30 NOTE — ED Triage Notes (Signed)
Pt presents with laceration to right thumb from a knife. Unknown last tetanus.

## 2019-10-31 NOTE — Progress Notes (Signed)
Erroneous entry

## 2019-11-01 ENCOUNTER — Ambulatory Visit: Payer: 59 | Admitting: Adult Health

## 2019-11-01 ENCOUNTER — Other Ambulatory Visit: Payer: Self-pay

## 2019-11-01 ENCOUNTER — Encounter: Payer: Self-pay | Admitting: Adult Health

## 2019-11-01 VITALS — BP 160/100 | Temp 97.9°F | Wt 228.0 lb

## 2019-11-01 DIAGNOSIS — R600 Localized edema: Secondary | ICD-10-CM | POA: Diagnosis not present

## 2019-11-01 DIAGNOSIS — I10 Essential (primary) hypertension: Secondary | ICD-10-CM

## 2019-11-01 MED ORDER — AMLODIPINE BESYLATE 5 MG PO TABS: 5.0000 mg | ORAL_TABLET | Freq: Every day | ORAL | 0 refills | Status: DC

## 2019-11-01 NOTE — Progress Notes (Signed)
Subjective:    Patient ID: Kelsey Cameron, female    DOB: [birth year], 39 y.o.   MRN: [id number]  HPI 39 year old female who  has a past medical history of Hypertension, Macromastia, PONV (postoperative nausea and vomiting), and STD (sexually transmitted disease) (2002).  She presents to the office today for concern of elevated blood pressure readings.  She was in the emergency room 2 days ago for a laceration of her right thumb.  In the emergency room her blood pressure was 159/110.  She does report a reading outside of the emergency room at which time her blood pressure was in the 170s over 100s.  She is taking her prescribed medication of Lotensin HCTZ and atenolol she did not take this this morning.  In the past she has also been prescribed Norvasc but is not currently taking this and has not for a few years now.    Her weight is up from 215-228 over the last month.  She also reports swelling in her feet intermittently.  She does report eating a healthy diet and stays active with some form of exercise.  She has not drinking a lot of water.  He denies headaches, blurred vision, lightheadedness, or dizziness    BP Readings from Last 10 Encounters:  11/01/19 (!) 160/100  10/30/19 (!) 159/110  10/30/19 (!) 159/110  09/30/19 124/82  04/16/19 132/80  04/27/18 (!) 150/86  03/28/18 122/88  03/19/18 118/80  01/16/18 138/82  10/18/17 124/72    Wt Readings from Last 3 Encounters:  11/01/19 228 lb (103.4 kg)  10/30/19 200 lb (90.7 kg)  09/26/19 215 lb (97.5 kg)    Review of Systems See HPI   Past Medical History:  Diagnosis Date  . Hypertension   . Macromastia   . PONV (postoperative nausea and vomiting)   . STD (sexually transmitted disease) 2002   [place]    Social History   Socioeconomic History  . Marital status: Single    Spouse name: Not on file  . Number of children: Not on file  . Years of education: Not on file  . Highest education level: Not on file    Occupational History  . Not on file  Tobacco Use  . Smoking status: Former Smoker    Types: Cigarettes  . Smokeless tobacco: Never Used  . Tobacco comment: quit smoking 10/2010  Substance and Sexual Activity  . Alcohol use: No    Alcohol/week: 0.0 standard drinks  . Drug use: No  . Sexual activity: Not Currently    Birth control/protection: Condom    Comment: INTERCOUSRE AGE 35,SEXUAL PARTNERS MORE THAN 5   Other Topics Concern  . Not on file  Social History Narrative   Works at [employer]   Has one child    Single       She likes to travel, work outside in her yard.    Social Determinants of Health   Financial Resource Strain:   . Difficulty of Paying Living Expenses:   Food Insecurity:   . Worried About Charity fundraiser in the Last Year:   . Arboriculturist in the Last Year:   Transportation Needs:   . Film/video editor (Medical):   Marland Kitchen Lack of Transportation (Non-Medical):   Physical Activity:   . Days of Exercise per Week:   . Minutes of Exercise per Session:   Stress:   . Feeling of Stress :   Social Connections:   . Frequency of  Communication with Friends and Family:   . Frequency of Social Gatherings with Friends and Family:   . Attends Religious Services:   . Active Member of Clubs or Organizations:   . Attends Archivist Meetings:   Marland Kitchen Marital Status:   Intimate Partner Violence:   . Fear of Current or Ex-Partner:   . Emotionally Abused:   Marland Kitchen Physically Abused:   . Sexually Abused:     Past Surgical History:  Procedure Laterality Date  . BREAST REDUCTION SURGERY  05/17/2011   Procedure: MAMMARY REDUCTION BILATERAL (BREAST);  Surgeon: Mary A Contogiannis;  Location: [place];  Service: Plastics;  Laterality: Bilateral;  . CESAREAN SECTION  09/25/2006  . CHOLECYSTECTOMY  2009    Family History  Problem Relation Age of Onset  . Aneurysm Mother   . Hypertension Mother   . Breast cancer Mother 20  . Aneurysm Father   .  Hypertension Father   . Heart disease Father   . Breast cancer Maternal Aunt   . Breast cancer Maternal Grandmother   . Cancer Other     Allergies  Allergen Reactions  . Amoxicillin Anaphylaxis and Hives  . Penicillins Hives and Itching  . Doxycycline     Current Outpatient Medications on File Prior to Visit  Medication Sig Dispense Refill  . atenolol (TENORMIN) 25 MG tablet Take 1 tablet (25 mg total) by mouth daily. 90 tablet 0  . benazepril-hydrochlorthiazide (LOTENSIN HCT) 20-25 MG tablet Take 1 tablet by mouth daily. 90 tablet 0  . cetirizine (ZYRTEC) 5 MG tablet Take 5 mg by mouth daily.    . fluticasone (FLONASE) 50 MCG/ACT nasal spray Place 1 spray into both nostrils in the morning and at bedtime.    Marland Kitchen ibuprofen (ADVIL) 600 MG tablet Take 1 tablet (600 mg total) by mouth every 8 (eight) hours as needed. 30 tablet 1  . Multiple Vitamin (MULTIVITAMIN) capsule Take 1 capsule by mouth daily. Reported on 07/09/2015    . NONFORMULARY OR COMPOUNDED ITEM Boric acid gel caps 600 mg insert one tablet vaginally twice weekly 30 each 0  . omeprazole (PRILOSEC) 20 MG capsule Take 1 capsule (20 mg total) by mouth daily. 30 capsule 0  . albuterol (PROVENTIL HFA;VENTOLIN HFA) 108 (90 Base) MCG/ACT inhaler Inhale 2 puffs into the lungs every 6 (six) hours as needed for wheezing or shortness of breath. 1 Inhaler 0   No current facility-administered medications on file prior to visit.    BP (!) 160/100 Comment: NO MEDS  Temp 97.9 F (36.6 C)   Wt 228 lb (103.4 kg)   BMI 39.14 kg/m       Objective:   Physical Exam Vitals and nursing note reviewed.  Constitutional:      Appearance: Normal appearance.  Cardiovascular:     Rate and Rhythm: Normal rate and regular rhythm.     Pulses: Normal pulses.     Heart sounds: Normal heart sounds.  Pulmonary:     Effort: Pulmonary effort is normal.     Breath sounds: Normal breath sounds.  Musculoskeletal:     Right lower leg: No edema.      Left lower leg: No edema.  Skin:    General: Skin is warm and dry.     Capillary Refill: Capillary refill takes less than 2 seconds.  Neurological:     General: No focal deficit present.     Mental Status: She is alert and oriented to person, place, and time.  Psychiatric:        Mood and Affect: Mood normal.        Behavior: Behavior normal.        Thought Content: Thought content normal.        Judgment: Judgment normal.        Assessment & Plan:  1. Essential hypertension -We will add Norvasc back to her regimen.  She was advised to get a blood pressure cuff and start keeping a log at home.  She has to come back in a week to have sutures removed from her thumb so we will check her blood pressure then - amLODipine (NORVASC) 5 MG tablet; Take 1 tablet (5 mg total) by mouth at bedtime.  Dispense: 30 tablet; Refill: 0  2. Lower extremity edema - No lower extremity edema seen today. - encouraged to drink more water    [name], NP

## 2019-11-01 NOTE — Patient Instructions (Signed)
It was great seeing you today   Please return next Friday for suture removal and BP follow up

## 2019-11-07 ENCOUNTER — Other Ambulatory Visit: Payer: Self-pay

## 2019-11-08 ENCOUNTER — Ambulatory Visit: Payer: 59 | Admitting: Adult Health

## 2019-11-08 ENCOUNTER — Encounter: Payer: Self-pay | Admitting: Adult Health

## 2019-11-08 ENCOUNTER — Other Ambulatory Visit: Payer: Self-pay | Admitting: Adult Health

## 2019-11-08 VITALS — BP 160/110 | Temp 97.2°F | Wt 231.0 lb

## 2019-11-08 DIAGNOSIS — I10 Essential (primary) hypertension: Secondary | ICD-10-CM | POA: Diagnosis not present

## 2019-11-08 DIAGNOSIS — Z4802 Encounter for removal of sutures: Secondary | ICD-10-CM | POA: Diagnosis not present

## 2019-11-08 MED ORDER — BENAZEPRIL HCL 20 MG PO TABS: 20.0000 mg | ORAL_TABLET | Freq: Every day | ORAL | 3 refills | Status: DC

## 2019-11-08 MED FILL — BENAZEPRIL HCL 20 MG TABLET: 20 | 90 days supply | Qty: 90 | Fill #0

## 2019-11-08 NOTE — Progress Notes (Signed)
   Subjective:    Patient ID: Kelsey Cameron, female    DOB: 11-Dec-1980, 39 y.o.   MRN: [id number]  HPI    Review of Systems     Objective:   Physical Exam        Assessment & Plan:

## 2019-11-08 NOTE — Progress Notes (Signed)
Subjective:    Patient ID: Kelsey Cameron, female    DOB: 15-Feb-1981, 39 y.o.   MRN: [id number]  HPI 39 year old female who  has a past medical history of Hypertension, Macromastia, PONV (postoperative nausea and vomiting), and STD (sexually transmitted disease) (2002).  She presents to the office today for follow-up regarding essential hypertension.  When I saw her last week her blood pressure was elevated at 160/110, she had been elevated in the emergency room a few days prior when she was there for a laceration repair.  Norvasc 5 mg was added to her regimen of benazepril/hydrochlorothiazide and atenolol.  She reports that she has been monitoring her blood pressure at home intermittently over the last week and reports readings of 150s to 160s over 90s to 100s.  She continues to be asymptomatic.  Looking back at her blood pressure readings they were fairly well controlled prior to Mirena IUD being placed on 09/30/2019  She has been drinking more water, eating a low-sodium diet, and staying active.  She also needs her sutures removed from the right thumb    Review of Systems See HPI   Past Medical History:  Diagnosis Date  . Hypertension   . Macromastia   . PONV (postoperative nausea and vomiting)   . STD (sexually transmitted disease) 2002   [place]    Social History   Socioeconomic History  . Marital status: Single    Spouse name: Not on file  . Number of children: Not on file  . Years of education: Not on file  . Highest education level: Not on file  Occupational History  . Not on file  Tobacco Use  . Smoking status: Former Smoker    Types: Cigarettes  . Smokeless tobacco: Never Used  . Tobacco comment: quit smoking 10/2010  Substance and Sexual Activity  . Alcohol use: No    Alcohol/week: 0.0 standard drinks  . Drug use: No  . Sexual activity: Not Currently    Birth control/protection: Condom    Comment: INTERCOUSRE AGE 33,SEXUAL PARTNERS MORE THAN 5   Other  Topics Concern  . Not on file  Social History Narrative   Works at [employer]   Has one child    Single       She likes to travel, work outside in her yard.    Social Determinants of Health   Financial Resource Strain:   . Difficulty of Paying Living Expenses:   Food Insecurity:   . Worried About Charity fundraiser in the Last Year:   . Arboriculturist in the Last Year:   Transportation Needs:   . Film/video editor (Medical):   Marland Kitchen Lack of Transportation (Non-Medical):   Physical Activity:   . Days of Exercise per Week:   . Minutes of Exercise per Session:   Stress:   . Feeling of Stress :   Social Connections:   . Frequency of Communication with Friends and Family:   . Frequency of Social Gatherings with Friends and Family:   . Attends Religious Services:   . Active Member of Clubs or Organizations:   . Attends Archivist Meetings:   Marland Kitchen Marital Status:   Intimate Partner Violence:   . Fear of Current or Ex-Partner:   . Emotionally Abused:   Marland Kitchen Physically Abused:   . Sexually Abused:     Past Surgical History:  Procedure Laterality Date  . BREAST REDUCTION SURGERY  05/17/2011   Procedure: MAMMARY  REDUCTION BILATERAL (BREAST);  Surgeon: Mary A Contogiannis;  Location: [facility];  Service: Plastics;  Laterality: Bilateral;  . CESAREAN SECTION  09/25/2006  . CHOLECYSTECTOMY  2009    Family History  Problem Relation Age of Onset  . Aneurysm Mother   . Hypertension Mother   . Breast cancer Mother 87  . Aneurysm Father   . Hypertension Father   . Heart disease Father   . Breast cancer Maternal Aunt   . Breast cancer Maternal Grandmother   . Cancer Other     Allergies  Allergen Reactions  . Amoxicillin Anaphylaxis and Hives  . Penicillins Hives and Itching  . Doxycycline     Current Outpatient Medications on File Prior to Visit  Medication Sig Dispense Refill  . amLODipine (NORVASC) 5 MG tablet Take 1 tablet (5 mg total) by mouth at  bedtime. 30 tablet 0  . atenolol (TENORMIN) 25 MG tablet Take 1 tablet (25 mg total) by mouth daily. 90 tablet 0  . benazepril-hydrochlorthiazide (LOTENSIN HCT) 20-25 MG tablet Take 1 tablet by mouth daily. 90 tablet 0  . cetirizine (ZYRTEC) 5 MG tablet Take 5 mg by mouth daily.    . fluticasone (FLONASE) 50 MCG/ACT nasal spray Place 1 spray into both nostrils in the morning and at bedtime.    Marland Kitchen ibuprofen (ADVIL) 600 MG tablet Take 1 tablet (600 mg total) by mouth every 8 (eight) hours as needed. 30 tablet 1  . Multiple Vitamin (MULTIVITAMIN) capsule Take 1 capsule by mouth daily. Reported on 07/09/2015    . NONFORMULARY OR COMPOUNDED ITEM Boric acid gel caps 600 mg insert one tablet vaginally twice weekly 30 each 0  . omeprazole (PRILOSEC) 20 MG capsule Take 1 capsule (20 mg total) by mouth daily. 30 capsule 0  . albuterol (PROVENTIL HFA;VENTOLIN HFA) 108 (90 Base) MCG/ACT inhaler Inhale 2 puffs into the lungs every 6 (six) hours as needed for wheezing or shortness of breath. 1 Inhaler 0   No current facility-administered medications on file prior to visit.    BP (!) 160/110   Temp (!) 97.2 F (36.2 C)   Wt 231 lb (104.8 kg)   BMI 39.65 kg/m       Objective:   Physical Exam Vitals and nursing note reviewed.  Constitutional:      Appearance: Normal appearance. She is obese.  Cardiovascular:     Rate and Rhythm: Normal rate and regular rhythm.     Pulses: Normal pulses.     Heart sounds: Normal heart sounds.  Pulmonary:     Effort: Pulmonary effort is normal.     Breath sounds: Normal breath sounds.  Abdominal:     General: Abdomen is flat.     Palpations: Abdomen is soft.  Skin:    General: Skin is warm and dry.  Neurological:     General: No focal deficit present.     Mental Status: She is alert and oriented to person, place, and time.  Psychiatric:        Mood and Affect: Mood normal.        Behavior: Behavior normal.        Thought Content: Thought content normal.         Judgment: Judgment normal.       Assessment & Plan:  1. Essential hypertension -Per Hippocrates app, progesterone can cause hypertension?  Will touch base with GYN to see what information that they can give me.  In the meantime we will increase  benazepril to 40 mg total.  Check BMP, CBC, and TSH today - Basic Metabolic Panel - TSH - CBC with Differential/Platelet - benazepril (LOTENSIN) 20 MG tablet; Take 1 tablet (20 mg total) by mouth daily.  Dispense: 90 tablet; Refill: 3  2. Visit for suture removal -3 simple sutures removed from right thumb.  Patient tolerated procedure well.  Wound was well approximated and no signs of infection  [name], NP

## 2019-11-09 LAB — CBC WITH DIFFERENTIAL/PLATELET
Absolute Monocytes: 577 cells/uL (ref 200–950)
Basophils Absolute: 82 cells/uL (ref 0–200)
Basophils Relative: 0.8 %
Eosinophils Absolute: 402 cells/uL (ref 15–500)
Eosinophils Relative: 3.9 %
HCT: 42.3 % (ref 35.0–45.0)
Hemoglobin: 14 g/dL (ref 11.7–15.5)
Lymphs Abs: 2977 cells/uL (ref 850–3900)
MCH: 31.9 pg (ref 27.0–33.0)
MCHC: 33.1 g/dL (ref 32.0–36.0)
MCV: 96.4 fL (ref 80.0–100.0)
MPV: 12.5 fL (ref 7.5–12.5)
Monocytes Relative: 5.6 %
Neutro Abs: 6262 cells/uL (ref 1500–7800)
Neutrophils Relative %: 60.8 %
Platelets: 195 10*3/uL (ref 140–400)
RBC: 4.39 10*6/uL (ref 3.80–5.10)
RDW: 13.6 % (ref 11.0–15.0)
Total Lymphocyte: 28.9 %
WBC: 10.3 10*3/uL (ref 3.8–10.8)

## 2019-11-09 LAB — BASIC METABOLIC PANEL
BUN: 10 mg/dL (ref 7–25)
CO2: 29 mmol/L (ref 20–32)
Calcium: 10.2 mg/dL (ref 8.6–10.2)
Chloride: 101 mmol/L (ref 98–110)
Creat: 0.84 mg/dL (ref 0.50–1.10)
Glucose, Bld: 90 mg/dL (ref 65–99)
Potassium: 3.9 mmol/L (ref 3.5–5.3)
Sodium: 137 mmol/L (ref 135–146)

## 2019-11-09 LAB — TSH: TSH: 0.95 mIU/L

## 2019-11-11 ENCOUNTER — Encounter: Payer: Self-pay | Admitting: Adult Health

## 2019-11-12 ENCOUNTER — Telehealth: Payer: Self-pay | Admitting: Adult Health

## 2019-11-12 MED ORDER — BLOOD PRESSURE MONITOR KIT: 1.0000 | PACK | Freq: Once | 0 refills | Status: AC

## 2019-11-12 MED FILL — OMRON 3 SERIES BP MONITOR D: 1 days supply | Qty: 1 | Fill #0

## 2019-11-12 NOTE — Telephone Encounter (Signed)
Pt notified that rx was sent to [facility].  Nothing further needed.

## 2019-11-12 NOTE — Telephone Encounter (Signed)
Updated patient on her labs.  She is going to get a blood pressure cuff today and send me an updated blood pressure reading.

## 2019-11-13 ENCOUNTER — Encounter: Payer: Self-pay | Admitting: Adult Health

## 2019-11-19 MED FILL — BENAZEPRIL-HYDROCHLOROTHIAZ: 20-25 | 90 days supply | Qty: 90 | Fill #0

## 2019-12-16 DIAGNOSIS — F411 Generalized anxiety disorder: Secondary | ICD-10-CM | POA: Diagnosis not present

## 2019-12-26 MED FILL — IBUPROFEN 600 MG TABLET: 600 | 10 days supply | Qty: 30 | Fill #1

## 2020-01-02 DIAGNOSIS — F411 Generalized anxiety disorder: Secondary | ICD-10-CM | POA: Diagnosis not present

## 2020-01-16 DIAGNOSIS — F411 Generalized anxiety disorder: Secondary | ICD-10-CM | POA: Diagnosis not present

## 2020-02-11 DIAGNOSIS — F411 Generalized anxiety disorder: Secondary | ICD-10-CM | POA: Diagnosis not present

## 2020-02-26 DIAGNOSIS — F411 Generalized anxiety disorder: Secondary | ICD-10-CM | POA: Diagnosis not present

## 2020-03-10 DIAGNOSIS — F411 Generalized anxiety disorder: Secondary | ICD-10-CM | POA: Diagnosis not present

## 2020-03-25 DIAGNOSIS — F411 Generalized anxiety disorder: Secondary | ICD-10-CM | POA: Diagnosis not present

## 2020-04-09 DIAGNOSIS — F411 Generalized anxiety disorder: Secondary | ICD-10-CM | POA: Diagnosis not present

## 2020-04-21 ENCOUNTER — Telehealth (INDEPENDENT_AMBULATORY_CARE_PROVIDER_SITE_OTHER): Payer: 59 | Admitting: Adult Health

## 2020-04-21 ENCOUNTER — Encounter: Payer: Self-pay | Admitting: Adult Health

## 2020-04-21 ENCOUNTER — Other Ambulatory Visit: Payer: Self-pay | Admitting: Adult Health

## 2020-04-21 DIAGNOSIS — J0140 Acute pansinusitis, unspecified: Secondary | ICD-10-CM

## 2020-04-21 DIAGNOSIS — F411 Generalized anxiety disorder: Secondary | ICD-10-CM | POA: Diagnosis not present

## 2020-04-21 DIAGNOSIS — J209 Acute bronchitis, unspecified: Secondary | ICD-10-CM | POA: Diagnosis not present

## 2020-04-21 MED ORDER — IBUPROFEN 600 MG PO TABS: 600.0000 mg | ORAL_TABLET | Freq: Three times a day (TID) | ORAL | 3 refills | Status: DC | PRN

## 2020-04-21 MED ORDER — AZITHROMYCIN 250 MG PO TABS: ORAL_TABLET | ORAL | 0 refills | Status: DC

## 2020-04-21 MED ORDER — HYDROCODONE-HOMATROPINE 5-1.5 MG/5ML PO SYRP: 5.0000 mL | ORAL_SOLUTION | Freq: Three times a day (TID) | ORAL | 0 refills | Status: DC | PRN

## 2020-04-21 MED ORDER — FLUCONAZOLE 150 MG PO TABS: 150.0000 mg | ORAL_TABLET | Freq: Once | ORAL | 0 refills | Status: DC

## 2020-04-21 MED ORDER — PREDNISONE 10 MG PO TABS: ORAL_TABLET | ORAL | 0 refills | Status: DC

## 2020-04-21 MED FILL — predniSONE 10 MG TABS: 10 | 9 days supply | Qty: 21 | Fill #0

## 2020-04-21 MED FILL — HYDROCODONE-HOMATROPINE SOL: 5-1.5 | 8 days supply | Qty: 120 | Fill #0

## 2020-04-21 MED FILL — AZITHROMYCIN 250 MG TABS: 250 | 5 days supply | Qty: 6 | Fill #0

## 2020-04-21 MED FILL — IBUPROFEN 600 MG TABLET: 600 | 30 days supply | Qty: 90 | Fill #0

## 2020-04-21 MED FILL — FLUCONAZOLE 150 MG TABS: 150 | 1 days supply | Qty: 1 | Fill #0

## 2020-04-21 NOTE — Addendum Note (Signed)
Addended by: Apolinar Junes on: 04/21/2020 05:17 PM   Modules accepted: Orders

## 2020-04-21 NOTE — Progress Notes (Signed)
Virtual Visit via Video Note  I connected with [name] on 04/21/20 at  5:00 PM EDT by a video enabled telemedicine application and verified that I am speaking with the correct person using two identifiers.  Location patient: home Location provider:work or home office Persons participating in the virtual visit: patient, provider  I discussed the limitations of evaluation and management by telemedicine and the availability of in person appointments. The patient expressed understanding and agreed to proceed.   HPI: 39 year old female who is being evaluated today for an acute issue.  Her symptoms started 7 to 10 days ago and have been becoming progressively worse.  Symptoms include sinus pain and pressure, fatigue, sore throat, dry cough, wheezing, and shortness of breath.  She denies fevers or chills.  Reports symptoms are worse in the morning and in the evening.  She has been unable to sleep for the past few days because of constant cough.  She does have a history of sinusitis and bronchitis and feels as though the symptoms are similar   ROS: See pertinent positives and negatives per HPI.  Past Medical History:  Diagnosis Date  . Hypertension   . Macromastia   . PONV (postoperative nausea and vomiting)   . STD (sexually transmitted disease) 2002   [place]DIA    Past Surgical History:  Procedure Laterality Date  . BREAST REDUCTION SURGERY  05/17/2011   Procedure: MAMMARY REDUCTION BILATERAL (BREAST);  Surgeon: Mary A Contogiannis;  Location: [facility];  Service: Plastics;  Laterality: Bilateral;  . CESAREAN SECTION  09/25/2006  . CHOLECYSTECTOMY  2009    Family History  Problem Relation Age of Onset  . Aneurysm Mother   . Hypertension Mother   . Breast cancer Mother 12  . Aneurysm Father   . Hypertension Father   . Heart disease Father   . Breast cancer Maternal Aunt   . Breast cancer Maternal Grandmother   . Cancer Other        Current Outpatient  Medications:  .  amLODipine (NORVASC) 5 MG tablet, Take 1 tablet (5 mg total) by mouth at bedtime., Disp: 30 tablet, Rfl: 0 .  atenolol (TENORMIN) 25 MG tablet, Take 1 tablet (25 mg total) by mouth daily., Disp: 90 tablet, Rfl: 0 .  benazepril (LOTENSIN) 20 MG tablet, Take 1 tablet (20 mg total) by mouth daily., Disp: 90 tablet, Rfl: 3 .  benazepril-hydrochlorthiazide (LOTENSIN HCT) 20-25 MG tablet, Take 1 tablet by mouth daily., Disp: 90 tablet, Rfl: 0 .  cetirizine (ZYRTEC) 5 MG tablet, Take 5 mg by mouth daily., Disp: , Rfl:  .  fluticasone (FLONASE) 50 MCG/ACT nasal spray, Place 1 spray into both nostrils in the morning and at bedtime., Disp: , Rfl:  .  ibuprofen (ADVIL) 600 MG tablet, Take 1 tablet (600 mg total) by mouth every 8 (eight) hours as needed., Disp: 30 tablet, Rfl: 1 .  Multiple Vitamin (MULTIVITAMIN) capsule, Take 1 capsule by mouth daily. Reported on 07/09/2015, Disp: , Rfl:  .  NONFORMULARY OR COMPOUNDED ITEM, Boric acid gel caps 600 mg insert one tablet vaginally twice weekly, Disp: 30 each, Rfl: 0 .  omeprazole (PRILOSEC) 20 MG capsule, Take 1 capsule (20 mg total) by mouth daily., Disp: 30 capsule, Rfl: 0 .  albuterol (PROVENTIL HFA;VENTOLIN HFA) 108 (90 Base) MCG/ACT inhaler, Inhale 2 puffs into the lungs every 6 (six) hours as needed for wheezing or shortness of breath., Disp: 1 Inhaler, Rfl: 0 .  azithromycin (ZITHROMAX Z-PAK) 250 MG  tablet, Take 2 tablets on Day 1.  Then take 1 tablet daily., Disp: 6 tablet, Rfl: 0 .  fluconazole (DIFLUCAN) 150 MG tablet, Take 1 tablet (150 mg total) by mouth once for 1 dose., Disp: 1 tablet, Rfl: 0 .  HYDROcodone-homatropine (HYCODAN) 5-1.5 MG/5ML syrup, Take 5 mLs by mouth every 8 (eight) hours as needed for cough., Disp: 120 mL, Rfl: 0 .  predniSONE (DELTASONE) 10 MG tablet, 40 mg x 3 days, 20 mg x 3 days, 10 mg x 3 days, Disp: 21 tablet, Rfl: 0  EXAM:  VITALS per patient if applicable:  GENERAL: alert, oriented, appears well and in  no acute distress  HEENT: atraumatic, conjunttiva clear, no obvious abnormalities on inspection of external nose and ears.  Has hoarseness in her voice  NECK: normal movements of the head and neck  LUNGS: on inspection no signs of respiratory distress, breathing rate appears normal, no obvious gross SOB.  Wheezing noticeable especially when she coughs and exhales  CV: no obvious cyanosis  MS: moves all visible extremities without noticeable abnormality  PSYCH/NEURO: pleasant and cooperative, no obvious depression or anxiety, speech and thought processing grossly intact  ASSESSMENT AND PLAN:  Discussed the following assessment and plan:  Acute non-recurrent pansinusitis - Plan: azithromycin (ZITHROMAX Z-PAK) 250 MG tablet, fluconazole (DIFLUCAN) 150 MG tablet  Acute bronchitis, unspecified organism - Plan: azithromycin (ZITHROMAX Z-PAK) 250 MG tablet, predniSONE (DELTASONE) 10 MG tablet, HYDROcodone-homatropine (HYCODAN) 5-1.5 MG/5ML syrup     I discussed the assessment and treatment plan with the patient. The patient was provided an opportunity to ask questions and all were answered. The patient agreed with the plan and demonstrated an understanding of the instructions.   The patient was advised to call back or seek an in-person evaluation if the symptoms worsen or if the condition fails to improve as anticipated.   [name], NP

## 2020-05-06 ENCOUNTER — Encounter: Payer: Self-pay | Admitting: Adult Health

## 2020-05-07 DIAGNOSIS — F411 Generalized anxiety disorder: Secondary | ICD-10-CM | POA: Diagnosis not present

## 2020-05-18 ENCOUNTER — Other Ambulatory Visit: Payer: Self-pay | Admitting: Adult Health

## 2020-05-18 DIAGNOSIS — I10 Essential (primary) hypertension: Secondary | ICD-10-CM

## 2020-05-18 MED FILL — BENAZEPRIL-HYDROCHLOROTHIAZ: 20-25 | 90 days supply | Qty: 90 | Fill #0

## 2020-05-18 MED FILL — BENAZEPRIL HCL 20 MG TABLET: 20 | 90 days supply | Qty: 90 | Fill #1

## 2020-05-18 MED FILL — AMLODIPINE BESYLATE 5 MG TA: 5 | 90 days supply | Qty: 90 | Fill #0

## 2020-06-17 DIAGNOSIS — Z20822 Contact with and (suspected) exposure to covid-19: Secondary | ICD-10-CM | POA: Diagnosis not present

## 2020-10-19 ENCOUNTER — Telehealth: Payer: Self-pay | Admitting: Adult Health

## 2020-10-19 NOTE — Telephone Encounter (Signed)
Pt is calling in to see if she can get the medication sent to [facility] due to her checking [facility] and it will cost her anywhere from $32.00 - $38.00 and [facility] will cost her $78.00.  Pt stated that she will call her insurance company to see if she can use [facility] and go from there b/c she is needing this medication today before we close.

## 2020-10-19 NOTE — Telephone Encounter (Signed)
Patient called back and stated that received a prescription for lice and it was cream but patient stated that she was supposed to receive a prescription for shampoo. Pt is requesting a call back, please advise. CB

## 2020-10-19 NOTE — Telephone Encounter (Signed)
Called and spoke with patient, she received a prescription from the nurse triage line spoke with Debbie, and a prescription for cream for lice was sent to [facility]. Advise patient to call office with any problems.

## 2021-01-11 ENCOUNTER — Encounter: Payer: Self-pay | Admitting: Adult Health

## 2021-01-12 ENCOUNTER — Other Ambulatory Visit: Payer: Self-pay | Admitting: Adult Health

## 2021-01-12 MED ORDER — IBUPROFEN 600 MG PO TABS: 600.0000 mg | ORAL_TABLET | Freq: Three times a day (TID) | ORAL | 0 refills | Status: DC | PRN

## 2021-02-25 ENCOUNTER — Other Ambulatory Visit (HOSPITAL_COMMUNITY): Payer: Self-pay

## 2021-03-02 ENCOUNTER — Other Ambulatory Visit (HOSPITAL_COMMUNITY): Payer: Self-pay

## 2021-03-10 ENCOUNTER — Other Ambulatory Visit (HOSPITAL_COMMUNITY): Payer: Self-pay

## 2021-03-12 ENCOUNTER — Telehealth: Payer: Self-pay | Admitting: Adult Health

## 2021-03-12 NOTE — Telephone Encounter (Signed)
Pt call and stated she need a refill on amLODipine (NORVASC) 5 MG tablet ,benazepril-hydrochlorthiazide (LOTENSIN HCT) 20-25 MG tablet  and benazepril (LOTENSIN) 20 MG tablet sent to

## 2021-04-05 NOTE — Telephone Encounter (Signed)
Pt is calling back with [facility], [address] [place] Phone:  [phone]  Fax:  [phone]

## 2021-04-06 NOTE — Telephone Encounter (Signed)
Pt needs a CPE. Tried calling pt no answer will try again shortly.

## 2021-04-14 ENCOUNTER — Telehealth: Payer: Self-pay | Admitting: Adult Health

## 2021-04-14 ENCOUNTER — Encounter: Payer: Self-pay | Admitting: Adult Health

## 2021-04-14 ENCOUNTER — Other Ambulatory Visit: Payer: Self-pay | Admitting: Adult Health

## 2021-04-14 DIAGNOSIS — I10 Essential (primary) hypertension: Secondary | ICD-10-CM

## 2021-04-14 MED ORDER — BENAZEPRIL-HYDROCHLOROTHIAZIDE 20-25 MG PO TABS: 1.0000 | ORAL_TABLET | Freq: Every day | ORAL | 0 refills | Status: DC

## 2021-04-14 MED ORDER — BENAZEPRIL HCL 20 MG PO TABS: 20.0000 mg | ORAL_TABLET | Freq: Every day | ORAL | 0 refills | Status: DC

## 2021-04-14 MED ORDER — AMLODIPINE BESYLATE 5 MG PO TABS: 5.0000 mg | ORAL_TABLET | Freq: Every day | ORAL | 0 refills | Status: DC

## 2021-04-14 NOTE — Telephone Encounter (Signed)
Pt is call back about her prescription and want a call back.

## 2021-04-16 NOTE — Telephone Encounter (Signed)
Noted  

## 2021-05-07 ENCOUNTER — Encounter: Payer: Self-pay | Admitting: Adult Health

## 2021-05-07 ENCOUNTER — Ambulatory Visit (INDEPENDENT_AMBULATORY_CARE_PROVIDER_SITE_OTHER): Payer: 59 | Admitting: Adult Health

## 2021-05-07 VITALS — BP 140/98 | HR 71 | Temp 99.3°F | Ht 64.0 in | Wt 224.0 lb

## 2021-05-07 DIAGNOSIS — Z0000 Encounter for general adult medical examination without abnormal findings: Secondary | ICD-10-CM | POA: Diagnosis not present

## 2021-05-07 DIAGNOSIS — Z23 Encounter for immunization: Secondary | ICD-10-CM | POA: Diagnosis not present

## 2021-05-07 DIAGNOSIS — I10 Essential (primary) hypertension: Secondary | ICD-10-CM

## 2021-05-07 LAB — CBC WITH DIFFERENTIAL/PLATELET
Basophils Absolute: 0 10*3/uL (ref 0.0–0.1)
Basophils Relative: 0.4 % (ref 0.0–3.0)
Eosinophils Absolute: 0.1 10*3/uL (ref 0.0–0.7)
Eosinophils Relative: 1.5 % (ref 0.0–5.0)
HCT: 43.8 % (ref 36.0–46.0)
Hemoglobin: 14.8 g/dL (ref 12.0–15.0)
Lymphocytes Relative: 21 % (ref 12.0–46.0)
Lymphs Abs: 2 10*3/uL (ref 0.7–4.0)
MCHC: 33.8 g/dL (ref 30.0–36.0)
MCV: 95.2 fl (ref 78.0–100.0)
Monocytes Absolute: 0.5 10*3/uL (ref 0.1–1.0)
Monocytes Relative: 4.8 % (ref 3.0–12.0)
Neutro Abs: 6.9 10*3/uL (ref 1.4–7.7)
Neutrophils Relative %: 72.3 % (ref 43.0–77.0)
Platelets: 178 10*3/uL (ref 150.0–400.0)
RBC: 4.6 Mil/uL (ref 3.87–5.11)
RDW: 13.9 % (ref 11.5–15.5)
WBC: 9.5 10*3/uL (ref 4.0–10.5)

## 2021-05-07 LAB — COMPREHENSIVE METABOLIC PANEL
ALT: 12 U/L (ref 0–35)
AST: 17 U/L (ref 0–37)
Albumin: 4.2 g/dL (ref 3.5–5.2)
Alkaline Phosphatase: 43 U/L (ref 39–117)
BUN: 12 mg/dL (ref 6–23)
CO2: 30 mEq/L (ref 19–32)
Calcium: 9.5 mg/dL (ref 8.4–10.5)
Chloride: 101 mEq/L (ref 96–112)
Creatinine, Ser: 1.01 mg/dL (ref 0.40–1.20)
GFR: 68.66 mL/min (ref >60.00)
Glucose, Bld: 90 mg/dL (ref 70–99)
Potassium: 3.6 mEq/L (ref 3.5–5.1)
Sodium: 139 mEq/L (ref 135–145)
Total Bilirubin: 0.5 mg/dL (ref 0.2–1.2)
Total Protein: 6.7 g/dL (ref 6.0–8.3)

## 2021-05-07 LAB — TSH: TSH: 0.82 u[IU]/mL (ref 0.35–5.50)

## 2021-05-07 LAB — LIPID PANEL
Cholesterol: 144 mg/dL (ref 0–200)
HDL: 39.7 mg/dL
LDL Cholesterol: 91 mg/dL (ref 0–99)
NonHDL: 104.11
Total CHOL/HDL Ratio: 4
Triglycerides: 65 mg/dL (ref 0.0–149.0)
VLDL: 13 mg/dL (ref 0.0–40.0)

## 2021-05-07 LAB — HEMOGLOBIN A1C: Hgb A1c MFr Bld: 5.9 % (ref 4.6–6.5)

## 2021-05-07 MED ORDER — BENAZEPRIL HCL 20 MG PO TABS: 20.0000 mg | ORAL_TABLET | Freq: Every day | ORAL | 3 refills | Status: DC

## 2021-05-07 MED ORDER — BENAZEPRIL-HYDROCHLOROTHIAZIDE 20-25 MG PO TABS: 1.0000 | ORAL_TABLET | Freq: Every day | ORAL | 3 refills | Status: DC

## 2021-05-07 MED ORDER — AMLODIPINE BESYLATE 10 MG PO TABS: 10.0000 mg | ORAL_TABLET | Freq: Every day | ORAL | 3 refills | Status: DC

## 2021-05-07 NOTE — Progress Notes (Addendum)
Subjective:    Patient ID: Kelsey Cameron, female    DOB: April 07, 1981, 40 y.o.   MRN: [id number]  HPI Patient presents for yearly preventative medicine examination. She is a pleasant 40 year old female who  has a past medical history of Hypertension, Macromastia, PONV (postoperative nausea and vomiting), and STD (sexually transmitted disease) (2002).  HTN -prescribed Lotensin HCTZ 20-25 mg, Norvasc 5 mg daily, and Lotensin 20 mg daily.  She denies dizziness, lightheadedness, chest pain, or shortness of breath. She does check periodically at home and gets readings in the 140's/90's.  BP Readings from Last 3 Encounters:  05/07/21 (!) 140/98  11/08/19 (!) 160/110  11/01/19 (!) 160/100   All immunizations and health maintenance protocols were reviewed with the patient and needed orders were placed.  Appropriate screening laboratory values were ordered for the patient including screening of hyperlipidemia, renal function and hepatic function.  Medication reconciliation,  past medical history, social history, problem list and allergies were reviewed in detail with the patient  Goals were established with regard to weight loss, exercise, and  diet in compliance with medications. She is trying to eat healthy and is exercising  Wt Readings from Last 3 Encounters:  05/07/21 224 lb (101.6 kg)  11/08/19 231 lb (104.8 kg)  11/01/19 228 lb (103.4 kg)    Review of Systems  Constitutional: Negative.   HENT: Negative.    Eyes: Negative.   Respiratory: Negative.    Cardiovascular: Negative.   Gastrointestinal: Negative.   Endocrine: Negative.   Genitourinary: Negative.   Musculoskeletal: Negative.   Skin: Negative.   Allergic/Immunologic: Negative.   Neurological: Negative.   Hematological: Negative.   Psychiatric/Behavioral: Negative.     Past Medical History:  Diagnosis Date   Hypertension    Macromastia    PONV (postoperative nausea and vomiting)    STD (sexually transmitted  disease) 2002   CH[place]MYDIA    Social History   Socioeconomic History   Marital status: Single    Spouse name: Not on file   Number of children: Not on file   Years of education: Not on file   Highest education level: Not on file  Occupational History   Not on file  Tobacco Use   Smoking status: Former    Types: Cigarettes   Smokeless tobacco: Never   Tobacco comments:    quit smoking 10/2010  Vaping Use   Vaping Use: Never used  Substance and Sexual Activity   Alcohol use: No    Alcohol/week: 0.0 standard drinks   Drug use: No   Sexual activity: Not Currently    Birth control/protection: Condom    Comment: INTERCOUSRE AGE 58,SEXUAL PARTNERS MORE THAN 5   Other Topics Concern   Not on file  Social History Narrative   Works at [employer]   Has one child    Single       She likes to travel, work outside in her yard.    Social Determinants of Health   Financial Resource Strain: Not on file  Food Insecurity: Not on file  Transportation Needs: Not on file  Physical Activity: Not on file  Stress: Not on file  Social Connections: Not on file  Intimate Partner Violence: Not on file    Past Surgical History:  Procedure Laterality Date   BREAST REDUCTION SURGERY  05/17/2011   Procedure: MAMMARY REDUCTION BILATERAL (BREAST);  Surgeon: Mary A Contogiannis;  Location: [facility];  Service: Plastics;  Laterality: Bilateral;   CESAREAN  SECTION  09/25/2006   CHOLECYSTECTOMY  2009    Family History  Problem Relation Age of Onset   Aneurysm Mother    Hypertension Mother    Breast cancer Mother 55   Aneurysm Father    Hypertension Father    Heart disease Father    Breast cancer Maternal Aunt    Breast cancer Maternal Grandmother    Cancer Other     Allergies  Allergen Reactions   Amoxicillin Anaphylaxis and Hives   Penicillins Hives and Itching   Doxycycline     Current Outpatient Medications on File Prior to Visit  Medication Sig Dispense Refill    amLODipine (NORVASC) 5 MG tablet Take 1 tablet (5 mg total) by mouth at bedtime. Needs Physical Exam 30 tablet 0   atenolol (TENORMIN) 25 MG tablet Take 1 tablet (25 mg total) by mouth daily. 90 tablet 0   benazepril (LOTENSIN) 20 MG tablet Take 1 tablet (20 mg total) by mouth daily. Needs Physical Exam 30 tablet 0   benazepril-hydrochlorthiazide (LOTENSIN HCT) 20-25 MG tablet Take 1 tablet by mouth daily. Needs Physical Exam 30 tablet 0   cetirizine (ZYRTEC) 5 MG tablet Take 5 mg by mouth daily.     fluticasone (FLONASE) 50 MCG/ACT nasal spray Place 1 spray into both nostrils in the morning and at bedtime.     ibuprofen (ADVIL) 600 MG tablet Take 1 tablet (600 mg total) by mouth every 8 (eight) hours as needed. Needs office visit for further refills 90 tablet 0   Multiple Vitamin (MULTIVITAMIN) capsule Take 1 capsule by mouth daily. Reported on 07/09/2015     NONFORMULARY OR COMPOUNDED ITEM Boric acid gel caps 600 mg insert one tablet vaginally twice weekly 30 each 0   omeprazole (PRILOSEC) 20 MG capsule Take 1 capsule (20 mg total) by mouth daily. 30 capsule 0   albuterol (PROVENTIL HFA;VENTOLIN HFA) 108 (90 Base) MCG/ACT inhaler Inhale 2 puffs into the lungs every 6 (six) hours as needed for wheezing or shortness of breath. 1 Inhaler 0   No current facility-administered medications on file prior to visit.    BP (!) 140/98   Pulse 71   Temp 99.3 F (37.4 C) (Oral)   Ht 5\' 4"  (1.626 m)   Wt 224 lb (101.6 kg)   SpO2 98%   BMI 38.45 kg/m        Objective:   Physical Exam Vitals and nursing note reviewed.  Constitutional:      Appearance: Normal appearance.  Cardiovascular:     Rate and Rhythm: Normal rate and regular rhythm.     Pulses: Normal pulses.     Heart sounds: Normal heart sounds.  Pulmonary:     Effort: Pulmonary effort is normal.     Breath sounds: Normal breath sounds.  Abdominal:     General: Abdomen is flat. Bowel sounds are normal.     Palpations: Abdomen is  soft.  Musculoskeletal:        General: Normal range of motion.  Skin:    General: Skin is warm and dry.  Neurological:     General: No focal deficit present.     Mental Status: She is alert and oriented to person, place, and time.  Psychiatric:        Mood and Affect: Mood normal.        Behavior: Behavior normal.        Thought Content: Thought content normal.        Judgment: Judgment normal.  Assessment & Plan:  1. Routine general medical examination at a health care facility  - CBC with Differential/Platelet; Future - Comprehensive metabolic panel; Future - Hemoglobin A1c; Future - Lipid panel; Future - TSH; Future  2. Essential hypertension - Will increase Norvasc from 5 mg to 10 mg  - Follow up in one month  - CBC with Differential/Platelet; Future - Comprehensive metabolic panel; Future - Hemoglobin A1c; Future - Lipid panel; Future - TSH; Future - amLODipine (NORVASC) 10 MG tablet; Take 1 tablet (10 mg total) by mouth at bedtime.  Dispense: 90 tablet; Refill: 3 - benazepril (LOTENSIN) 20 MG tablet; Take 1 tablet (20 mg total) by mouth daily.  Dispense: 90 tablet; Refill: 3 - benazepril-hydrochlorthiazide (LOTENSIN HCT) 20-25 MG tablet; Take 1 tablet by mouth daily.  Dispense: 90 tablet; Refill: 3  3. Need for immunization against influenza  - Flu Vaccine QUAD 9mo+IM (Fluarix, Fluzone & Alfiuria Quad PF)   [name], NP

## 2021-05-07 NOTE — Patient Instructions (Signed)
It was great seeing you today   We will follow up with you regarding your blood work   I have increased your Norvasc from 5 mg to 10 mg. You can take 2 tabs of what you have at home   Please follow up in one month for recheck

## 2021-05-09 ENCOUNTER — Other Ambulatory Visit: Payer: Self-pay | Admitting: Adult Health

## 2021-05-09 DIAGNOSIS — I10 Essential (primary) hypertension: Secondary | ICD-10-CM

## 2021-06-04 ENCOUNTER — Ambulatory Visit (INDEPENDENT_AMBULATORY_CARE_PROVIDER_SITE_OTHER): Payer: 59 | Admitting: Adult Health

## 2021-06-04 VITALS — BP 138/88 | HR 92 | Temp 98.8°F | Ht 64.0 in | Wt 225.0 lb

## 2021-06-04 DIAGNOSIS — I10 Essential (primary) hypertension: Secondary | ICD-10-CM | POA: Diagnosis not present

## 2021-06-04 NOTE — Progress Notes (Signed)
Subjective:    Patient ID: Kelsey Cameron, female    DOB: March 17, 1981, 40 y.o.   MRN: [id number]  HPI 40 year old female who  has a past medical history of Hypertension, Macromastia, PONV (postoperative nausea and vomiting), and STD (sexually transmitted disease) (2002).  She presents to the office today for 1 month follow-up regarding hypertension.  Currently prescribed Lotensin HCTZ 20-25 mg, Lotensin 20 mg daily, and Norvasc 10 mg.  During her CPE 1 month ago Norvasc was increased from 5 mg to 10 mg for better blood pressure control. Today she reports that she has been getting readings in the 130-140's/80-90's at home.   BP Readings from Last 3 Encounters:  06/04/21 138/88  05/07/21 (!) 140/98  11/08/19 (!) 160/110   Review of Systems See HPI   Past Medical History:  Diagnosis Date   Hypertension    Macromastia    PONV (postoperative nausea and vomiting)    STD (sexually transmitted disease) 2002   [place]MYDIA    Social History   Socioeconomic History   Marital status: Single    Spouse name: Not on file   Number of children: Not on file   Years of education: Not on file   Highest education level: Bachelor's degree (e.g., BA, AB, BS)  Occupational History   Not on file  Tobacco Use   Smoking status: Former    Types: Cigarettes   Smokeless tobacco: Never   Tobacco comments:    quit smoking 10/2010  Vaping Use   Vaping Use: Never used  Substance and Sexual Activity   Alcohol use: No    Alcohol/week: 0.0 standard drinks   Drug use: No   Sexual activity: Not Currently    Birth control/protection: Condom    Comment: INTERCOUSRE AGE 53,SEXUAL PARTNERS MORE THAN 5   Other Topics Concern   Not on file  Social History Narrative   Works at [employer]   Has one child    Single       She likes to travel, work outside in her yard.    Social Determinants of Health   Financial Resource Strain: Low Risk    Difficulty of Paying Living Expenses: Not hard at all  Food  Insecurity: No Food Insecurity   Worried About Charity fundraiser in the Last Year: Never true   [place] in the Last Year: Never true  Transportation Needs: No Transportation Needs   Lack of Transportation (Medical): No   Lack of Transportation (Non-Medical): No  Physical Activity: Insufficiently Active   Days of Exercise per Week: 3 days   Minutes of Exercise per Session: 30 min  Stress: No Stress Concern Present   Feeling of Stress : Not at all  Social Connections: Moderately Isolated   Frequency of Communication with Friends and Family: More than three times a week   Frequency of Social Gatherings with Friends and Family: Twice a week   Attends Religious Services: More than 4 times per year   Active Member of [employer] or Organizations: No   Attends Music therapist: Not on file   Marital Status: Never married  Intimate Partner Violence: Not on file    Past Surgical History:  Procedure Laterality Date   BREAST REDUCTION SURGERY  05/17/2011   Procedure: MAMMARY REDUCTION BILATERAL (BREAST);  Surgeon: Mary A Contogiannis;  Location: [facility];  Service: Plastics;  Laterality: Bilateral;   CESAREAN SECTION  09/25/2006   CHOLECYSTECTOMY  2009  Family History  Problem Relation Age of Onset   Aneurysm Mother    Hypertension Mother    Breast cancer Mother 71   Aneurysm Father    Hypertension Father    Heart disease Father    Breast cancer Maternal Aunt    Breast cancer Maternal Grandmother    Cancer Other     Allergies  Allergen Reactions   Amoxicillin Anaphylaxis and Hives   Penicillins Hives and Itching   Doxycycline     Current Outpatient Medications on File Prior to Visit  Medication Sig Dispense Refill   amLODipine (NORVASC) 10 MG tablet Take 1 tablet (10 mg total) by mouth at bedtime. 90 tablet 3   benazepril (LOTENSIN) 20 MG tablet Take 1 tablet (20 mg total) by mouth daily. 90 tablet 3   benazepril-hydrochlorthiazide (LOTENSIN  HCT) 20-25 MG tablet Take 1 tablet by mouth daily. 90 tablet 3   cetirizine (ZYRTEC) 5 MG tablet Take 5 mg by mouth daily.     fluticasone (FLONASE) 50 MCG/ACT nasal spray Place 1 spray into both nostrils in the morning and at bedtime.     ibuprofen (ADVIL) 600 MG tablet Take 1 tablet (600 mg total) by mouth every 8 (eight) hours as needed. Needs office visit for further refills 90 tablet 0   Multiple Vitamin (MULTIVITAMIN) capsule Take 1 capsule by mouth daily. Reported on 07/09/2015     NONFORMULARY OR COMPOUNDED ITEM Boric acid gel caps 600 mg insert one tablet vaginally twice weekly 30 each 0   albuterol (PROVENTIL HFA;VENTOLIN HFA) 108 (90 Base) MCG/ACT inhaler Inhale 2 puffs into the lungs every 6 (six) hours as needed for wheezing or shortness of breath. 1 Inhaler 0   No current facility-administered medications on file prior to visit.    BP 138/88    Pulse 92    Temp 98.8 F (37.1 C) (Oral)    Ht 5\' 4"  (1.626 m)    Wt 225 lb (102.1 kg)    SpO2 98%    BMI 38.62 kg/m       Objective:   Physical Exam Vitals and nursing note reviewed.  Constitutional:      Appearance: Normal appearance.  Cardiovascular:     Rate and Rhythm: Normal rate and regular rhythm.     Pulses: Normal pulses.     Heart sounds: Normal heart sounds.  Musculoskeletal:        General: Normal range of motion.  Skin:    General: Skin is warm and dry.  Neurological:     General: No focal deficit present.     Mental Status: She is alert and oriented to person, place, and time.  Psychiatric:        Mood and Affect: Mood normal.        Behavior: Behavior normal.        Thought Content: Thought content normal.        Judgment: Judgment normal.      Assessment & Plan:  1. Essential hypertension 138/88 today. Hopefully continue to get some additional benefit in the next 2 weeks. She will continue to monitor at home and continue to work on lifestyle modifications   [name], NP

## 2021-08-17 NOTE — Progress Notes (Signed)
? ?  Kelsey Cameron 10/02/1980 [id number] ? ? ?History:  41 y.o. G2P1 presents for annual exam.Would like STI screen with pap. ? ?Gynecologic History ?Patient's last menstrual period was 08/08/2021. ?Period Cycle (Days):  (varies, mirena inserted 09/30/19) ?Contraception/Family planning: IUD 09/2019 ?Sexually active:  ?Last Pap 03/14/17. Results were: normal ?Last mammogram: never.  ? ?Obstetric History ?OB History  ?Gravida Para Term Preterm AB Living  ?2 1     1 1   ?SAB IAB Ectopic Multiple Live Births  ?1          ?  ?# Outcome Date GA Lbr Len/2nd Weight Sex Delivery Anes PTL Lv  ?2 SAB           ?1 Para           ? ? ? ?The following portions of the patient's history were reviewed and updated as appropriate: allergies, current medications, past family history, past medical history, past social history, past surgical history, and problem list. ? ?Review of Systems ?Pertinent items noted in HPI and remainder of comprehensive ROS otherwise negative.  ? ?Past medical history, past surgical history, family history and social history were all reviewed and documented in the EPIC chart. ? ? ?Exam: ? ?Vitals:  ? 08/18/21 0900  ?BP: 126/84  ?Weight: 223 lb (101.2 kg)  ?Height: 5\' 4"  (1.626 m)  ? ?Body mass index is 38.28 kg/m?. ? ?General appearance:  Normal ?Thyroid:  Symmetrical, normal in size, without palpable masses or nodularity. ?Respiratory ? Auscultation:  Clear without wheezing or rhonchi ?Cardiovascular ? Auscultation:  Regular rate, without rubs, murmurs or gallops ? Edema/varicosities:  Not grossly evident ?Abdominal ? Soft,nontender, without masses, guarding or rebound. ? Liver/spleen:  No organomegaly noted ? Hernia:  None appreciated ? Skin ? Inspection:  Grossly normal ?Breasts: Examined lying and sitting.  ? Right: Without masses, retractions, nipple discharge or axillary adenopathy. ? ? Left: Without masses, retractions, nipple discharge or axillary adenopathy. ?Genitourinary  ? Inguinal/mons:  Normal  without inguinal adenopathy ? External genitalia:  Normal appearing vulva with no masses, tenderness, or lesions ? BUS/Urethra/Skene's glands:  Normal without masses or exudate ? Vagina:  Normal appearing with normal color and discharge, no lesions ? Cervix:  Normal appearing without discharge or lesions, pap obtained ? Uterus:  Normal in size, shape and contour.  Mobile, nontender ? Adnexa/parametria:   ?  Rt: Normal in size, without masses or tenderness. ?  Lt: Normal in size, without masses or tenderness. ? Anus and perineum: Normal ?  ? ?Patient informed chaperone available to be present for breast and pelvic exam. Patient has requested no chaperone to be present. Patient has been advised what will be completed during breast and pelvic exam.  ? ?Assessment/Plan:   ?-Well woman exam ?-Pap with cotesting, gc/ct cultures and trich testing ?-Schedule Mammo ?-Sees PCP for labs ?-IUD may remain until 2029 ? ? ?Discussed SBE, colonoscopy and DEXA screening as directed/appropriate. Recommend 177mins of exercise weekly, including weight bearing exercise. Encouraged the use of seatbelts and sunscreen. ?Return in 1 year for annual or as needed.  ? ?Kerry Dory WHNP-BC 9:14 AM 08/18/2021  ?

## 2021-08-18 ENCOUNTER — Other Ambulatory Visit: Payer: Self-pay

## 2021-08-18 ENCOUNTER — Encounter: Payer: Self-pay | Admitting: Radiology

## 2021-08-18 ENCOUNTER — Ambulatory Visit (INDEPENDENT_AMBULATORY_CARE_PROVIDER_SITE_OTHER): Payer: 59 | Admitting: Radiology

## 2021-08-18 ENCOUNTER — Other Ambulatory Visit (HOSPITAL_COMMUNITY)
Admission: RE | Admit: 2021-08-18 | Discharge: 2021-08-18 | Disposition: A | Discharge status: Home / Self Care | Payer: 59 | Source: Ambulatory Visit | Attending: Radiology | Admitting: Radiology

## 2021-08-18 VITALS — BP 126/84 | Ht 64.0 in | Wt 223.0 lb

## 2021-08-18 DIAGNOSIS — Z01419 Encounter for gynecological examination (general) (routine) without abnormal findings: Secondary | ICD-10-CM

## 2021-08-19 LAB — CYTOLOGY - PAP
Chlamydia: NEGATIVE
Comment: NEGATIVE
Comment: NEGATIVE
Comment: NEGATIVE
Comment: NORMAL
Diagnosis: NEGATIVE
High risk HPV: NEGATIVE
Neisseria Gonorrhea: NEGATIVE
Trichomonas: NEGATIVE

## 2021-08-26 ENCOUNTER — Other Ambulatory Visit: Payer: Self-pay | Admitting: Radiology

## 2021-08-26 DIAGNOSIS — Z1231 Encounter for screening mammogram for malignant neoplasm of breast: Secondary | ICD-10-CM

## 2021-09-10 ENCOUNTER — Ambulatory Visit
Admission: RE | Admit: 2021-09-10 | Discharge: 2021-09-10 | Disposition: A | Discharge status: Home / Self Care | Payer: 59 | Source: Ambulatory Visit | Attending: Radiology | Admitting: Radiology

## 2021-09-10 DIAGNOSIS — Z1231 Encounter for screening mammogram for malignant neoplasm of breast: Secondary | ICD-10-CM

## 2022-01-03 ENCOUNTER — Other Ambulatory Visit: Payer: Self-pay | Admitting: Adult Health

## 2022-01-03 ENCOUNTER — Encounter: Payer: Self-pay | Admitting: Adult Health

## 2022-01-04 MED ORDER — IBUPROFEN 600 MG PO TABS
600.0000 mg | ORAL_TABLET | Freq: Three times a day (TID) | ORAL | 0 refills | Status: DC | PRN
Start: 2022-01-04 — End: 2022-08-16

## 2022-01-04 NOTE — Telephone Encounter (Signed)
Okay for refill?  

## 2022-01-27 ENCOUNTER — Encounter: Payer: Self-pay | Admitting: Adult Health

## 2022-01-27 ENCOUNTER — Telehealth (INDEPENDENT_AMBULATORY_CARE_PROVIDER_SITE_OTHER): Payer: 59 | Admitting: Adult Health

## 2022-01-27 DIAGNOSIS — J0101 Acute recurrent maxillary sinusitis: Secondary | ICD-10-CM

## 2022-01-27 MED ORDER — AZITHROMYCIN 250 MG PO TABS: ORAL_TABLET | ORAL | 0 refills | Status: AC

## 2022-01-27 MED ORDER — FLUCONAZOLE 150 MG PO TABS: 150.0000 mg | ORAL_TABLET | Freq: Once | ORAL | 1 refills | Status: AC

## 2022-01-27 NOTE — Progress Notes (Signed)
Virtual Visit via Video Note  I connected with [name] on 01/27/22 at 11:30 AM EDT by a video enabled telemedicine application and verified that I am speaking with the correct person using two identifiers.  Location patient: home Location provider:work or home office Persons participating in the virtual visit: patient, provider  I discussed the limitations of evaluation and management by telemedicine and the availability of in person appointments. The patient expressed understanding and agreed to proceed.   HPI: 41 year old female who is being evaluated today for an acute issue.  She reports that in mid June she started to develop some sinus congestion, dry cough, and sinus pressure.  She has had waxing and waning symptoms since, starting a few weeks ago her symptoms got worse with more constant sinus congestion and sinus pain and pressure.  At home she has been using her sinus rinse without resolution in symptoms.  Has not had any fevers or chills   ROS: See pertinent positives and negatives per HPI.  Past Medical History:  Diagnosis Date   Hypertension    Macromastia    PONV (postoperative nausea and vomiting)    STD (sexually transmitted disease) 2002   [place]MYDIA    Past Surgical History:  Procedure Laterality Date   BREAST REDUCTION SURGERY  05/17/2011   Procedure: MAMMARY REDUCTION BILATERAL (BREAST);  Surgeon: Mary A Contogiannis;  Location: [facility];  Service: Plastics;  Laterality: Bilateral;   CESAREAN SECTION  09/25/2006   CHOLECYSTECTOMY  06/21/2007   REDUCTION MAMMAPLASTY Bilateral 2012    Family History  Problem Relation Age of Onset   Aneurysm Mother    Hypertension Mother    Breast cancer Mother 22   Aneurysm Father    Hypertension Father    Heart disease Father    Breast cancer Maternal Aunt    Breast cancer Maternal Grandmother    Cancer Other        Current Outpatient Medications:    azithromycin (ZITHROMAX) 250 MG  tablet, Take 2 tablets on day 1, then 1 tablet daily on days 2 through 5, Disp: 6 tablet, Rfl: 0   fluconazole (DIFLUCAN) 150 MG tablet, Take 1 tablet (150 mg total) by mouth once for 1 dose., Disp: 1 tablet, Rfl: 1   albuterol (PROVENTIL HFA;VENTOLIN HFA) 108 (90 Base) MCG/ACT inhaler, Inhale 2 puffs into the lungs every 6 (six) hours as needed for wheezing or shortness of breath., Disp: 1 Inhaler, Rfl: 0   amLODipine (NORVASC) 10 MG tablet, Take 1 tablet (10 mg total) by mouth at bedtime., Disp: 90 tablet, Rfl: 3   benazepril (LOTENSIN) 20 MG tablet, Take 1 tablet (20 mg total) by mouth daily., Disp: 90 tablet, Rfl: 3   benazepril-hydrochlorthiazide (LOTENSIN HCT) 20-25 MG tablet, Take 1 tablet by mouth daily., Disp: 90 tablet, Rfl: 3   cetirizine (ZYRTEC) 5 MG tablet, Take 5 mg by mouth daily., Disp: , Rfl:    fluticasone (FLONASE) 50 MCG/ACT nasal spray, Place 1 spray into both nostrils in the morning and at bedtime., Disp: , Rfl:    ibuprofen (ADVIL) 600 MG tablet, Take 1 tablet (600 mg total) by mouth every 8 (eight) hours as needed. Needs office visit for further refills, Disp: 90 tablet, Rfl: 0   Multiple Vitamin (MULTIVITAMIN) capsule, Take 1 capsule by mouth daily. Reported on 07/09/2015, Disp: , Rfl:    NONFORMULARY OR COMPOUNDED ITEM, Boric acid gel caps 600 mg insert one tablet vaginally twice weekly, Disp: 30 each, Rfl: 0  EXAM:  VITALS per patient if applicable:  GENERAL: alert, oriented, appears well and in no acute distress  HEENT: atraumatic, conjunttiva clear, no obvious abnormalities on inspection of external nose and ears  NECK: normal movements of the head and neck  LUNGS: on inspection no signs of respiratory distress, breathing rate appears normal, no obvious gross SOB, gasping or wheezing  CV: no obvious cyanosis  MS: moves all visible extremities without noticeable abnormality  PSYCH/NEURO: pleasant and cooperative, no obvious depression or anxiety, speech and  thought processing grossly intact  ASSESSMENT AND PLAN:  Discussed the following assessment and plan:  1. Acute recurrent maxillary sinusitis -We will treat due to symptoms and duration.  She is an allergy to Doxy and amoxicillin.  Has done well with Z-Pak's in the past.  Advised to follow-up in the office if no improvement after conclusion of antibiotic therapy - azithromycin (ZITHROMAX) 250 MG tablet; Take 2 tablets on day 1, then 1 tablet daily on days 2 through 5  Dispense: 6 tablet; Refill: 0 - fluconazole (DIFLUCAN) 150 MG tablet; Take 1 tablet (150 mg total) by mouth once for 1 dose.  Dispense: 1 tablet; Refill: 1       I discussed the assessment and treatment plan with the patient. The patient was provided an opportunity to ask questions and all were answered. The patient agreed with the plan and demonstrated an understanding of the instructions.   The patient was advised to call back or seek an in-person evaluation if the symptoms worsen or if the condition fails to improve as anticipated.   [name], NP

## 2022-01-27 NOTE — Telephone Encounter (Signed)
Pt has been scheduled.  °

## 2022-02-10 ENCOUNTER — Encounter: Payer: Self-pay | Admitting: Adult Health

## 2022-02-10 NOTE — Telephone Encounter (Signed)
You saw pt 01/27/2022  DO you want me to schedule pt an ov to further assess?   1. Acute recurrent maxillary sinusitis -We will treat due to symptoms and duration.  She is an allergy to Doxy and amoxicillin.  Has done well with Z-Pak's in the past.  Advised to follow-up in the office if no improvement after conclusion of antibiotic therapy - azithromycin (ZITHROMAX) 250 MG tablet; Take 2 tablets on day 1, then 1 tablet daily on days 2 through 5  Dispense: 6 tablet; Refill: 0 - fluconazole (DIFLUCAN) 150 MG tablet; Take 1 tablet (150 mg total) by mouth once for 1 dose.  Dispense: 1 tablet; Refill:

## 2022-04-18 ENCOUNTER — Encounter: Payer: Self-pay | Admitting: Adult Health

## 2022-04-18 ENCOUNTER — Ambulatory Visit (INDEPENDENT_AMBULATORY_CARE_PROVIDER_SITE_OTHER): Payer: 59 | Admitting: Physician Assistant

## 2022-04-18 ENCOUNTER — Encounter: Payer: Self-pay | Admitting: Physician Assistant

## 2022-04-18 VITALS — BP 148/95 | HR 88 | Temp 97.7°F | Ht 64.0 in | Wt 232.8 lb

## 2022-04-18 DIAGNOSIS — Z9109 Other allergy status, other than to drugs and biological substances: Secondary | ICD-10-CM

## 2022-04-18 DIAGNOSIS — J0101 Acute recurrent maxillary sinusitis: Secondary | ICD-10-CM | POA: Diagnosis not present

## 2022-04-18 MED ORDER — LEVOFLOXACIN 500 MG PO TABS: 500.0000 mg | ORAL_TABLET | Freq: Every day | ORAL | 0 refills | Status: DC

## 2022-04-18 MED ORDER — LEVOCETIRIZINE DIHYDROCHLORIDE 5 MG PO TABS: 5.0000 mg | ORAL_TABLET | Freq: Every evening | ORAL | 2 refills | Status: DC

## 2022-04-18 MED ORDER — MONTELUKAST SODIUM 10 MG PO TABS: 10.0000 mg | ORAL_TABLET | Freq: Every day | ORAL | 2 refills | Status: DC

## 2022-04-18 MED ORDER — PREDNISONE 20 MG PO TABS: 20.0000 mg | ORAL_TABLET | Freq: Two times a day (BID) | ORAL | 0 refills | Status: AC

## 2022-04-18 MED ORDER — AZELASTINE HCL 0.1 % NA SOLN: 1.0000 | Freq: Two times a day (BID) | NASAL | 2 refills | Status: AC

## 2022-04-18 NOTE — Progress Notes (Signed)
Subjective:    Patient ID: Kelsey Cameron, female    DOB: Nov 18, 1980, 41 y.o.   MRN: [id number]  Chief Complaint  Patient presents with   Follow-up    Pt f/u for sinus infection; pt been dealing with sinus concerns since August. Pthas tried Z pack and also done nasal lavage and not feeling any better. Ucus goes from brown to green/yellow to white and repeats;     HPI Patient is in today for recurrent sinus issues. See above CC note from nurse.   Zyrtec & Flonase daily. Nasal lavage. Mucinex DM. Z-pak in August helped some.  Having to blow her nose constantly. Dry coughing. Tired of not being able to breathe through her nose.   No new pets in the house. She has 1 dog. Keeps up with housekeeping.   Past Medical History:  Diagnosis Date   Hypertension    Macromastia    PONV (postoperative nausea and vomiting)    STD (sexually transmitted disease) 2002   CHALMYDIA    Past Surgical History:  Procedure Laterality Date   BREAST REDUCTION SURGERY  05/17/2011   Procedure: MAMMARY REDUCTION BILATERAL (BREAST);  Surgeon: Mary A Contogiannis;  Location: [facility];  Service: Plastics;  Laterality: Bilateral;   CESAREAN SECTION  09/25/2006   CHOLECYSTECTOMY  06/21/2007   REDUCTION MAMMAPLASTY Bilateral 2012    Family History  Problem Relation Age of Onset   Aneurysm Mother    Hypertension Mother    Breast cancer Mother 36   Aneurysm Father    Hypertension Father    Heart disease Father    Breast cancer Maternal Aunt    Breast cancer Maternal Grandmother    Cancer Other     Social History   Tobacco Use   Smoking status: Former    Types: Cigarettes   Smokeless tobacco: Never   Tobacco comments:    quit smoking 10/2010  Vaping Use   Vaping Use: Never used  Substance Use Topics   Alcohol use: No    Alcohol/week: 0.0 standard drinks of alcohol   Drug use: No     Allergies  Allergen Reactions   Amoxicillin Anaphylaxis and Hives   Penicillins Hives  and Itching   Doxycycline Itching    Review of Systems NEGATIVE UNLESS OTHERWISE INDICATED IN HPI      Objective:     BP (!) 148/95 (BP Location: Left Arm)   Pulse 88   Temp 97.7 F (36.5 C) (Temporal)   Ht '5\' 4"'$  (1.626 m)   Wt 232 lb 12.8 oz (105.6 kg)   SpO2 98%   BMI 39.96 kg/m   Wt Readings from Last 3 Encounters:  04/18/22 232 lb 12.8 oz (105.6 kg)  08/18/21 223 lb (101.2 kg)  06/04/21 225 lb (102.1 kg)    BP Readings from Last 3 Encounters:  04/18/22 (!) 148/95  08/18/21 126/84  06/04/21 138/88     Physical Exam Vitals and nursing note reviewed.  Constitutional:      General: She is not in acute distress.    Appearance: Normal appearance. She is not ill-appearing.  HENT:     Head: Normocephalic.     Right Ear: Tympanic membrane, ear canal and external ear normal.     Left Ear: Tympanic membrane, ear canal and external ear normal.     Nose: Congestion present.     Right Turbinates: Enlarged.     Left Turbinates: Enlarged.     Right Sinus: Maxillary sinus tenderness  present.     Left Sinus: Maxillary sinus tenderness present.     Mouth/Throat:     Mouth: Mucous membranes are moist.     Pharynx: No oropharyngeal exudate or posterior oropharyngeal erythema.  Eyes:     Extraocular Movements: Extraocular movements intact.     Conjunctiva/sclera: Conjunctivae normal.     Pupils: Pupils are equal, round, and reactive to light.  Cardiovascular:     Rate and Rhythm: Normal rate and regular rhythm.     Pulses: Normal pulses.     Heart sounds: Normal heart sounds. No murmur heard. Pulmonary:     Effort: Pulmonary effort is normal. No respiratory distress.     Breath sounds: Normal breath sounds. No wheezing.  Musculoskeletal:     Cervical back: Normal range of motion.  Skin:    General: Skin is warm.  Neurological:     Mental Status: She is alert and oriented to person, place, and time.  Psychiatric:        Mood and Affect: Mood normal.        Behavior:  Behavior normal.        Assessment & Plan:  Recurrent maxillary sinusitis  Environmental allergies  Other orders -     Azelastine HCl; Place 1 spray into both nostrils 2 (two) times daily. Use in each nostril as directed  Dispense: 30 mL; Refill: 2 -     Levocetirizine Dihydrochloride; Take 1 tablet (5 mg total) by mouth every evening.  Dispense: 30 tablet; Refill: 2 -     Montelukast Sodium; Take 1 tablet (10 mg total) by mouth at bedtime.  Dispense: 30 tablet; Refill: 2 -     levoFLOXacin; Take 1 tablet (500 mg total) by mouth daily.  Dispense: 5 tablet; Refill: 0 -     predniSONE; Take 1 tablet (20 mg total) by mouth 2 (two) times daily with a meal for 5 days.  Dispense: 10 tablet; Refill: 0   Plan: Take the prednisone 20 mg twice daily with food x5 days to help with sinus inflammation relief. Take the Levaquin 500 mg once daily x5 days to help with bacterial etiology. Stop taking Zyrtec.  Switch to Xyzal instead Stop the Flonase.  Switch to Astelin nasal spray Add Singulair 10 mg at bedtime to help with additional sinus and allergy relief  If these methods do not help alleviate or improve symptoms, next steps would be consider CT sinuses and / or ENT referral.     Return in about 2 weeks (around 05/02/2022) for with your PCP for recheck symptoms.  This note was prepared with assistance of Systems analyst. Occasional wrong-word or sound-a-like substitutions may have occurred due to the inherent limitations of voice recognition software.   Davisha Linthicum M Anya Murphey, PA-C

## 2022-04-18 NOTE — Patient Instructions (Addendum)
Thanks for coming in today!   Plan: Take the prednisone 20 mg twice daily with food x5 days to help with sinus inflammation relief. Take the Levaquin 500 mg once daily x5 days to help with bacterial etiology. Stop taking Zyrtec.  Switch to Xyzal instead Stop the Flonase.  Switch to Astelin nasal spray Add Singulair 10 mg at bedtime to help with additional sinus and allergy relief  If these methods do not help alleviate or improve symptoms, next steps would be consider CT sinuses and / or ENT referral.

## 2022-05-21 ENCOUNTER — Other Ambulatory Visit: Payer: Self-pay | Admitting: Adult Health

## 2022-05-21 DIAGNOSIS — I10 Essential (primary) hypertension: Secondary | ICD-10-CM

## 2022-06-03 ENCOUNTER — Other Ambulatory Visit: Payer: Self-pay | Admitting: Adult Health

## 2022-06-03 DIAGNOSIS — I10 Essential (primary) hypertension: Secondary | ICD-10-CM

## 2022-07-04 ENCOUNTER — Encounter: Payer: Self-pay | Admitting: Family Medicine

## 2022-07-04 ENCOUNTER — Telehealth (INDEPENDENT_AMBULATORY_CARE_PROVIDER_SITE_OTHER): Payer: 59 | Admitting: Family Medicine

## 2022-07-04 VITALS — HR 82 | Temp 99.1°F | Ht 64.0 in | Wt 232.0 lb

## 2022-07-04 DIAGNOSIS — U071 COVID-19: Secondary | ICD-10-CM | POA: Diagnosis not present

## 2022-07-04 MED ORDER — NIRMATRELVIR/RITONAVIR (PAXLOVID)TABLET: 3.0000 | ORAL_TABLET | Freq: Two times a day (BID) | ORAL | 0 refills | Status: AC

## 2022-07-04 MED ORDER — HYDROCODONE BIT-HOMATROP MBR 5-1.5 MG/5ML PO SOLN: 5.0000 mL | ORAL | 0 refills | Status: DC | PRN

## 2022-07-04 NOTE — Progress Notes (Signed)
Subjective:    Patient ID: Kelsey Cameron, female    DOB: [birth year], 42 y.o.   MRN: [id number]  HPI Virtual Visit via Video Note  I connected with the patient on 07/04/22 at  2:45 PM EST by a video enabled telemedicine application and verified that I am speaking with the correct person using two identifiers.  Location patient: home Location provider:work or home office Persons participating in the virtual visit: patient, provider  I discussed the limitations of evaluation and management by telemedicine and the availability of in person appointments. The patient expressed understanding and agreed to proceed.   HPI: Here for 5 days of a dry cough and headache. No SOB. NO NVD. She tested positive yesterday for the Covid virus.    ROS: See pertinent positives and negatives per HPI.  Past Medical History:  Diagnosis Date   Hypertension    Macromastia    PONV (postoperative nausea and vomiting)    STD (sexually transmitted disease) 2002   CHALMYDIA    Past Surgical History:  Procedure Laterality Date   BREAST REDUCTION SURGERY  05/17/2011   Procedure: MAMMARY REDUCTION BILATERAL (BREAST);  Surgeon: Mary A Contogiannis;  Location: [place];  Service: Plastics;  Laterality: Bilateral;   CESAREAN SECTION  09/25/2006   CHOLECYSTECTOMY  06/21/2007   REDUCTION MAMMAPLASTY Bilateral 2012    Family History  Problem Relation Age of Onset   Aneurysm Mother    Hypertension Mother    Breast cancer Mother 28   Aneurysm Father    Hypertension Father    Heart disease Father    Breast cancer Maternal Aunt    Breast cancer Maternal Grandmother    Cancer Other      Current Outpatient Medications:    albuterol (PROVENTIL HFA;VENTOLIN HFA) 108 (90 Base) MCG/ACT inhaler, Inhale 2 puffs into the lungs every 6 (six) hours as needed for wheezing or shortness of breath., Disp: 1 Inhaler, Rfl: 0   amLODipine (NORVASC) 10 MG tablet, Take 1 tablet (10 mg total) by mouth  at bedtime., Disp: 90 tablet, Rfl: 3   azelastine (ASTELIN) 0.1 % nasal spray, Place 1 spray into both nostrils 2 (two) times daily. Use in each nostril as directed, Disp: 30 mL, Rfl: 2   benazepril (LOTENSIN) 20 MG tablet, TAKE 1 TABLET(20 MG) BY MOUTH DAILY, Disp: 30 tablet, Rfl: 0   benazepril-hydrochlorthiazide (LOTENSIN HCT) 20-25 MG tablet, TAKE 1 TABLET BY MOUTH DAILY, Disp: 90 tablet, Rfl: 0   cetirizine (ZYRTEC) 5 MG tablet, Take 5 mg by mouth daily., Disp: , Rfl:    fluticasone (FLONASE) 50 MCG/ACT nasal spray, Place 1 spray into both nostrils in the morning and at bedtime., Disp: , Rfl:    ibuprofen (ADVIL) 600 MG tablet, Take 1 tablet (600 mg total) by mouth every 8 (eight) hours as needed. Needs office visit for further refills, Disp: 90 tablet, Rfl: 0   levocetirizine (XYZAL) 5 MG tablet, Take 1 tablet (5 mg total) by mouth every evening., Disp: 30 tablet, Rfl: 2   montelukast (SINGULAIR) 10 MG tablet, Take 1 tablet (10 mg total) by mouth at bedtime., Disp: 30 tablet, Rfl: 2   Multiple Vitamin (MULTIVITAMIN) capsule, Take 1 capsule by mouth daily. Reported on 07/09/2015, Disp: , Rfl:    NONFORMULARY OR COMPOUNDED ITEM, Boric acid gel caps 600 mg insert one tablet vaginally twice weekly, Disp: 30 each, Rfl: 0  EXAM:  VITALS per patient if applicable:  GENERAL: alert, oriented, appears well and in  no acute distress  HEENT: atraumatic, conjunttiva clear, no obvious abnormalities on inspection of external nose and ears  NECK: normal movements of the head and neck  LUNGS: on inspection no signs of respiratory distress, breathing rate appears normal, no obvious gross SOB, gasping or wheezing  CV: no obvious cyanosis  MS: moves all visible extremities without noticeable abnormality  PSYCH/NEURO: pleasant and cooperative, no obvious depression or anxiety, speech and thought processing grossly intact  ASSESSMENT AND PLAN: Covid infection, treat with 5 days of Paxlovid. Alysia Penna, MD  Discussed the following assessment and plan:  No diagnosis found.     I discussed the assessment and treatment plan with the patient. The patient was provided an opportunity to ask questions and all were answered. The patient agreed with the plan and demonstrated an understanding of the instructions.   The patient was advised to call back or seek an in-person evaluation if the symptoms worsen or if the condition fails to improve as anticipated.      Review of Systems     Objective:   Physical Exam        Assessment & Plan:

## 2022-08-16 ENCOUNTER — Other Ambulatory Visit: Payer: Self-pay | Admitting: Adult Health

## 2022-08-16 ENCOUNTER — Other Ambulatory Visit: Payer: Self-pay | Admitting: Physician Assistant

## 2022-08-16 NOTE — Telephone Encounter (Signed)
Okay for refill?  

## 2022-08-26 ENCOUNTER — Ambulatory Visit (INDEPENDENT_AMBULATORY_CARE_PROVIDER_SITE_OTHER): Payer: 59 | Admitting: Radiology

## 2022-08-26 ENCOUNTER — Encounter: Payer: Self-pay | Admitting: Radiology

## 2022-08-26 VITALS — BP 128/86 | Ht 64.0 in | Wt 230.0 lb

## 2022-08-26 DIAGNOSIS — Z01419 Encounter for gynecological examination (general) (routine) without abnormal findings: Secondary | ICD-10-CM | POA: Diagnosis not present

## 2022-08-26 NOTE — Progress Notes (Signed)
   Kelsey Cameron Mar 01, 1981 [id number]   History:  42 y.o. G2P1 presents for annual exam. No gyn concerns.   Gynecologic History Patient's last menstrual period was 08/01/2022 (exact date).   Contraception/Family planning: IUD Sexually active: no Last Pap: 2023. Results were: normal Last mammogram: 2023. Results were: normal  Obstetric History OB History  Gravida Para Term Preterm AB Living  2 1     1 1   SAB IAB Ectopic Multiple Live Births  1            # Outcome Date GA Lbr Len/2nd Weight Sex Delivery Anes PTL Lv  2 SAB           1 Para              The following portions of the patient's history were reviewed and updated as appropriate: allergies, current medications, past family history, past medical history, past social history, past surgical history, and problem list.  Review of Systems Pertinent items noted in HPI and remainder of comprehensive ROS otherwise negative.   Past medical history, past surgical history, family history and social history were all reviewed and documented in the EPIC chart.   Exam:  Vitals:   08/26/22 0902  BP: 128/86  Weight: 230 lb (104.3 kg)  Height: 5\' 4"  (1.626 m)   Body mass index is 39.48 kg/m.  General appearance:  Normal, obese Thyroid:  Symmetrical, normal in size, without palpable masses or nodularity. Respiratory  Auscultation:  Clear without wheezing or rhonchi Cardiovascular  Auscultation:  Regular rate, without rubs, murmurs or gallops  Edema/varicosities:  Not grossly evident Abdominal  Soft,nontender, without masses, guarding or rebound.  Liver/spleen:  No organomegaly noted  Hernia:  None appreciated  Skin  Inspection:  Grossly normal Breasts: Examined lying and sitting. Breast reduction scars.  Right: Without masses, retractions, nipple discharge or axillary adenopathy.   Left: Without masses, retractions, nipple discharge or axillary adenopathy. Genitourinary   Inguinal/mons:  Normal without  inguinal adenopathy  External genitalia:  Normal appearing vulva with no masses, tenderness, or lesions  BUS/Urethra/Skene's glands:  Normal without masses or exudate  Vagina:  Normal appearing with normal color and discharge, no lesions  Cervix:  Normal appearing without discharge or lesions  Uterus:  Normal in size, shape and contour.  Mobile, nontender  Adnexa/parametria:     Rt: Normal in size, without masses or tenderness.   Lt: Normal in size, without masses or tenderness.  Anus and perineum: Normal   Patient informed chaperone available to be present for breast and pelvic exam. Patient has requested no chaperone to be present. Patient has been advised what will be completed during breast and pelvic exam.   Assessment/Plan:   1. Well woman exam with routine gynecological exam Pap due 2028 Declines STI screening Will scheduled this years mammogram Has PCP to manage labs     Discussed SBE, colonoscopy and DEXA screening as directed/appropriate. Recommend 137mins of exercise weekly, including weight bearing exercise. Return in 1 year for annual or as needed.   Rubbie Battiest B WHNP-BC 9:19 AM 08/26/2022

## 2022-09-16 ENCOUNTER — Encounter: Payer: Self-pay | Admitting: Physician Assistant

## 2022-09-16 ENCOUNTER — Encounter: Payer: Self-pay | Admitting: Adult Health

## 2022-09-19 ENCOUNTER — Other Ambulatory Visit: Payer: Self-pay

## 2022-09-19 IMAGING — MG MM DIGITAL SCREENING BILAT W/ TOMO AND CAD
8 series · 8 of 24 positions shown · non-contrast
Comparison: None.

CLINICAL DATA: Screening. This is the patient's initial baseline
mammogram.

EXAM:
DIGITAL SCREENING BILATERAL MAMMOGRAM WITH TOMOSYNTHESIS AND CAD
TECHNIQUE: Bilateral screening digital craniocaudal and mediolateral oblique
mammograms were obtained. Bilateral screening digital breast
tomosynthesis was performed. The images were evaluated with
computer-aided detection.

[R CC synth-2D]
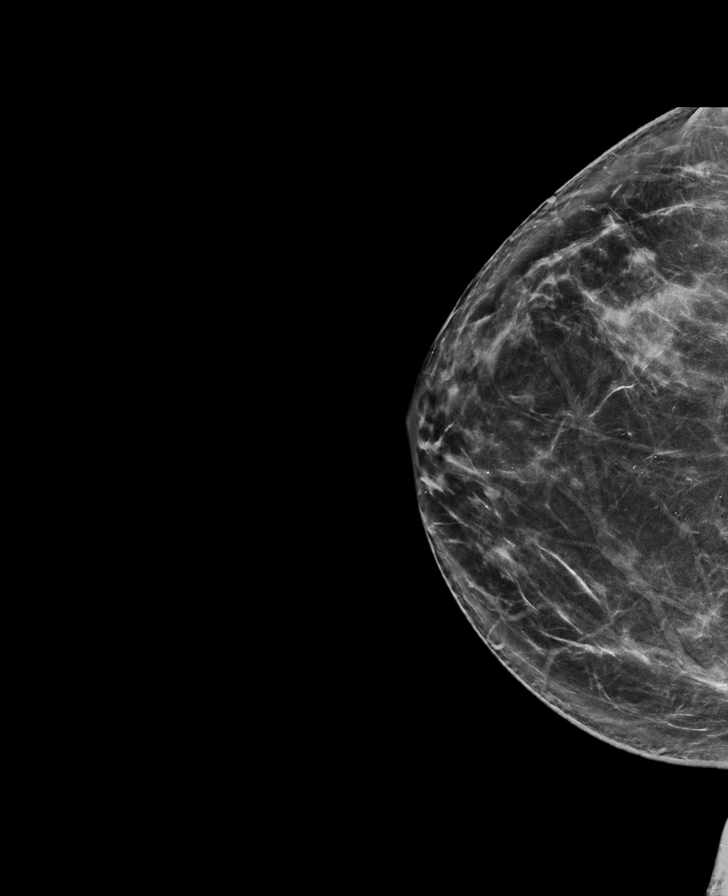

[L MLO synth-2D]
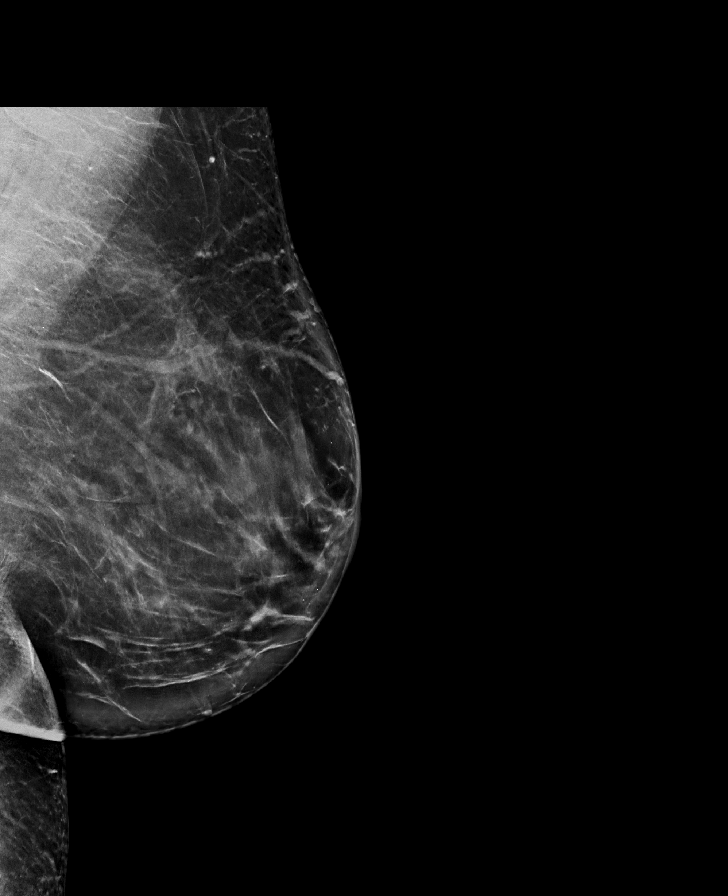

[L CC synth-2D]
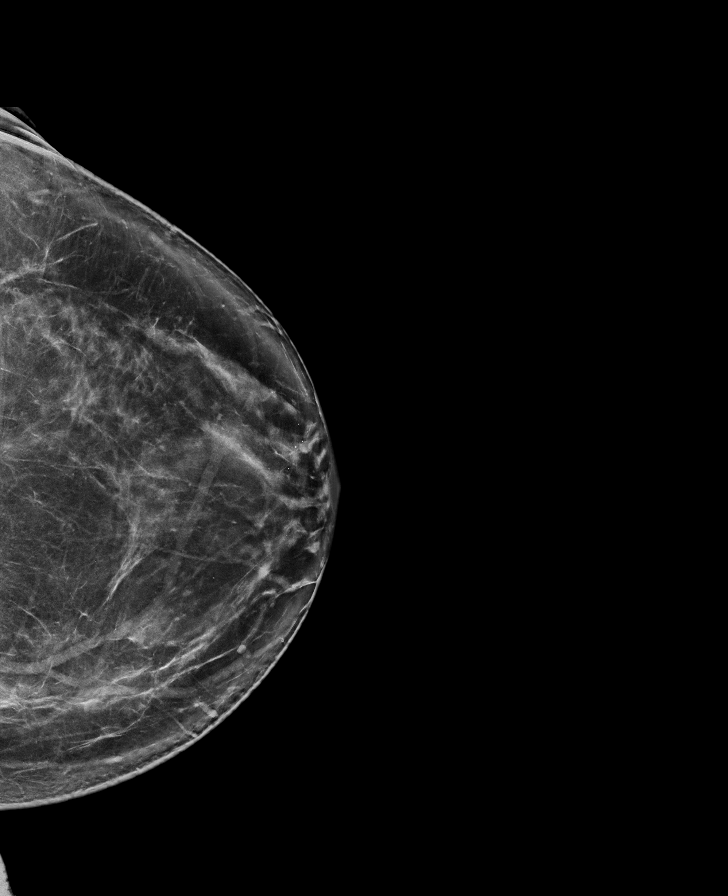

[R MLO synth-2D]
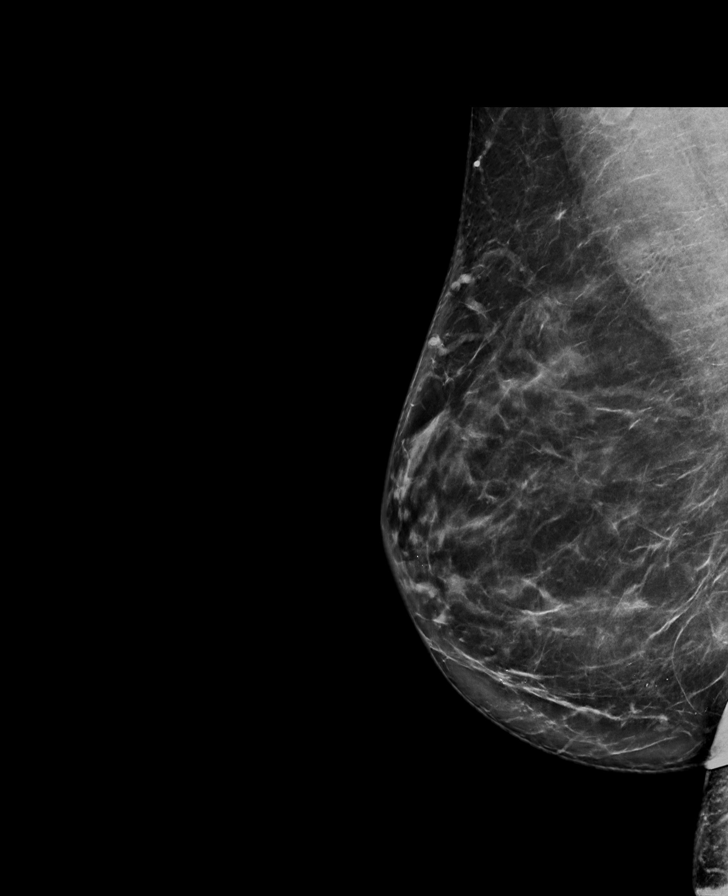

[L CC tomo · tomo slice 47/93.0]
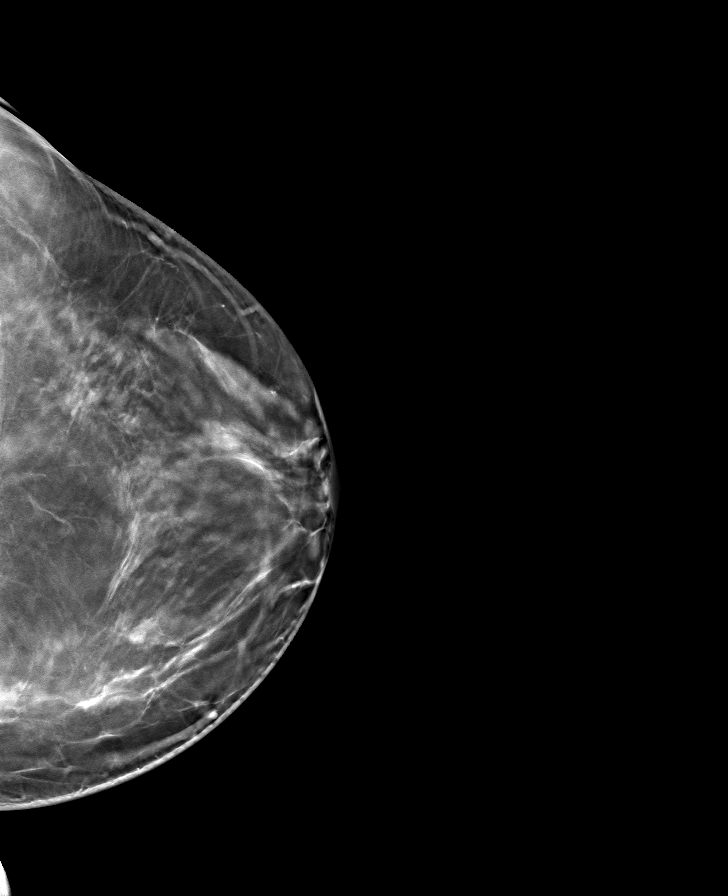

[R CC tomo · tomo slice 47/92.0]
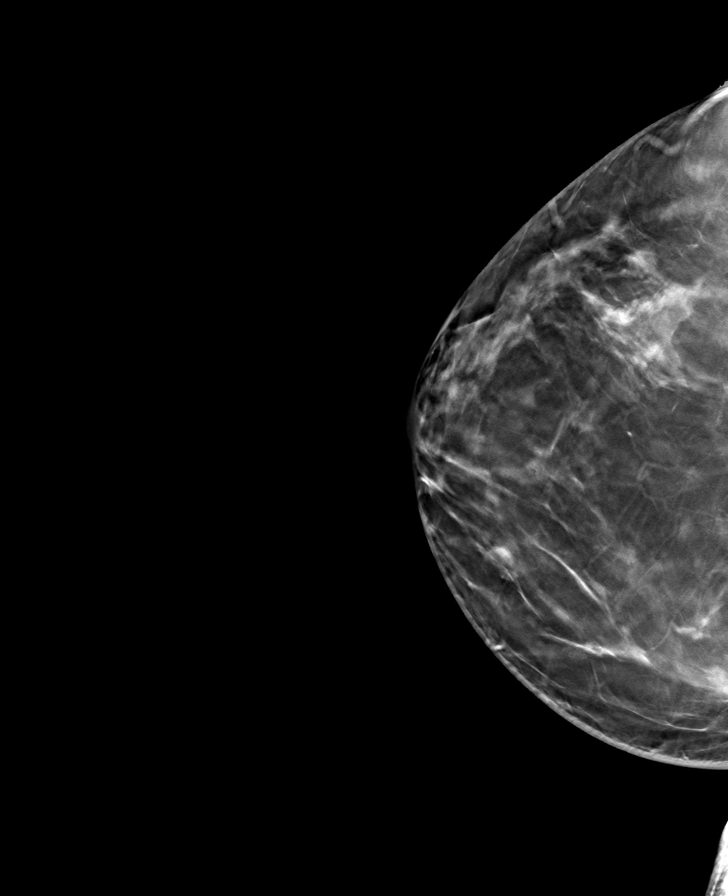

[R MLO tomo · tomo slice 51/101.0]
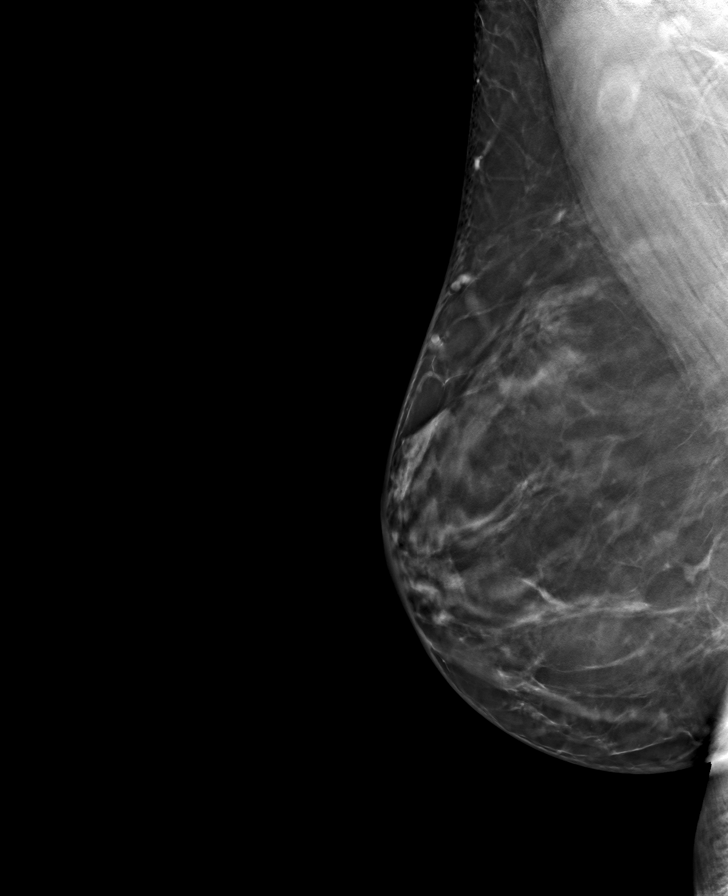

[L MLO tomo · tomo slice 53/104.0]
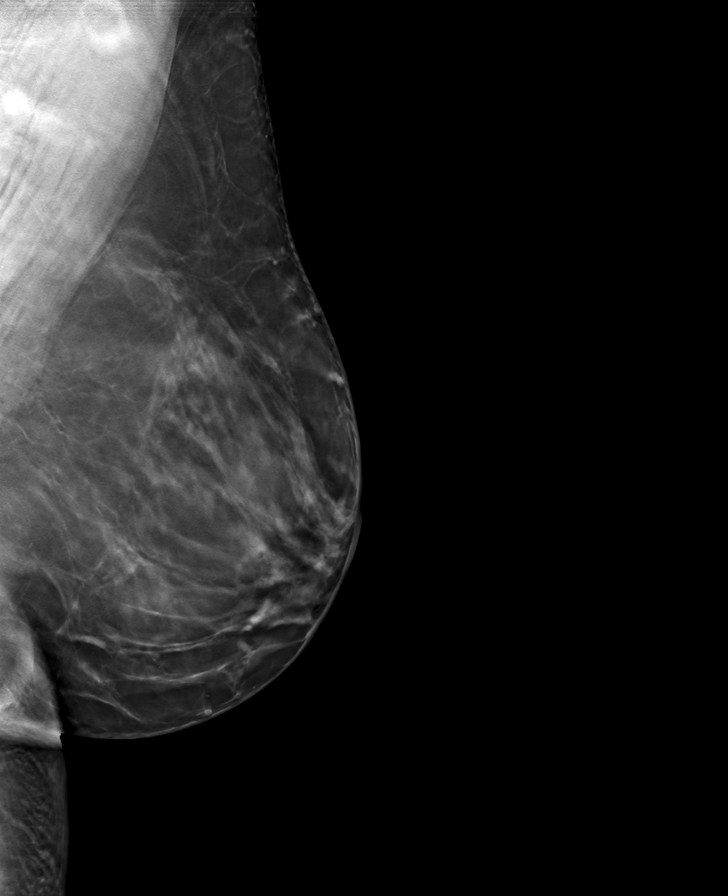

[8 of 24 positions shown; findings below may reference images not displayed]

ACR Breast Density Category b: There are scattered areas of
fibroglandular density.
FINDINGS: There are no findings suspicious for malignancy.
IMPRESSION: No mammographic evidence of malignancy. A result letter of this
screening mammogram will be mailed directly to the patient.

RECOMMENDATION:
Screening mammogram in one year. (Code:91-A-9VU)

BI-RADS CATEGORY  1: Negative.

## 2022-09-19 MED ORDER — LEVOCETIRIZINE DIHYDROCHLORIDE 5 MG PO TABS: 5.0000 mg | ORAL_TABLET | Freq: Every evening | ORAL | 2 refills | Status: DC

## 2022-09-19 MED ORDER — MONTELUKAST SODIUM 10 MG PO TABS: 10.0000 mg | ORAL_TABLET | Freq: Every day | ORAL | 2 refills | Status: DC

## 2022-09-20 NOTE — Telephone Encounter (Signed)
FYI  Looks like this was refilled yesterday

## 2022-09-25 ENCOUNTER — Other Ambulatory Visit: Payer: Self-pay | Admitting: Adult Health

## 2022-09-25 DIAGNOSIS — I10 Essential (primary) hypertension: Secondary | ICD-10-CM

## 2022-12-07 ENCOUNTER — Other Ambulatory Visit: Payer: Self-pay | Admitting: Adult Health

## 2022-12-07 DIAGNOSIS — I10 Essential (primary) hypertension: Secondary | ICD-10-CM

## 2022-12-07 NOTE — Telephone Encounter (Signed)
Spoke to pt and she was advised that a CPE is needed. Pt has been scheduled. Pt requesting short refills. Also, pt stated she is suppose to be on amlodipine do not see it on med list but see it in hx. Is pt suppose to be on medication? Please advise

## 2022-12-08 ENCOUNTER — Telehealth: Payer: Self-pay | Admitting: Adult Health

## 2022-12-08 MED ORDER — AMLODIPINE BESYLATE 5 MG PO TABS: 5.0000 mg | ORAL_TABLET | Freq: Every day | ORAL | 0 refills | Status: DC

## 2022-12-08 MED ORDER — BENAZEPRIL HCL 20 MG PO TABS: ORAL_TABLET | ORAL | 0 refills | Status: DC

## 2022-12-08 NOTE — Telephone Encounter (Signed)
Kelsey Cameron [facility] is calling and need clarification is pt suppose to be taking all 3 of bp med benazepril (LOTENSIN) 20 MG tablet , benazepril-hydrochlorthiazide (LOTENSIN HCT) 20-25 MG tablet,  amLODipine (NORVASC) 5 MG tablet  [id number] [place], [place] - [address] AT [facility] [address] [facility] [address] Phone: [phone]  Fax: [phone]

## 2022-12-08 NOTE — Telephone Encounter (Signed)
Kelsey Cameron advised that pt is to take all 3 Rx.

## 2022-12-08 NOTE — Telephone Encounter (Signed)
Rx refilled.

## 2022-12-24 ENCOUNTER — Other Ambulatory Visit: Payer: Self-pay | Admitting: Adult Health

## 2022-12-28 ENCOUNTER — Ambulatory Visit (INDEPENDENT_AMBULATORY_CARE_PROVIDER_SITE_OTHER): Payer: 59 | Admitting: Adult Health

## 2022-12-28 ENCOUNTER — Encounter: Payer: Self-pay | Admitting: Adult Health

## 2022-12-28 VITALS — BP 138/90 | HR 65 | Temp 98.3°F | Ht 64.0 in | Wt 232.0 lb

## 2022-12-28 DIAGNOSIS — I10 Essential (primary) hypertension: Secondary | ICD-10-CM | POA: Diagnosis not present

## 2022-12-28 DIAGNOSIS — Z23 Encounter for immunization: Secondary | ICD-10-CM

## 2022-12-28 DIAGNOSIS — Z0000 Encounter for general adult medical examination without abnormal findings: Secondary | ICD-10-CM | POA: Diagnosis not present

## 2022-12-28 LAB — CBC WITH DIFFERENTIAL/PLATELET
Basophils Absolute: 0.1 10*3/uL (ref 0.0–0.1)
Basophils Relative: 0.6 % (ref 0.0–3.0)
Eosinophils Absolute: 0.4 10*3/uL (ref 0.0–0.7)
Eosinophils Relative: 4.7 % (ref 0.0–5.0)
HCT: 40.8 % (ref 36.0–46.0)
Hemoglobin: 13.8 g/dL (ref 12.0–15.0)
Lymphocytes Relative: 29 % (ref 12.0–46.0)
Lymphs Abs: 2.6 10*3/uL (ref 0.7–4.0)
MCHC: 33.8 g/dL (ref 30.0–36.0)
MCV: 94.7 fl (ref 78.0–100.0)
Monocytes Absolute: 0.4 10*3/uL (ref 0.1–1.0)
Monocytes Relative: 4.2 % (ref 3.0–12.0)
Neutro Abs: 5.5 10*3/uL (ref 1.4–7.7)
Neutrophils Relative %: 61.5 % (ref 43.0–77.0)
Platelets: 208 10*3/uL (ref 150.0–400.0)
RBC: 4.31 Mil/uL (ref 3.87–5.11)
RDW: 14.7 % (ref 11.5–15.5)
WBC: 9 10*3/uL (ref 4.0–10.5)

## 2022-12-28 LAB — COMPREHENSIVE METABOLIC PANEL
ALT: 11 U/L (ref 0–35)
AST: 18 U/L (ref 0–37)
Albumin: 4.2 g/dL (ref 3.5–5.2)
Alkaline Phosphatase: 44 U/L (ref 39–117)
BUN: 11 mg/dL (ref 6–23)
CO2: 28 mEq/L (ref 19–32)
Calcium: 9.3 mg/dL (ref 8.4–10.5)
Chloride: 101 mEq/L (ref 96–112)
Creatinine, Ser: 0.98 mg/dL (ref 0.40–1.20)
GFR: 70.38 mL/min (ref >60.00)
Glucose, Bld: 90 mg/dL (ref 70–99)
Potassium: 3.6 mEq/L (ref 3.5–5.1)
Sodium: 137 mEq/L (ref 135–145)
Total Bilirubin: 0.6 mg/dL (ref 0.2–1.2)
Total Protein: 6.8 g/dL (ref 6.0–8.3)

## 2022-12-28 LAB — LIPID PANEL
Cholesterol: 163 mg/dL (ref 0–200)
HDL: 37.1 mg/dL — ABNORMAL LOW (ref >39.00)
LDL Cholesterol: 110 mg/dL — ABNORMAL HIGH (ref 0–99)
NonHDL: 126.03
Total CHOL/HDL Ratio: 4
Triglycerides: 82 mg/dL (ref 0.0–149.0)
VLDL: 16.4 mg/dL (ref 0.0–40.0)

## 2022-12-28 LAB — TSH: TSH: 1.01 u[IU]/mL (ref 0.35–5.50)

## 2022-12-28 MED ORDER — AMLODIPINE BESYLATE 5 MG PO TABS: 5.0000 mg | ORAL_TABLET | Freq: Every day | ORAL | 3 refills | Status: DC

## 2022-12-28 MED ORDER — BENAZEPRIL HCL 20 MG PO TABS: ORAL_TABLET | ORAL | 3 refills | Status: DC

## 2022-12-28 MED ORDER — BENAZEPRIL-HYDROCHLOROTHIAZIDE 20-25 MG PO TABS: 1.0000 | ORAL_TABLET | Freq: Every day | ORAL | 3 refills | Status: DC

## 2022-12-28 NOTE — Progress Notes (Signed)
Subjective:    Patient ID: Kelsey Cameron, female    DOB: 29-Oct-1980, 42 y.o.   MRN: [id number]  HPI Patient presents for yearly preventative medicine examination. She is a pleasant 42 year old female who  has a past medical history of Hypertension, Macromastia, PONV (postoperative nausea and vomiting), and STD (sexually transmitted disease) (2002).  HTN -prescribed Lotensin HCTZ 20-25 mg, Norvasc 5 mg daily, and Lotensin 20 mg daily.  She denies dizziness, lightheadedness, chest pain, or shortness of breath. She does check periodically at home and gets readings in 120-130's /80. She is fasting today  BP Readings from Last 3 Encounters:  12/28/22 (!) 138/90  08/26/22 128/86  04/18/22 (!) 148/95   All immunizations and health maintenance protocols were reviewed with the patient and needed orders were placed.  Appropriate screening laboratory values were ordered for the patient including screening of hyperlipidemia, renal function and hepatic function.   Medication reconciliation,  past medical history, social history, problem list and allergies were reviewed in detail with the patient  Goals were established with regard to weight loss, exercise, and  diet in compliance with medications. She is walking on a routine basis.  Wt Readings from Last 3 Encounters:  12/28/22 232 lb (105.2 kg)  08/26/22 230 lb (104.3 kg)  07/04/22 232 lb (105.2 kg)    Review of Systems  Constitutional: Negative.   HENT: Negative.    Eyes: Negative.   Respiratory: Negative.    Cardiovascular: Negative.   Gastrointestinal: Negative.   Endocrine: Negative.   Genitourinary: Negative.   Musculoskeletal: Negative.   Skin: Negative.   Allergic/Immunologic: Negative.   Neurological: Negative.   Hematological: Negative.   Psychiatric/Behavioral: Negative.     Past Medical History:  Diagnosis Date   Hypertension    Macromastia    PONV (postoperative nausea and vomiting)    STD (sexually transmitted  disease) 2002   CHALMYDIA    Social History   Socioeconomic History   Marital status: Single    Spouse name: Not on file   Number of children: Not on file   Years of education: Not on file   Highest education level: Bachelor's degree (e.g., BA, AB, BS)  Occupational History   Not on file  Tobacco Use   Smoking status: Former    Types: Cigarettes    Passive exposure: Never   Smokeless tobacco: Never   Tobacco comments:    quit smoking 10/2010  Vaping Use   Vaping Use: Never used  Substance and Sexual Activity   Alcohol use: No    Alcohol/week: 0.0 standard drinks of alcohol   Drug use: No   Sexual activity: Yes    Partners: Male    Birth control/protection: I.U.D.    Comment: INTERCOUSRE AGE 55,SEXUAL PARTNERS MORE THAN 5 , menarche 10yo, sexual debut 42yo  Other Topics Concern   Not on file  Social History Narrative   Works at [employer]   Has one child    Single       She likes to travel, work outside in her yard.    Social Determinants of Health   Financial Resource Strain: Low Risk  (12/27/2022)   Overall Financial Resource Strain (CARDIA)    Difficulty of Paying Living Expenses: Not hard at all  Food Insecurity: No Food Insecurity (12/27/2022)   Hunger Vital Sign    Worried About Running Out of Food in the Last Year: Never true    Ran Out of Food in the Last Year:  Never true  Transportation Needs: No Transportation Needs (12/27/2022)   PRAPARE - Administrator, Civil Service (Medical): No    Lack of Transportation (Non-Medical): No  Physical Activity: Sufficiently Active (12/27/2022)   Exercise Vital Sign    Days of Exercise per Week: 7 days    Minutes of Exercise per Session: 40 min  Stress: No Stress Concern Present (12/27/2022)   [employer] of Occupational Health - Occupational Stress Questionnaire    Feeling of Stress : Not at all  Social Connections: Moderately Isolated (12/27/2022)   Social Connection and Isolation Panel [NHANES]    Frequency of  Communication with Friends and Family: More than three times a week    Frequency of Social Gatherings with Friends and Family: Once a week    Attends Religious Services: More than 4 times per year    Active Member of [employer] or Organizations: No    Attends Engineer, structural: Not on file    Marital Status: Never married  Intimate Partner Violence: Not on file    Past Surgical History:  Procedure Laterality Date   BREAST REDUCTION SURGERY  05/17/2011   Procedure: MAMMARY REDUCTION BILATERAL (BREAST);  Surgeon: Mary A Contogiannis;  Location: [facility];  Service: Plastics;  Laterality: Bilateral;   CESAREAN SECTION  09/25/2006   CHOLECYSTECTOMY  06/21/2007   REDUCTION MAMMAPLASTY Bilateral 2012    Family History  Problem Relation Age of Onset   Aneurysm Mother    Hypertension Mother    Breast cancer Mother 20   Aneurysm Father    Hypertension Father    Heart disease Father    Breast cancer Maternal Aunt    Breast cancer Maternal Grandmother    Cancer Other     Allergies  Allergen Reactions   Amoxicillin Anaphylaxis and Hives   Penicillins Hives and Itching   Doxycycline Itching    Current Outpatient Medications on File Prior to Visit  Medication Sig Dispense Refill   amLODipine (NORVASC) 5 MG tablet Take 1 tablet (5 mg total) by mouth at bedtime. Needs Physical Exam 30 tablet 0   azelastine (ASTELIN) 0.1 % nasal spray Place 1 spray into both nostrils 2 (two) times daily. Use in each nostril as directed 30 mL 2   benazepril (LOTENSIN) 20 MG tablet TAKE 1 TABLET(20 MG) BY MOUTH DAILY 30 tablet 0   benazepril-hydrochlorthiazide (LOTENSIN HCT) 20-25 MG tablet TAKE 1 TABLET BY MOUTH DAILY 30 tablet 0   ibuprofen (ADVIL) 600 MG tablet TAKE 1 TABLET BY MOUTH EVERY 8 HOURS AS NEEDED 90 tablet 0   levocetirizine (XYZAL) 5 MG tablet Take 1 tablet (5 mg total) by mouth every evening. 30 tablet 2   Multiple Vitamin (MULTIVITAMIN) capsule Take 1 capsule by  mouth daily. Reported on 07/09/2015     montelukast (SINGULAIR) 10 MG tablet Take 1 tablet (10 mg total) by mouth at bedtime. (Patient not taking: Reported on 12/28/2022) 30 tablet 2   No current facility-administered medications on file prior to visit.    BP (!) 138/90   Pulse 65   Temp 98.3 F (36.8 C) (Oral)   Ht 5\' 4"  (1.626 m)   Wt 232 lb (105.2 kg)   SpO2 95%   BMI 39.82 kg/m       Objective:   Physical Exam Vitals and nursing note reviewed.  Constitutional:      General: She is not in acute distress.    Appearance: Normal appearance. She is obese. She  is not ill-appearing.  HENT:     Head: Normocephalic and atraumatic.     Right Ear: Tympanic membrane, ear canal and external ear normal. There is no impacted cerumen.     Left Ear: Tympanic membrane, ear canal and external ear normal. There is no impacted cerumen.     Nose: Nose normal. No congestion or rhinorrhea.     Mouth/Throat:     Mouth: Mucous membranes are moist.     Pharynx: Oropharynx is clear.  Eyes:     Extraocular Movements: Extraocular movements intact.     Conjunctiva/sclera: Conjunctivae normal.     Pupils: Pupils are equal, round, and reactive to light.  Neck:     Vascular: No carotid bruit.  Cardiovascular:     Rate and Rhythm: Normal rate and regular rhythm.     Pulses: Normal pulses.     Heart sounds: No murmur heard.    No friction rub. No gallop.  Pulmonary:     Effort: Pulmonary effort is normal.     Breath sounds: Normal breath sounds.  Abdominal:     General: Abdomen is flat. Bowel sounds are normal. There is no distension.     Palpations: Abdomen is soft. There is no mass.     Tenderness: There is no abdominal tenderness. There is no guarding or rebound.     Hernia: No hernia is present.  Musculoskeletal:        General: Normal range of motion.     Cervical back: Normal range of motion and neck supple.  Lymphadenopathy:     Cervical: No cervical adenopathy.  Skin:    General: Skin  is warm and dry.     Capillary Refill: Capillary refill takes less than 2 seconds.  Neurological:     General: No focal deficit present.     Mental Status: She is alert and oriented to person, place, and time.  Psychiatric:        Mood and Affect: Mood normal.        Behavior: Behavior normal.        Thought Content: Thought content normal.        Judgment: Judgment normal.        Assessment & Plan:   1. Routine general medical examination at a health care facility  - CBC with Differential/Platelet; Future - Comprehensive metabolic panel; Future - Lipid panel; Future - TSH; Future  2. Essential hypertension - Controlled. No change in medication  - CBC with Differential/Platelet; Future - Comprehensive metabolic panel; Future - Lipid panel; Future - TSH; Future - amLODipine (NORVASC) 5 MG tablet; Take 1 tablet (5 mg total) by mouth at bedtime.  Dispense: 90 tablet; Refill: 3 - benazepril (LOTENSIN) 20 MG tablet; TAKE 1 TABLET(20 MG) BY MOUTH DAILY  Dispense: 90 tablet; Refill: 3 - benazepril-hydrochlorthiazide (LOTENSIN HCT) 20-25 MG tablet; Take 1 tablet by mouth daily.  Dispense: 90 tablet; Refill: 3  3. Encounter for immunization  - Tdap vaccine greater than or equal to 7yo IM  Shirline Frees, NP

## 2023-01-06 LAB — HM MAMMOGRAPHY

## 2023-01-23 ENCOUNTER — Encounter: Payer: Self-pay | Admitting: Adult Health

## 2023-02-04 ENCOUNTER — Other Ambulatory Visit: Payer: Self-pay | Admitting: Physician Assistant

## 2023-03-31 ENCOUNTER — Other Ambulatory Visit: Payer: Self-pay | Admitting: Physician Assistant

## 2023-04-28 ENCOUNTER — Other Ambulatory Visit: Payer: Self-pay | Admitting: Adult Health

## 2023-05-22 NOTE — Telephone Encounter (Signed)
Spoke with [place].   Encounter closed.

## 2023-06-27 ENCOUNTER — Ambulatory Visit (INDEPENDENT_AMBULATORY_CARE_PROVIDER_SITE_OTHER): Payer: 59 | Admitting: Internal Medicine

## 2023-06-27 VITALS — BP 120/84 | HR 82 | Temp 97.3°F | Wt 240.1 lb

## 2023-06-27 DIAGNOSIS — J069 Acute upper respiratory infection, unspecified: Secondary | ICD-10-CM

## 2023-06-27 NOTE — Progress Notes (Signed)
 Established Patient Office Visit     CC/Reason for Visit: URI symptoms  HPI: Kelsey Cameron is a 43 y.o. female who is coming in today for the above mentioned reasons.  For the past 7 days she has been having a runny nose, congestion, sinus pressure, cough productive of white sputum and hoarse voice.  No sick contacts.  No fever.  She has taken 2 home COVID tests that have been negative.   Past Medical/Surgical History: Past Medical History:  Diagnosis Date   Hypertension    Macromastia    PONV (postoperative nausea and vomiting)    STD (sexually transmitted disease) 2002   CHALMYDIA    Past Surgical History:  Procedure Laterality Date   BREAST REDUCTION SURGERY  05/17/2011   Procedure: MAMMARY REDUCTION BILATERAL (BREAST);  Surgeon: Mary A Contogiannis;  Location: [facility];  Service: Plastics;  Laterality: Bilateral;   CESAREAN SECTION  09/25/2006   CHOLECYSTECTOMY  06/21/2007   REDUCTION MAMMAPLASTY Bilateral 2012    Social History:  reports that she has quit smoking. Her smoking use included cigarettes. She has never been exposed to tobacco smoke. She has never used smokeless tobacco. She reports that she does not drink alcohol and does not use drugs.  Allergies: Allergies  Allergen Reactions   Amoxicillin  Anaphylaxis and Hives   Penicillins Hives and Itching   Doxycycline  Itching    Family History:  Family History  Problem Relation Age of Onset   Aneurysm Mother    Hypertension Mother    Breast cancer Mother 63   Aneurysm Father    Hypertension Father    Heart disease Father    Breast cancer Maternal Aunt    Breast cancer Maternal Grandmother    Cancer Other      Current Outpatient Medications:    amLODipine  (NORVASC ) 5 MG tablet, Take 1 tablet (5 mg total) by mouth at bedtime., Disp: 90 tablet, Rfl: 3   azelastine  (ASTELIN ) 0.1 % nasal spray, Place 1 spray into both nostrils 2 (two) times daily. Use in each nostril as  directed, Disp: 30 mL, Rfl: 2   benazepril  (LOTENSIN ) 20 MG tablet, TAKE 1 TABLET(20 MG) BY MOUTH DAILY, Disp: 90 tablet, Rfl: 3   benazepril -hydrochlorthiazide (LOTENSIN  HCT) 20-25 MG tablet, Take 1 tablet by mouth daily., Disp: 90 tablet, Rfl: 3   ibuprofen  (ADVIL ) 600 MG tablet, TAKE 1 TABLET BY MOUTH EVERY 8 HOURS AS NEEDED, Disp: 90 tablet, Rfl: 0   levocetirizine (XYZAL ) 5 MG tablet, TAKE 1 TABLET(5 MG) BY MOUTH EVERY EVENING, Disp: 30 tablet, Rfl: 2   Multiple Vitamin (MULTIVITAMIN) capsule, Take 1 capsule by mouth daily. Reported on 07/09/2015, Disp: , Rfl:   Review of Systems:  Negative unless indicated in HPI.   Physical Exam: Vitals:   06/27/23 1432  BP: 120/84  Pulse: 82  Temp: (!) 97.3 F (36.3 C)  TempSrc: Oral  SpO2: 98%  Weight: 240 lb 1.6 oz (108.9 kg)    Body mass index is 41.21 kg/m.   Physical Exam Vitals reviewed.  Constitutional:      Appearance: Normal appearance.  HENT:     Right Ear: Tympanic membrane, ear canal and external ear normal.     Left Ear: Tympanic membrane, ear canal and external ear normal.     Mouth/Throat:     Mouth: Mucous membranes are moist.     Pharynx: Oropharynx is clear.  Eyes:     Conjunctiva/sclera: Conjunctivae normal.  Pupils: Pupils are equal, round, and reactive to light.  Cardiovascular:     Rate and Rhythm: Normal rate and regular rhythm.  Pulmonary:     Effort: Pulmonary effort is normal.     Breath sounds: Normal breath sounds.  Neurological:     Mental Status: She is alert.      Impression and Plan:  Viral upper respiratory tract infection  -Given exam findings, PNA, pharyngitis, ear infection are not likely, hence abx have not been prescribed. -Have advised rest, fluids, OTC antihistamines, cough suppressants and mucinex. -RTC if no improvement in 10-14 days.    Time spent:22 minutes reviewing chart, interviewing and examining patient and formulating plan of care.     Tully Theophilus Andrews,  MD [facility]

## 2023-08-25 ENCOUNTER — Encounter: Payer: Self-pay | Admitting: Adult Health

## 2023-08-25 ENCOUNTER — Telehealth: Payer: Self-pay

## 2023-08-25 ENCOUNTER — Other Ambulatory Visit: Payer: Self-pay

## 2023-08-25 MED ORDER — LEVOCETIRIZINE DIHYDROCHLORIDE 5 MG PO TABS: ORAL_TABLET | ORAL | 2 refills | Status: DC

## 2023-08-25 MED ORDER — LEVOCETIRIZINE DIHYDROCHLORIDE 5 MG PO TABS
ORAL_TABLET | ORAL | 2 refills | Status: DC
Start: 2023-08-25 — End: 2023-11-14

## 2023-08-25 NOTE — Telephone Encounter (Signed)
Medication has been sent to local pharmacy.  

## 2023-08-25 NOTE — Telephone Encounter (Signed)
**Note De-identified  [place] Obfuscation** Please advise 

## 2023-08-25 NOTE — Telephone Encounter (Signed)
 Copied from CRM [id number]. Topic: Clinical - Prescription Issue >> Aug 25, 2023 10:37 AM Desma Mcgregor wrote: Reason for CRM: [facility] pharmacy called in stating that a new rx for levocetirizine (XYZAL) 5 MG tablet needs to be sent to a regular pharmacy. They are a compounding pharmacy and cannot dispense this medication.

## 2023-08-28 ENCOUNTER — Other Ambulatory Visit: Payer: Self-pay | Admitting: Adult Health

## 2023-08-29 MED ORDER — IBUPROFEN 600 MG PO TABS: 600.0000 mg | ORAL_TABLET | Freq: Three times a day (TID) | ORAL | 1 refills | Status: AC | PRN

## 2023-09-20 ENCOUNTER — Encounter: Payer: Self-pay | Admitting: Radiology

## 2023-09-20 ENCOUNTER — Ambulatory Visit (INDEPENDENT_AMBULATORY_CARE_PROVIDER_SITE_OTHER): Payer: 59 | Admitting: Radiology

## 2023-09-20 VITALS — BP 116/84 | HR 99 | Ht 63.75 in | Wt 232.0 lb

## 2023-09-20 DIAGNOSIS — Z113 Encounter for screening for infections with a predominantly sexual mode of transmission: Secondary | ICD-10-CM | POA: Diagnosis not present

## 2023-09-20 DIAGNOSIS — Z975 Presence of (intrauterine) contraceptive device: Secondary | ICD-10-CM

## 2023-09-20 DIAGNOSIS — Z1211 Encounter for screening for malignant neoplasm of colon: Secondary | ICD-10-CM

## 2023-09-20 DIAGNOSIS — Z1331 Encounter for screening for depression: Secondary | ICD-10-CM | POA: Diagnosis not present

## 2023-09-20 DIAGNOSIS — Z01419 Encounter for gynecological examination (general) (routine) without abnormal findings: Secondary | ICD-10-CM

## 2023-09-20 NOTE — Progress Notes (Signed)
 Kelsey Cameron April 08, 1981 [id number]   History:  43 y.o. G2P1 presents for annual exam.Would like STI screening today, no symptoms. Mirena IUD was placed 4/21. Has a PCP. Gets regular exercise. No gyn concerns.  Gynecologic History Patient's last menstrual period was 09/04/2023 (exact date). Period Cycle (Days): 28 Period Duration (Days): 5 Period Pattern: Regular Menstrual Flow: Light Menstrual Control: Thin pad, Tampon, Maxi pad Dysmenorrhea: None Contraception/Family planning: IUD Sexually active: yes Last Pap: 3/23. Results were: normal Last mammogram: 01/23/23. Results were: normal  Obstetric History OB History  Gravida Para Term Preterm AB Living  2 1   1 1   SAB IAB Ectopic Multiple Live Births  1        # Outcome Date GA Lbr Len/2nd Weight Sex Type Anes PTL Lv  2 SAB           1 Para                09/20/2023   10:01 AM 12/28/2022    9:54 AM 06/04/2021   10:47 AM  Depression screen PHQ 2/9  Decreased Interest 0 0 0  Down, Depressed, Hopeless 0 0 0  PHQ - 2 Score 0 0 0  Altered sleeping  0   Tired, decreased energy  0   Change in appetite  0   Feeling bad or failure about yourself   0   Trouble concentrating  0   Moving slowly or fidgety/restless  0   Suicidal thoughts  0   PHQ-9 Score  0   Difficult doing work/chores  Not difficult at all      The following portions of the patient's history were reviewed and updated as appropriate: allergies, current medications, past family history, past medical history, past social history, past surgical history, and problem list.  Review of Systems  All other systems reviewed and are negative.   Past medical history, past surgical history, family history and social history were all reviewed and documented in the EPIC chart.  Exam:  Vitals:   09/20/23 1003  BP: 116/84  Pulse: 99  SpO2: 99%  Weight: 232 lb (105.2 kg)  Height: 5' 3.75" (1.619 m)   Body mass index is 40.14 kg/m.  Physical Exam Vitals and  nursing note reviewed. Exam conducted with a chaperone present.  Constitutional:      Appearance: Normal appearance. She is normal weight.  HENT:     Head: Normocephalic and atraumatic.  Neck:     Thyroid: No thyroid mass, thyromegaly or thyroid tenderness.  Cardiovascular:     Rate and Rhythm: Regular rhythm.     Heart sounds: Normal heart sounds.  Pulmonary:     Effort: Pulmonary effort is normal.     Breath sounds: Normal breath sounds.  Chest:  Breasts:    Breasts are symmetrical.     Right: Normal. No inverted nipple, mass, nipple discharge, skin change or tenderness.     Left: Normal. No inverted nipple, mass, nipple discharge, skin change or tenderness.  Abdominal:     General: Abdomen is flat. Bowel sounds are normal.     Palpations: Abdomen is soft.  Genitourinary:    General: Normal vulva.     Vagina: Normal. No vaginal discharge, bleeding or lesions.     Cervix: Normal. No discharge or lesion.     Uterus: Normal. Not enlarged and not tender.      Adnexa: Right adnexa normal and left adnexa normal.       Right: No  mass, tenderness or fullness.         Left: No mass, tenderness or fullness.    Lymphadenopathy:     Upper Body:     Right upper body: No axillary adenopathy.     Left upper body: No axillary adenopathy.  Skin:    General: Skin is warm and dry.  Neurological:     Mental Status: She is alert and oriented to person, place, and time.  Psychiatric:        Mood and Affect: Mood normal.        Thought Content: Thought content normal.        Judgment: Judgment normal.      Raynelle Fanning, CMA present for exam  Assessment/Plan:   1. Well female exam with routine gynecological exam (Primary) Pap 2026  2. Screen for sexually transmitted diseases - SURESWAB CT/NG/T. vaginalis  3. IUD (intrauterine device) in place May remain until 09/2027  4. Screening for colon cancer At 43 ( next month ) - Cologuard     Discussed SBE, colonoscopy and pap screening as  directed/appropriate. Recommend of exercise weekly, including weight bearing exercise. Encouraged the use of seatbelts and sunscreen.  Return in about 1 year (around 09/19/2024) for Annual.  Tanda Rockers WHNP-BC 10:43 AM 09/20/2023

## 2023-09-20 NOTE — Patient Instructions (Signed)
 Preventive Care 16-45 Years Old, Female  Preventive care refers to lifestyle choices and visits with your health care provider that can promote health and wellness. Preventive care visits are also called wellness exams.  What can I expect for my preventive care visit?  Counseling  Your health care provider may ask you questions about your:  Medical history, including:  Past medical problems.  Family medical history.  Pregnancy history.  Current health, including:  Menstrual cycle.  Method of birth control.  Emotional well-being.  Home life and relationship well-being.  Sexual activity and sexual health.  Lifestyle, including:  Alcohol, nicotine or tobacco, and drug use.  Access to firearms.  Diet, exercise, and sleep habits.  Work and work Astronomer.  Sunscreen use.  Safety issues such as seatbelt and bike helmet use.  Physical exam  Your health care provider will check your:  Height and weight. These may be used to calculate your BMI (body mass index). BMI is a measurement that tells if you are at a healthy weight.  Waist circumference. This measures the distance around your waistline. This measurement also tells if you are at a healthy weight and may help predict your risk of certain diseases, such as type 2 diabetes and high blood pressure.  Heart rate and blood pressure.  Body temperature.  Skin for abnormal spots.  What immunizations do I need?    Vaccines are usually given at various ages, according to a schedule. Your health care provider will recommend vaccines for you based on your age, medical history, and lifestyle or other factors, such as travel or where you work.  What tests do I need?  Screening  Your health care provider may recommend screening tests for certain conditions. This may include:  Lipid and cholesterol levels.  Diabetes screening. This is done by checking your blood sugar (glucose) after you have not eaten for a while (fasting).  Pelvic exam and Pap test.  Hepatitis B test.  Hepatitis C  test.  HIV (human immunodeficiency virus) test.  STI (sexually transmitted infection) testing, if you are at risk.  Lung cancer screening.  Colorectal cancer screening.  Mammogram. Talk with your health care provider about when you should start having regular mammograms. This may depend on whether you have a family history of breast cancer.  BRCA-related cancer screening. This may be done if you have a family history of breast, ovarian, tubal, or peritoneal cancers.  Bone density scan. This is done to screen for osteoporosis.  Talk with your health care provider about your test results, treatment options, and if necessary, the need for more tests.  Follow these instructions at home:  Eating and drinking    Eat a diet that includes fresh fruits and vegetables, whole grains, lean protein, and low-fat dairy products.  Take vitamin and mineral supplements as recommended by your health care provider.  Do not drink alcohol if:  Your health care provider tells you not to drink.  You are pregnant, may be pregnant, or are planning to become pregnant.  If you drink alcohol:  Limit how much you have to 0-1 drink a day.  Know how much alcohol is in your drink. In the [place] one drink equals one 12 oz bottle of beer (355 mL), one 5 oz glass of wine (148 mL), or one 1 oz glass of hard liquor (44 mL).  Lifestyle  Brush your teeth every morning and night with fluoride toothpaste. Floss one time each day.  Exercise for at least  30 minutes 5 or more days each week.  Do not use any products that contain nicotine or tobacco. These products include cigarettes, chewing tobacco, and vaping devices, such as e-cigarettes. If you need help quitting, ask your health care provider.  Do not use drugs.  If you are sexually active, practice safe sex. Use a condom or other form of protection to prevent STIs.  If you do not wish to become pregnant, use a form of birth control. If you plan to become pregnant, see your health care provider for a  prepregnancy visit.  Take aspirin only as told by your health care provider. Make sure that you understand how much to take and what form to take. Work with your health care provider to find out whether it is safe and beneficial for you to take aspirin daily.  Find healthy ways to manage stress, such as:  Meditation, yoga, or listening to music.  Journaling.  Talking to a trusted person.  Spending time with friends and family.  Minimize exposure to UV radiation to reduce your risk of skin cancer.  Safety  Always wear your seat belt while driving or riding in a vehicle.  Do not drive:  If you have been drinking alcohol. Do not ride with someone who has been drinking.  When you are tired or distracted.  While texting.  If you have been using any mind-altering substances or drugs.  Wear a helmet and other protective equipment during sports activities.  If you have firearms in your house, make sure you follow all gun safety procedures.  Seek help if you have been physically or sexually abused.  What's next?  Visit your health care provider once a year for an annual wellness visit.  Ask your health care provider how often you should have your eyes and teeth checked.  Stay up to date on all vaccines.  This information is not intended to replace advice given to you by your health care provider. Make sure you discuss any questions you have with your health care provider.  Document Revised: 12/02/2020 Document Reviewed: 12/02/2020  Elsevier Patient Education  2024 [employer].

## 2023-09-21 LAB — SURESWAB CT/NG/T. VAGINALIS
C. trachomatis RNA, TMA: NOT DETECTED
N. gonorrhoeae RNA, TMA: NOT DETECTED
Trichomonas vaginalis RNA: NOT DETECTED

## 2023-11-08 ENCOUNTER — Encounter: Payer: Self-pay | Admitting: Adult Health

## 2023-11-11 ENCOUNTER — Other Ambulatory Visit: Payer: Self-pay | Admitting: Adult Health

## 2023-11-14 ENCOUNTER — Other Ambulatory Visit: Payer: Self-pay | Admitting: Adult Health

## 2023-11-17 ENCOUNTER — Encounter: Payer: Self-pay | Admitting: Physician Assistant

## 2023-11-17 ENCOUNTER — Ambulatory Visit: Admitting: Physician Assistant

## 2023-11-17 ENCOUNTER — Other Ambulatory Visit (INDEPENDENT_AMBULATORY_CARE_PROVIDER_SITE_OTHER): Payer: Self-pay

## 2023-11-17 DIAGNOSIS — M25572 Pain in left ankle and joints of left foot: Secondary | ICD-10-CM

## 2023-11-17 NOTE — Addendum Note (Signed)
 Addended by: Georgann Kim on: 11/17/2023 11:31 AM   Modules accepted: Orders

## 2023-11-17 NOTE — Progress Notes (Signed)
 Office Visit Note   Patient: Kelsey Cameron           Date of Birth: [birth year]           MRN: [id number] Visit Date: 11/17/2023              Requested by: Alto Atta, NP [address] [place],  [place] [address] PCP: Alto Atta, NP   Assessment & Plan: Visit Diagnoses:  1. Pain in left ankle and joints of left foot     Plan: 43 year old woman with a 1+ history of left posterior ankle and foot pain.  No particular history of injury.  Pain is focally over the posterior calcaneus.  X-ray demonstrates Haglund's deformity and significant spurring at the posterior insertion of the Achilles tendon.  Talked about the natural history of this.  She also does have some calcifications within the distal Achilles I have recommended a trial of physical therapy and immobilization in a boot with a heel lift.  Briefly discussed surgical dressing of this.  She could follow-up with [place] Dales or Dr. Julio Ohm in 6 weeks  Follow-Up Instructions: Return in about 6 weeks (around 12/29/2023).   Orders:  Orders Placed This Encounter  Procedures   XR Ankle 2 Views Left   No orders of the defined types were placed in this encounter.     Procedures: No procedures performed   Clinical Data: No additional findings.   Subjective: No chief complaint on file.   HPI patient is a pleasant 43 year old woman who comes in with a history of left heel and ankle pain.  This been going on about a year.  Became worse within the last month.  She has been taking ibuprofen .  If she does walking on the treadmill she has pain in the next morning.  No particular injury  Review of Systems  All other systems reviewed and are negative.    Objective: Vital Signs: There were no vitals taken for this visit.  Physical Exam Constitutional:      Appearance: Normal appearance.  Pulmonary:     Effort: Pulmonary effort is normal.  Skin:    General: Skin is warm and dry.  Neurological:     General: No focal  deficit present.     Mental Status: She is alert and oriented to person, place, and time.     Ortho Exam Examination of her left ankle no swelling no erythema she has good dorsiflexion plantarflexion eversion and inversion.  She is focally tender over the posterior heel.  Achilles is intact no defect no redness no erythema pulses are intact Specialty Comments:  No specialty comments available.  Imaging: XR Ankle 2 Views Left Result Date: 11/17/2023 Radiographs of her ankle demonstrate well-maintained alignment no evidence of any fracture.  Posterior she has large spurring at the posterior calcaneus and calcifications at the distal end of the insertion of the Achilles tendon    PMFS History: Patient Active Problem List   Diagnosis Date Noted   BV (bacterial vaginosis) 09/13/2017   Hypertension 11/17/2011   Hypertrophy of breast 05/17/2011   Abdominal pain 06/20/2007   Esophagitis 05/31/2007   Past Medical History:  Diagnosis Date   Hypertension    Macromastia    PONV (postoperative nausea and vomiting)    STD (sexually transmitted disease) 2002   CHALMYDIA    Family History  Problem Relation Age of Onset   Aneurysm Mother    Hypertension Mother    Breast cancer Mother 47  Aneurysm Father    Hypertension Father    Heart disease Father    Breast cancer Maternal Aunt    Breast cancer Maternal Grandmother    Cancer Other    Pancreatic cancer Cousin     Past Surgical History:  Procedure Laterality Date   BREAST REDUCTION SURGERY  05/17/2011   Procedure: MAMMARY REDUCTION BILATERAL (BREAST);  Surgeon: [place] Nolton A Contogiannis;  Location: [facility];  Service: Plastics;  Laterality: Bilateral;   CESAREAN SECTION  09/25/2006   CHOLECYSTECTOMY  06/21/2007   REDUCTION MAMMAPLASTY Bilateral 2012   Social History   Occupational History   Not on file  Tobacco Use   Smoking status: Former    Types: Cigarettes    Passive exposure: Never   Smokeless tobacco:  Never   Tobacco comments:    quit smoking 10/2010  Vaping Use   Vaping status: Never Used  Substance and Sexual Activity   Alcohol use: Yes    Comment: rare, once every 6 months   Drug use: No   Sexual activity: Yes    Partners: Male    Birth control/protection: I.U.D.    Comment: INTERCOUSRE AGE 76,SEXUAL PARTNERS MORE THAN 5 , menarche 10yo, sexual debut 43yo

## 2023-11-21 ENCOUNTER — Encounter: Payer: Self-pay | Admitting: Rehabilitative and Restorative Service Providers"

## 2023-11-21 ENCOUNTER — Ambulatory Visit (INDEPENDENT_AMBULATORY_CARE_PROVIDER_SITE_OTHER): Admitting: Rehabilitative and Restorative Service Providers"

## 2023-11-21 DIAGNOSIS — M25572 Pain in left ankle and joints of left foot: Secondary | ICD-10-CM

## 2023-11-21 DIAGNOSIS — R262 Difficulty in walking, not elsewhere classified: Secondary | ICD-10-CM | POA: Diagnosis not present

## 2023-11-21 DIAGNOSIS — M6281 Muscle weakness (generalized): Secondary | ICD-10-CM | POA: Diagnosis not present

## 2023-11-21 NOTE — Therapy (Signed)
 OUTPATIENT PHYSICAL THERAPY  EVALUATION   Patient Name: Kelsey Cameron MRN: [id number] DOB:1981-04-10, 43 y.o., female Today's Date: 11/21/2023  END OF SESSION:  PT End of Session - 11/21/23 1437     Visit Number 1    Number of Visits 20    Date for PT Re-Evaluation 01/30/24    Authorization Type [employer]    Progress Note Due on Visit 10    PT Start Time 1436    PT Stop Time 1510    PT Time Calculation (min) 34 min    Activity Tolerance Patient tolerated treatment well    Behavior During Therapy WFL for tasks assessed/performed             Past Medical History:  Diagnosis Date   Hypertension    Macromastia    PONV (postoperative nausea and vomiting)    STD (sexually transmitted disease) 2002   CHALMYDIA   Past Surgical History:  Procedure Laterality Date   BREAST REDUCTION SURGERY  05/17/2011   Procedure: MAMMARY REDUCTION BILATERAL (BREAST);  Surgeon: Mary A Contogiannis;  Location: [facility];  Service: Plastics;  Laterality: Bilateral;   CESAREAN SECTION  09/25/2006   CHOLECYSTECTOMY  06/21/2007   REDUCTION MAMMAPLASTY Bilateral 2012   Patient Active Problem List   Diagnosis Date Noted   BV (bacterial vaginosis) 09/13/2017   Hypertension 11/17/2011   Hypertrophy of breast 05/17/2011   Abdominal pain 06/20/2007   Esophagitis 05/31/2007    PCP: Brena Calk Np  REFERRING PROVIDER: Persons, Norma Beckers, [place]  REFERRING DIAG: [phone] (ICD-10-CM) - Pain in left ankle and joints of left foot  THERAPY DIAG:  Pain in left ankle and joints of left foot  Muscle weakness (generalized)  Difficulty in walking, not elsewhere classified  Rationale for Evaluation and Treatment: Rehabilitation  ONSET DATE: Onset slowly over last 2 years since 2023  SUBJECTIVE:   SUBJECTIVE STATEMENT: Pt indicated onset of symptoms over 2 years or so, progressively worsening.  Pt indicated WB activity was troublesome to do.  Pt indicated no trouble sleeping. Pt  indicated wearing boot from PA visit with improvement in standing.   PERTINENT HISTORY: Unremarkable    PAIN:  NPRS scale: 0/10 upon arrival at rest, at worst in last 1-2 weeks 7/10 Pain location: Posterior Lt ankle achilles insertion  Pain description: "hurt, sore, swollen." Pt indicated no specific sharp pain.  Aggravating factors: WB, standing, stairs, exercise.  Relieving factors: resting  PRECAUTIONS: Other: boot wearing (see below)  WEIGHT BEARING RESTRICTIONS: Per communication with PA on 11/21/2023:  Ambulate in the boot. Can take off for PT if needed.Doesn't have to sleep in boot  FALLS:  Has patient fallen in last 6 months? No  LIVING ENVIRONMENT: Lives in: House/apartment Stairs: Flight of stairs to bedroom Has following equipment at home: Boot   OCCUPATION: Customer service with desk based job  PLOF: Independent.  Walking in park/treadmill for exercise (3 miles a day).  Pt reported serving at church on Sundays, yard care for home.   PATIENT GOALS: Reduce pain. Get better.   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  PATIENT SURVEYS:  Patient-Specific Activity Scoring Scheme  "0" represents "unable to perform." "10" represents "able to perform at prior level. 0 1 2 3 4 5 6 7 8 9  10 (Date and Score)   Activity Eval  11/21/2023    1. Exercise  3    2. Stairs  5    3. Walking  5   4.    5.    Score 4.33    Total score = sum of the activity scores/number of activities Minimum detectable change (90%CI) for average score = 2 points Minimum detectable change (90%CI) for single activity score = 3 points  COGNITION: 11/21/2023 Overall cognitive status: [facility]    MUSCLE LENGTH: 11/21/2023 No specific testing today  POSTURE:  11/21/2023 Unremarkable   PALPATION: 11/21/2023 Mild tenderness near achilles insertion Lt posterior ankle  LOWER EXTREMITY ROM:   ROM  Right Eval 11/21/2023 Left Eval 11/21/2023  Hip flexion    Hip extension    Hip abduction    Hip adduction    Hip internal rotation    Hip external rotation    Knee flexion    Knee extension    Ankle dorsiflexion 8 0 c pain  Ankle plantarflexion 60 50 c pain  Ankle inversion 22 20  Ankle eversion 26 20   (Blank rows = not tested)  LOWER EXTREMITY MMT:  MMT Right Eval 11/21/2023 Left Eval 11/21/2023  Hip flexion    Hip extension    Hip abduction    Hip adduction    Hip internal rotation    Hip external rotation    Knee flexion    Knee extension    Ankle dorsiflexion 5/5 5/5  Ankle plantarflexion 5/5 2+/5  Ankle inversion 5/5 4/5  Ankle eversion 5/5 4/5   (Blank rows = not tested)  LOWER EXTREMITY SPECIAL TESTS:  11/21/2023 No specific testing today  FUNCTIONAL TESTS:  11/21/2023 Lt SLS:  < 5 seconds  Rt SLS: 20 seconds  GAIT: 11/21/2023 Boot on Lt LE in ambulation in clinic.                                                                                                                                                                         TODAY'S TREATMENT                                                                          DATE: 11/21/2023 Therex:    HEP instruction/performance c cues for techniques, handout provided.  Trial set performed of each for comprehension and symptom assessment.  See below for exercise list  Manual AP g4 glides to talocrural joint for DF mobility gains Lt ankle .  Performed with patient in long sitting.   PATIENT EDUCATION:  11/21/2023 Education details: HEP, POC  Person educated: Patient Education method: Explanation, Demonstration, Verbal cues, and Handouts Education comprehension: verbalized understanding, returned demonstration, and verbal cues required  HOME EXERCISE PROGRAM: Access Code: [email or web address] URL: [email or web address] Date: 11/21/2023 Prepared by: Bonna Bustard  Exercises - Long Sitting Ankle Eversion  with Resistance  - 2 x daily - 7 x weekly - 3 sets - 10 reps - Long Sitting Ankle Plantar Flexion with Resistance  - 2 x daily - 7 x weekly - 3 sets - 10 reps - Long Sitting Ankle Inversion with Resistance  - 2 x daily - 7 x weekly - 3 sets - 10 reps - Long Sitting Ankle Dorsiflexion with Anchored Resistance (Mirrored)  - 2 x daily - 7 x weekly - 3 sets - 10 reps - Seated Gastroc Stretch with Strap  - 2-3 x daily - 7 x weekly - 1 sets - 3-5 reps - 30 hold  ASSESSMENT:  CLINICAL IMPRESSION: Patient is a 43 y.o. who comes to clinic with complaints of Lt ankle /foot pain with mobility, strength and movement coordination deficits that impair their ability to perform usual daily and recreational functional activities without increase difficulty/symptoms at this time.  Patient to benefit from skilled PT services to address impairments and limitations to improve to previous level of function without restriction secondary to condition.   OBJECTIVE IMPAIRMENTS: Abnormal gait, decreased activity tolerance, decreased balance, decreased coordination, decreased endurance, decreased mobility, difficulty walking, decreased ROM, decreased strength, hypomobility, increased fascial restrictions, impaired perceived functional ability, increased muscle spasms, impaired flexibility, improper body mechanics, and pain.   ACTIVITY LIMITATIONS: carrying, lifting, bending, standing, stairs, transfers, and locomotion level  PARTICIPATION LIMITATIONS: interpersonal relationship, community activity, and yard work  PERSONAL FACTORS: Time since onset of injury/illness/exacerbation and HTN are also affecting patient's functional outcome.   REHAB POTENTIAL: Good  CLINICAL DECISION MAKING: Stable/uncomplicated  EVALUATION COMPLEXITY: Low   GOALS: Goals reviewed with patient? Yes  SHORT TERM GOALS: (target date for Short term goals are 3 weeks 12/12/2023)   1.  Patient will demonstrate independent use of home exercise  program to maintain progress from in clinic treatments.  Goal status: New  LONG TERM GOALS: (target dates for all long term goals are 10 weeks  01/30/2024 )   1. Patient will demonstrate/report pain at worst less than or equal to 2/10 to facilitate minimal limitation in daily activity secondary to pain symptoms.  Goal status: New   2. Patient will demonstrate independent use of home exercise program to facilitate ability to maintain/progress functional gains from skilled physical therapy services.  Goal status: New   3. Patient will demonstrate Patient specific functional scale avg > or = 8/10 to indicate reduced disability due to condition.   Goal status: New   4.  Patient will demonstrate Lt LE MMT 5/5 throughout to faciltiate usual transfers, stairs, squatting at [facility] for daily life.   Goal status: New   5.  Patient will demonstrate Lt ankle AROM WFL s symptoms to facilitate usual mobility in ambulation and other daily activity s limitation.  Goal status: New   6.  Patient will demonstrate independent ambulation s restriction outside of boot when released by MD/ PA. Goal status: New   7.  Patient will demonstrate ascending/descending stairs reciprocally s UE assist for community integration.   Goal Status: New     PLAN:  PT FREQUENCY: 1-2x/week  PT DURATION: 10 weeks  PLANNED INTERVENTIONS: Can include 16109- PT Re-evaluation, 97110-Therapeutic exercises, 97530- Therapeutic activity, [id number]- Neuromuscular re-education, 97535- Self  Care, 16109- Manual therapy, [phone]- Gait training, [phone]- Orthotic Fit/training, [phone]- Canalith repositioning, [id number]- Aquatic Therapy, [phone]- Electrical stimulation (unattended), [phone]- Electrical stimulation (manual), [id number] Physical performance testing, 97016- Vasopneumatic device, [id number]- Ultrasound, [id number]- Traction (mechanical), [id number]- Ionotophoresis 4mg /ml Dexamethasone , Patient/Family education, Balance training, Stair training, Taping, Dry  Needling, Joint mobilization, Joint manipulation, Spinal manipulation, Spinal mobilization, Scar mobilization, Vestibular training, Visual/preceptual remediation/compensation, DME instructions, Cryotherapy, and Moist heat.  All performed as medically necessary.  All included unless contraindicated  PLAN FOR NEXT SESSION: Review HEP knowledge/results.   Per communication with PA on 11/21/2023:  Ambulate in the boot. Can take off for PT if needed.Doesn't have to sleep in boot  Follow up July 10th with Dr. Dixon Fredrickson, PT, DPT, OCS, ATC 11/21/23  3:44 PM

## 2023-12-04 ENCOUNTER — Telehealth: Payer: Self-pay | Admitting: Rehabilitative and Restorative Service Providers"

## 2023-12-04 NOTE — Telephone Encounter (Signed)
 Called patient to offer times for appointments this week (currently patient had no appointments scheduled).  Patient indicated plan to call back today to attempt scheduling.   Bonna Bustard, PT, DPT, OCS, ATC 12/04/23  3:05 PM

## 2023-12-06 ENCOUNTER — Ambulatory Visit (INDEPENDENT_AMBULATORY_CARE_PROVIDER_SITE_OTHER): Admitting: Rehabilitative and Restorative Service Providers"

## 2023-12-06 ENCOUNTER — Encounter: Payer: Self-pay | Admitting: Rehabilitative and Restorative Service Providers"

## 2023-12-06 DIAGNOSIS — M25572 Pain in left ankle and joints of left foot: Secondary | ICD-10-CM

## 2023-12-06 DIAGNOSIS — R262 Difficulty in walking, not elsewhere classified: Secondary | ICD-10-CM

## 2023-12-06 DIAGNOSIS — M6281 Muscle weakness (generalized): Secondary | ICD-10-CM | POA: Diagnosis not present

## 2023-12-06 NOTE — Therapy (Signed)
 OUTPATIENT PHYSICAL THERAPY  TREATMENT   Patient Name: Kelsey Cameron MRN: [id number] DOB:1981-01-24, 43 y.o., female Today's Date: 12/06/2023  END OF SESSION:  PT End of Session - 12/06/23 1324     Visit Number 2    Number of Visits 20    Date for PT Re-Evaluation 01/30/24    Authorization Type [employer]    Progress Note Due on Visit 10    PT Start Time 1307    PT Stop Time 1345    PT Time Calculation (min) 38 min    Activity Tolerance Patient tolerated treatment well    Behavior During Therapy WFL for tasks assessed/performed           Past Medical History:  Diagnosis Date   Hypertension    Macromastia    PONV (postoperative nausea and vomiting)    STD (sexually transmitted disease) 2002   CHALMYDIA   Past Surgical History:  Procedure Laterality Date   BREAST REDUCTION SURGERY  05/17/2011   Procedure: MAMMARY REDUCTION BILATERAL (BREAST);  Surgeon: Mary A Contogiannis;  Location: [facility];  Service: Plastics;  Laterality: Bilateral;   CESAREAN SECTION  09/25/2006   CHOLECYSTECTOMY  06/21/2007   REDUCTION MAMMAPLASTY Bilateral 2012   Patient Active Problem List   Diagnosis Date Noted   BV (bacterial vaginosis) 09/13/2017   Hypertension 11/17/2011   Hypertrophy of breast 05/17/2011   Abdominal pain 06/20/2007   Esophagitis 05/31/2007    PCP: Brena Calk Np  REFERRING PROVIDER: Persons, Norma Beckers, [place]  REFERRING DIAG: [phone] (ICD-10-CM) - Pain in left ankle and joints of left foot  THERAPY DIAG:  Pain in left ankle and joints of left foot  Muscle weakness (generalized)  Difficulty in walking, not elsewhere classified  Rationale for Evaluation and Treatment: Rehabilitation  ONSET DATE: Onset slowly over last 2 years since 2023  SUBJECTIVE:   SUBJECTIVE STATEMENT: Pt. Indicated having complaints increase today with tenderness in area.  Reported walking more yesterday.  Noted increased activity and mornings are more noted for  symptoms.   PERTINENT HISTORY: Unremarkable    PAIN:  NPRS scale:upon arrival 4/10.  Pain location: Posterior Lt ankle achilles insertion  Pain description: hurt, sore, swollen. Pt indicated no specific sharp pain.  Aggravating factors: WB, standing, stairs, exercise.  Relieving factors: resting  PRECAUTIONS: Other: boot wearing (see below)  WEIGHT BEARING RESTRICTIONS: Per communication with PA on 11/21/2023:  Ambulate in the boot. Can take off for PT if needed.Doesn't have to sleep in boot  FALLS:  Has patient fallen in last 6 months? No  LIVING ENVIRONMENT: Lives in: House/apartment Stairs: Flight of stairs to bedroom Has following equipment at home: Boot   OCCUPATION: Customer service with desk based job  PLOF: Independent.  Walking in park/treadmill for exercise (3 miles a day).  Pt reported serving at church on Sundays, yard care for home.   PATIENT GOALS: Reduce pain. Get better.   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  PATIENT SURVEYS:  Patient-Specific Activity Scoring Scheme  0 represents "unable to perform." 10 represents "able to perform at prior level. 0 1 2 3 4 5 6 7 8 9  10 (Date and Score)   Activity Eval  11/21/2023    1. Exercise  3    2. Stairs  5    3. Walking  5   4.    5.    Score 4.33  Total score = sum of the activity scores/number of activities Minimum detectable change (90%CI) for average score = 2 points Minimum detectable change (90%CI) for single activity score = 3 points  COGNITION: 11/21/2023 Overall cognitive status: [facility]    MUSCLE LENGTH: 11/21/2023 No specific testing today  POSTURE:  11/21/2023 Unremarkable   PALPATION: 11/21/2023 Mild tenderness near achilles insertion Lt posterior ankle  LOWER EXTREMITY ROM:   ROM Right Eval 11/21/2023 Left Eval 11/21/2023 Left 12/06/2023  Hip flexion     Hip extension     Hip  abduction     Hip adduction     Hip internal rotation     Hip external rotation     Knee flexion     Knee extension     Ankle dorsiflexion 8 0 c pain 0 in 0 deg knee extension  (5 deg after manual intervention)  Ankle plantarflexion 60 50 c pain 55  Ankle inversion 22 20   Ankle eversion 26 20    (Blank rows = not tested)  LOWER EXTREMITY MMT:  MMT Right Eval 11/21/2023 Left Eval 11/21/2023  Hip flexion    Hip extension    Hip abduction    Hip adduction    Hip internal rotation    Hip external rotation    Knee flexion    Knee extension    Ankle dorsiflexion 5/5 5/5  Ankle plantarflexion 5/5 2+/5  Ankle inversion 5/5 4/5  Ankle eversion 5/5 4/5   (Blank rows = not tested)  LOWER EXTREMITY SPECIAL TESTS:  11/21/2023 No specific testing today  FUNCTIONAL TESTS:  11/21/2023 Lt SLS:  < 5 seconds  Rt SLS: 20 seconds  GAIT: 11/21/2023 Boot on Lt LE in ambulation in clinic.                                                                                                                                                                         TODAY'S TREATMENT                                                                          DATE: 12/06/2023 Therex: Verbal review of existing HEP. Long sitting isometric PF hold in neutral positioning 10 sec x 10 with clinician resistance.  Discussed for home use Long sitting Lt ankle PF eccentric only black band 3 x 10  Nustep lvl 5 UE/LE for ROM  Manual AP g4 glides to talocrural joint for DF mobility gains Lt ankle .  Performed with patient in long sitting. Percussive device to Lt calf for myofascial STM  TODAY'S TREATMENT                                                                          DATE: 11/21/2023 Therex:    HEP instruction/performance c cues for techniques, handout provided.  Trial set performed of each for comprehension and symptom assessment.  See below for exercise list  Manual AP g4 glides to talocrural joint for DF  mobility gains Lt ankle .  Performed with patient in long sitting.   PATIENT EDUCATION:  11/21/2023 Education details: HEP, POC Person educated: Patient Education method: Programmer, multimedia, Demonstration, Verbal cues, and Handouts Education comprehension: verbalized understanding, returned demonstration, and verbal cues required  HOME EXERCISE PROGRAM: Access Code: [email or web address] URL: [email or web address] Date: 11/21/2023 Prepared by: Bonna Bustard  Exercises - Long Sitting Ankle Eversion with Resistance  - 2 x daily - 7 x weekly - 3 sets - 10 reps - Long Sitting Ankle Plantar Flexion with Resistance  - 2 x daily - 7 x weekly - 3 sets - 10 reps - Long Sitting Ankle Inversion with Resistance  - 2 x daily - 7 x weekly - 3 sets - 10 reps - Long Sitting Ankle Dorsiflexion with Anchored Resistance (Mirrored)  - 2 x daily - 7 x weekly - 3 sets - 10 reps - Seated Gastroc Stretch with Strap  - 2-3 x daily - 7 x weekly - 1 sets - 3-5 reps - 30 hold  ASSESSMENT:  CLINICAL IMPRESSION: Continued presentation of complaints in Lt achilles pain symptoms with mobility/strength deficits and limitations in WB in part due to guidelines for ambulation c boot on.  Continued skilled PT services indicated at this time.   OBJECTIVE IMPAIRMENTS: Abnormal gait, decreased activity tolerance, decreased balance, decreased coordination, decreased endurance, decreased mobility, difficulty walking, decreased ROM, decreased strength, hypomobility, increased fascial restrictions, impaired perceived functional ability, increased muscle spasms, impaired flexibility, improper body mechanics, and pain.   ACTIVITY LIMITATIONS: carrying, lifting, bending, standing, stairs, transfers, and locomotion level  PARTICIPATION LIMITATIONS: interpersonal relationship, community activity, and yard work  PERSONAL FACTORS: Time since onset of injury/illness/exacerbation and HTN are also affecting patient's functional outcome.    REHAB POTENTIAL: Good  CLINICAL DECISION MAKING: Stable/uncomplicated  EVALUATION COMPLEXITY: Low   GOALS: Goals reviewed with patient? Yes  SHORT TERM GOALS: (target date for Short term goals are 3 weeks 12/12/2023)   1.  Patient will demonstrate independent use of home exercise program to maintain progress from in clinic treatments.  Goal status: on going 12/06/2023  LONG TERM GOALS: (target dates for all long term goals are 10 weeks  01/30/2024 )   1. Patient will demonstrate/report pain at worst less than or equal to 2/10 to facilitate minimal limitation in daily activity secondary to pain symptoms.  Goal status: New   2. Patient will demonstrate independent use of home exercise program to facilitate ability to maintain/progress functional gains from skilled physical therapy services.  Goal status: New   3. Patient will demonstrate Patient specific functional scale avg > or = 8/10 to indicate reduced disability due to condition.   Goal status: New   4.  Patient will demonstrate Lt  LE MMT 5/5 throughout to faciltiate usual transfers, stairs, squatting at [facility] for daily life.   Goal status: New   5.  Patient will demonstrate Lt ankle AROM WFL s symptoms to facilitate usual mobility in ambulation and other daily activity s limitation.  Goal status: New   6.  Patient will demonstrate independent ambulation s restriction outside of boot when released by MD/ PA. Goal status: New   7.  Patient will demonstrate ascending/descending stairs reciprocally s UE assist for community integration.   Goal Status: New     PLAN:  PT FREQUENCY: 1-2x/week  PT DURATION: 10 weeks  PLANNED INTERVENTIONS: Can include [id number]- PT Re-evaluation, 97110-Therapeutic exercises, 97530- Therapeutic activity, 97112- Neuromuscular re-education, 97535- Self Care, 97140- Manual therapy, [phone]- Gait training, [phone]- Orthotic Fit/training, [phone]- Canalith repositioning, [id number]- Aquatic Therapy,  [phone]- Electrical stimulation (unattended), [phone]- Electrical stimulation (manual), [id number] Physical performance testing, 97016- Vasopneumatic device, [id number]- Ultrasound, [id number]- Traction (mechanical), [id number]- Ionotophoresis 4mg /ml Dexamethasone , Patient/Family education, Balance training, Stair training, Taping, Dry Needling, Joint mobilization, Joint manipulation, Spinal manipulation, Spinal mobilization, Scar mobilization, Vestibular training, Visual/preceptual remediation/compensation, DME instructions, Cryotherapy, and Moist heat.  All performed as medically necessary.  All included unless contraindicated  PLAN FOR NEXT SESSION: Progressive mobility gains,NWB strengthening.     Per communication with PA on 11/21/2023:  Ambulate in the boot. Can take off for PT if needed.Doesn't have to sleep in boot  Follow up July 10th with Dr. Dixon Fredrickson, PT, DPT, OCS, ATC 12/06/23  1:44 PM

## 2023-12-15 ENCOUNTER — Ambulatory Visit (INDEPENDENT_AMBULATORY_CARE_PROVIDER_SITE_OTHER): Admitting: Physical Therapy

## 2023-12-15 ENCOUNTER — Encounter: Payer: Self-pay | Admitting: Physical Therapy

## 2023-12-15 DIAGNOSIS — R262 Difficulty in walking, not elsewhere classified: Secondary | ICD-10-CM | POA: Diagnosis not present

## 2023-12-15 DIAGNOSIS — M6281 Muscle weakness (generalized): Secondary | ICD-10-CM

## 2023-12-15 DIAGNOSIS — M25572 Pain in left ankle and joints of left foot: Secondary | ICD-10-CM | POA: Diagnosis not present

## 2023-12-15 NOTE — Therapy (Signed)
 OUTPATIENT PHYSICAL THERAPY  TREATMENT   Patient Name: Kelsey Cameron MRN: [id number] DOB:1981-02-20, 43 y.o., female Today's Date: 12/15/2023  END OF SESSION:  PT End of Session - 12/15/23 0804     Visit Number 3    Number of Visits 20    Date for PT Re-Evaluation 01/30/24    Authorization Type [employer]    Progress Note Due on Visit 10    PT Start Time 0804    PT Stop Time 0842    PT Time Calculation (min) 38 min    Activity Tolerance Patient tolerated treatment well    Behavior During Therapy WFL for tasks assessed/performed            Past Medical History:  Diagnosis Date   Hypertension    Macromastia    PONV (postoperative nausea and vomiting)    STD (sexually transmitted disease) 2002   CHALMYDIA   Past Surgical History:  Procedure Laterality Date   BREAST REDUCTION SURGERY  05/17/2011   Procedure: MAMMARY REDUCTION BILATERAL (BREAST);  Surgeon: Mary A Contogiannis;  Location: [facility];  Service: Plastics;  Laterality: Bilateral;   CESAREAN SECTION  09/25/2006   CHOLECYSTECTOMY  06/21/2007   REDUCTION MAMMAPLASTY Bilateral 2012   Patient Active Problem List   Diagnosis Date Noted   BV (bacterial vaginosis) 09/13/2017   Hypertension 11/17/2011   Hypertrophy of breast 05/17/2011   Abdominal pain 06/20/2007   Esophagitis 05/31/2007    PCP: Tonye Huxley Np  REFERRING PROVIDER: Persons, Ronal Dragon, [place]  REFERRING DIAG: [phone] (ICD-10-CM) - Pain in left ankle and joints of left foot  THERAPY DIAG:  Pain in left ankle and joints of left foot  Muscle weakness (generalized)  Difficulty in walking, not elsewhere classified  Rationale for Evaluation and Treatment: Rehabilitation  ONSET DATE: Onset slowly over last 2 years since 2023  SUBJECTIVE:   SUBJECTIVE STATEMENT: I'm good this morning. Pt indicates pain ebbs and flows. Still keeps foot elevated throughout the day. Reports exercises have been doing pretty good.   PERTINENT  HISTORY: Unremarkable    PAIN:  NPRS scale:upon arrival 4/10.  Pain location: Posterior Lt ankle achilles insertion  Pain description: hurt, sore, swollen. Pt indicated no specific sharp pain.  Aggravating factors: WB, standing, stairs, exercise.  Relieving factors: resting  PRECAUTIONS: Other: boot wearing (see below)  WEIGHT BEARING RESTRICTIONS: Per communication with PA on 11/21/2023:  Ambulate in the boot. Can take off for PT if needed.Doesn't have to sleep in boot  FALLS:  Has patient fallen in last 6 months? No  LIVING ENVIRONMENT: Lives in: House/apartment Stairs: Flight of stairs to bedroom Has following equipment at home: Boot   OCCUPATION: Customer service with desk based job  PLOF: Independent.  Walking in park/treadmill for exercise (3 miles a day).  Pt reported serving at church on Sundays, yard care for home.   PATIENT GOALS: Reduce pain. Get better.   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  PATIENT SURVEYS:  Patient-Specific Activity Scoring Scheme  0 represents "unable to perform." 10 represents "able to perform at prior level. 0 1 2 3 4 5 6 7 8 9  10 (Date and Score)   Activity Eval  11/21/2023    1. Exercise  3    2. Stairs  5    3. Walking  5   4.    5.    Score 4.33    Total  score = sum of the activity scores/number of activities Minimum detectable change (90%CI) for average score = 2 points Minimum detectable change (90%CI) for single activity score = 3 points  COGNITION: 11/21/2023 Overall cognitive status: [facility]    MUSCLE LENGTH: 11/21/2023 No specific testing today  POSTURE:  11/21/2023 Unremarkable   PALPATION: 11/21/2023 Mild tenderness near achilles insertion Lt posterior ankle  LOWER EXTREMITY ROM:   ROM Right Eval 11/21/2023 Left Eval 11/21/2023 Left 12/06/2023  Hip flexion     Hip extension     Hip abduction     Hip  adduction     Hip internal rotation     Hip external rotation     Knee flexion     Knee extension     Ankle dorsiflexion 8 0 c pain 0 in 0 deg knee extension  (5 deg after manual intervention)  Ankle plantarflexion 60 50 c pain 55  Ankle inversion 22 20   Ankle eversion 26 20    (Blank rows = not tested)  LOWER EXTREMITY MMT:  MMT Right Eval 11/21/2023 Left Eval 11/21/2023  Hip flexion    Hip extension    Hip abduction    Hip adduction    Hip internal rotation    Hip external rotation    Knee flexion    Knee extension    Ankle dorsiflexion 5/5 5/5  Ankle plantarflexion 5/5 2+/5  Ankle inversion 5/5 4/5  Ankle eversion 5/5 4/5   (Blank rows = not tested)  LOWER EXTREMITY SPECIAL TESTS:  11/21/2023 No specific testing today  FUNCTIONAL TESTS:  11/21/2023 Lt SLS:  < 5 seconds  Rt SLS: 20 seconds  GAIT: 11/21/2023 Boot on Lt LE in ambulation in clinic.                                                                                                                                                                        TODAY'S TREATMENT                                                                          DATE: 12/15/2023 Therex: Seated ankle PF stretch with strap x30 knee straight Seated ankle PF stretch with strap x30 knee bent Seated hamstring stretch x 30 Seated towel scrunch x20 Seated toe yoga 2x10 Seated ankle ev/inv towel 2x10 Seated ankle PF eccentric black band 2x10 knee straight and then knee bent x10 Seated ankle circles CW & CCW x10 each  Manual  Massage roller L calf Percussive device to Lt calf for myofascial STM STM & TPR L gastroc/soleus Grade III subtalar joint distraction, PAs for DF   TODAY'S TREATMENT                                                                          DATE: 12/06/2023 Therex: Verbal review of existing HEP. Long sitting isometric PF hold in neutral positioning 10 sec x 10 with clinician resistance.  Discussed for home  use Long sitting Lt ankle PF eccentric only black band 3 x 10  Nustep lvl 5 UE/LE for ROM  Manual AP g4 glides to talocrural joint for DF mobility gains Lt ankle .  Performed with patient in long sitting. Percussive device to Lt calf for myofascial STM  TODAY'S TREATMENT                                                                          DATE: 11/21/2023 Therex:    HEP instruction/performance c cues for techniques, handout provided.  Trial set performed of each for comprehension and symptom assessment.  See below for exercise list  Manual AP g4 glides to talocrural joint for DF mobility gains Lt ankle .  Performed with patient in long sitting.   PATIENT EDUCATION:  11/21/2023 Education details: HEP, POC Person educated: Patient Education method: Programmer, multimedia, Demonstration, Verbal cues, and Handouts Education comprehension: verbalized understanding, returned demonstration, and verbal cues required  HOME EXERCISE PROGRAM: Access Code: [email or web address] URL: [email or web address] Date: 11/21/2023 Prepared by: Ozell Silvan  Exercises - Long Sitting Ankle Eversion with Resistance  - 2 x daily - 7 x weekly - 3 sets - 10 reps - Long Sitting Ankle Plantar Flexion with Resistance  - 2 x daily - 7 x weekly - 3 sets - 10 reps - Long Sitting Ankle Inversion with Resistance  - 2 x daily - 7 x weekly - 3 sets - 10 reps - Long Sitting Ankle Dorsiflexion with Anchored Resistance (Mirrored)  - 2 x daily - 7 x weekly - 3 sets - 10 reps - Seated Gastroc Stretch with Strap  - 2-3 x daily - 7 x weekly - 1 sets - 3-5 reps - 30 hold  ASSESSMENT:  CLINICAL IMPRESSION: Initiated foot/toe strengthening this session. Added soleus eccentrics with knee bent. Continued manual work to improve calf muscle extensibility and joint mobility.   OBJECTIVE IMPAIRMENTS: Abnormal gait, decreased activity tolerance, decreased balance, decreased coordination, decreased endurance, decreased mobility, difficulty  walking, decreased ROM, decreased strength, hypomobility, increased fascial restrictions, impaired perceived functional ability, increased muscle spasms, impaired flexibility, improper body mechanics, and pain.    GOALS: Goals reviewed with patient? Yes  SHORT TERM GOALS: (target date for Short term goals are 3 weeks 12/12/2023)   1.  Patient will demonstrate independent use of home exercise program to maintain progress from in clinic treatments.  Goal status: on going 12/06/2023  LONG TERM GOALS: (target dates for all long term  goals are 10 weeks  01/30/2024 )   1. Patient will demonstrate/report pain at worst less than or equal to 2/10 to facilitate minimal limitation in daily activity secondary to pain symptoms.  Goal status: New   2. Patient will demonstrate independent use of home exercise program to facilitate ability to maintain/progress functional gains from skilled physical therapy services.  Goal status: New   3. Patient will demonstrate Patient specific functional scale avg > or = 8/10 to indicate reduced disability due to condition.   Goal status: New   4.  Patient will demonstrate Lt LE MMT 5/5 throughout to faciltiate usual transfers, stairs, squatting at [facility] for daily life.   Goal status: New   5.  Patient will demonstrate Lt ankle AROM WFL s symptoms to facilitate usual mobility in ambulation and other daily activity s limitation.  Goal status: New   6.  Patient will demonstrate independent ambulation s restriction outside of boot when released by MD/ PA. Goal status: New   7.  Patient will demonstrate ascending/descending stairs reciprocally s UE assist for community integration.   Goal Status: New     PLAN:  PT FREQUENCY: 1-2x/week  PT DURATION: 10 weeks  PLANNED INTERVENTIONS: Can include 02853- PT Re-evaluation, 97110-Therapeutic exercises, 97530- Therapeutic activity, 97112- Neuromuscular re-education, 97535- Self Care, 97140- Manual therapy, [phone]-  Gait training, [phone]- Orthotic Fit/training, [phone]- Canalith repositioning, [id number]- Aquatic Therapy, [phone]- Electrical stimulation (unattended), [phone]- Electrical stimulation (manual), [id number] Physical performance testing, 97016- Vasopneumatic device, [id number]- Ultrasound, [id number]- Traction (mechanical), [id number]- Ionotophoresis 4mg /ml Dexamethasone , Patient/Family education, Balance training, Stair training, Taping, Dry Needling, Joint mobilization, Joint manipulation, Spinal manipulation, Spinal mobilization, Scar mobilization, Vestibular training, Visual/preceptual remediation/compensation, DME instructions, Cryotherapy, and Moist heat.  All performed as medically necessary.  All included unless contraindicated  PLAN FOR NEXT SESSION: Progressive mobility gains,NWB strengthening.     Per communication with PA on 11/21/2023:  Ambulate in the boot. Can take off for PT if needed.Doesn't have to sleep in boot  Follow up July 10th with Dr. Duda   Shakima Nisley April Ma L Kalianne Fetting, PT, DPT 12/15/23  8:05 AM

## 2023-12-21 ENCOUNTER — Encounter: Payer: Self-pay | Admitting: Physical Therapy

## 2023-12-21 ENCOUNTER — Ambulatory Visit: Admitting: Physical Therapy

## 2023-12-21 DIAGNOSIS — R262 Difficulty in walking, not elsewhere classified: Secondary | ICD-10-CM | POA: Diagnosis not present

## 2023-12-21 DIAGNOSIS — M6281 Muscle weakness (generalized): Secondary | ICD-10-CM

## 2023-12-21 DIAGNOSIS — M25572 Pain in left ankle and joints of left foot: Secondary | ICD-10-CM | POA: Diagnosis not present

## 2023-12-21 NOTE — Therapy (Signed)
 OUTPATIENT PHYSICAL THERAPY  TREATMENT   Patient Name: Kelsey Cameron MRN: [id number] DOB:04-05-1981, 43 y.o., female Today's Date: 12/21/2023  END OF SESSION:  PT End of Session - 12/21/23 0938     Visit Number 4    Number of Visits 20    Date for PT Re-Evaluation 01/30/24    Authorization Type [employer]    Progress Note Due on Visit 10    PT Start Time [phone]   pt arrived late   PT Stop Time 1011    PT Time Calculation (min) 33 min    Activity Tolerance Patient tolerated treatment well    Behavior During Therapy WFL for tasks assessed/performed             Past Medical History:  Diagnosis Date   Hypertension    Macromastia    PONV (postoperative nausea and vomiting)    STD (sexually transmitted disease) 2002   CHALMYDIA   Past Surgical History:  Procedure Laterality Date   BREAST REDUCTION SURGERY  05/17/2011   Procedure: MAMMARY REDUCTION BILATERAL (BREAST);  Surgeon: Mary A Contogiannis;  Location: [facility];  Service: Plastics;  Laterality: Bilateral;   CESAREAN SECTION  09/25/2006   CHOLECYSTECTOMY  06/21/2007   REDUCTION MAMMAPLASTY Bilateral 2012   Patient Active Problem List   Diagnosis Date Noted   BV (bacterial vaginosis) 09/13/2017   Hypertension 11/17/2011   Hypertrophy of breast 05/17/2011   Abdominal pain 06/20/2007   Esophagitis 05/31/2007    PCP: Tonye Huxley Np  REFERRING PROVIDER: Persons, Ronal Dragon, [place]  REFERRING DIAG: [phone] (ICD-10-CM) - Pain in left ankle and joints of left foot  THERAPY DIAG:  Pain in left ankle and joints of left foot  Muscle weakness (generalized)  Difficulty in walking, not elsewhere classified  Rationale for Evaluation and Treatment: Rehabilitation  ONSET DATE: Onset slowly over last 2 years since 2023  SUBJECTIVE:   SUBJECTIVE STATEMENT: Was having a lot of pain yesterday but better today  PERTINENT HISTORY: Unremarkable    PAIN:  NPRS scale:upon arrival 0/10.  Pain location:  Posterior Lt ankle achilles insertion  Pain description: hurt, sore, swollen. Pt indicated no specific sharp pain.  Aggravating factors: WB, standing, stairs, exercise.  Relieving factors: resting  PRECAUTIONS: Other: boot wearing (see below)  WEIGHT BEARING RESTRICTIONS: Per communication with PA on 11/21/2023:  Ambulate in the boot. Can take off for PT if needed.Doesn't have to sleep in boot  FALLS:  Has patient fallen in last 6 months? No  LIVING ENVIRONMENT: Lives in: House/apartment Stairs: Flight of stairs to bedroom Has following equipment at home: Boot   OCCUPATION: Customer service with desk based job  PLOF: Independent.  Walking in park/treadmill for exercise (3 miles a day).  Pt reported serving at church on Sundays, yard care for home.   PATIENT GOALS: Reduce pain. Get better.   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  PATIENT SURVEYS:  Patient-Specific Activity Scoring Scheme  0 represents "unable to perform." 10 represents "able to perform at prior level. 0 1 2 3 4 5 6 7 8 9  10 (Date and Score)   Activity Eval  11/21/2023    1. Exercise  3    2. Stairs  5    3. Walking  5   Score 4.33    Total score = sum of the activity scores/number of activities Minimum detectable change (90%CI) for average score = 2  points Minimum detectable change (90%CI) for single activity score = 3 points  COGNITION: 11/21/2023 Overall cognitive status: [facility]    MUSCLE LENGTH: 11/21/2023 No specific testing today  POSTURE:  11/21/2023 Unremarkable   PALPATION: 11/21/2023 Mild tenderness near achilles insertion Lt posterior ankle  LOWER EXTREMITY ROM:   ROM Right Eval 11/21/2023 Left Eval 11/21/2023 Left 12/06/2023  Ankle dorsiflexion 8 0 c pain 0 in 0 deg knee extension  (5 deg after manual intervention)  Ankle plantarflexion 60 50 c pain 55  Ankle inversion 22 20   Ankle  eversion 26 20    (Blank rows = not tested)  LOWER EXTREMITY MMT:  MMT Right Eval 11/21/2023 Left Eval 11/21/2023  Hip flexion    Hip extension    Hip abduction    Hip adduction    Hip internal rotation    Hip external rotation    Knee flexion    Knee extension    Ankle dorsiflexion 5/5 5/5  Ankle plantarflexion 5/5 2+/5  Ankle inversion 5/5 4/5  Ankle eversion 5/5 4/5   (Blank rows = not tested)  LOWER EXTREMITY SPECIAL TESTS:  11/21/2023 No specific testing today  FUNCTIONAL TESTS:  11/21/2023 Lt SLS:  < 5 seconds  Rt SLS: 20 seconds  GAIT: 11/21/2023 Boot on Lt LE in ambulation in clinic.                                                                                                                                                                         TODAY'S TREATMENT 12/21/23 TherEx Seated ankle PF stretch with strap 3x30 knee straight Seated ankle PF stretch with strap 3x30 knee bent Seated hamstring stretch 3 x30 sec Seated arch lifts 20 x 5 sec hold Ankle alphabet upper/lower case x 1 cycle each Seated BAPS board with (1/2# weight AM) ant/post taps x 20 reps; medial/lateral taps x 20 reps, circles CW/CCW x 10 reps each  Discussed considering acetic acid iontophoresis as an option and what she would need to purchase if she would like to try.     12/15/2023 Therex: Seated ankle PF stretch with strap x30 knee straight Seated ankle PF stretch with strap x30 knee bent Seated hamstring stretch x 30 Seated towel scrunch x20 Seated toe yoga 2x10 Seated ankle ev/inv towel 2x10 Seated ankle PF eccentric black band 2x10 knee straight and then knee bent x10 Seated ankle circles CW & CCW x10 each  Manual Massage roller L calf Percussive device to Lt calf for myofascial STM STM & TPR L gastroc/soleus Grade III subtalar joint distraction, PAs for DF   12/06/2023 Therex: Verbal review of existing HEP. Long sitting isometric PF hold in neutral positioning 10  sec x 10 with clinician resistance.  Discussed for home use Long sitting Lt ankle PF eccentric only black band 3 x 10  Nustep lvl 5 UE/LE for ROM  Manual AP g4 glides to talocrural joint for DF mobility gains Lt ankle .  Performed with patient in long sitting. Percussive device to Lt calf for myofascial STM  11/21/2023 Therex:    HEP instruction/performance c cues for techniques, handout provided.  Trial set performed of each for comprehension and symptom assessment.  See below for exercise list  Manual AP g4 glides to talocrural joint for DF mobility gains Lt ankle .  Performed with patient in long sitting.   PATIENT EDUCATION:  11/21/2023 Education details: HEP, POC Person educated: Patient Education method: Programmer, multimedia, Demonstration, Verbal cues, and Handouts Education comprehension: verbalized understanding, returned demonstration, and verbal cues required  HOME EXERCISE PROGRAM: Access Code: [email or web address] URL: [email or web address] Date: 11/21/2023 Prepared by: Ozell Silvan  Exercises - Long Sitting Ankle Eversion with Resistance  - 2 x daily - 7 x weekly - 3 sets - 10 reps - Long Sitting Ankle Plantar Flexion with Resistance  - 2 x daily - 7 x weekly - 3 sets - 10 reps - Long Sitting Ankle Inversion with Resistance  - 2 x daily - 7 x weekly - 3 sets - 10 reps - Long Sitting Ankle Dorsiflexion with Anchored Resistance (Mirrored)  - 2 x daily - 7 x weekly - 3 sets - 10 reps - Seated Gastroc Stretch with Strap  - 2-3 x daily - 7 x weekly - 1 sets - 3-5 reps - 30 hold  ASSESSMENT:  CLINICAL IMPRESSION: Pt tolerated session well today, will follow up with MD next week to discuss current options.  Will continue to benefit from PT to maximize function.  OBJECTIVE IMPAIRMENTS: Abnormal gait, decreased activity tolerance, decreased balance, decreased coordination, decreased endurance, decreased mobility, difficulty walking, decreased ROM, decreased strength, hypomobility,  increased fascial restrictions, impaired perceived functional ability, increased muscle spasms, impaired flexibility, improper body mechanics, and pain.    GOALS: Goals reviewed with patient? Yes  SHORT TERM GOALS: (target date for Short term goals are 3 weeks 12/12/2023)   1.  Patient will demonstrate independent use of home exercise program to maintain progress from in clinic treatments.  Goal status: on going 12/06/2023  LONG TERM GOALS: (target dates for all long term goals are 10 weeks  01/30/2024 )   1. Patient will demonstrate/report pain at worst less than or equal to 2/10 to facilitate minimal limitation in daily activity secondary to pain symptoms.  Goal status: New   2. Patient will demonstrate independent use of home exercise program to facilitate ability to maintain/progress functional gains from skilled physical therapy services.  Goal status: New   3. Patient will demonstrate Patient specific functional scale avg > or = 8/10 to indicate reduced disability due to condition.   Goal status: New   4.  Patient will demonstrate Lt LE MMT 5/5 throughout to faciltiate usual transfers, stairs, squatting at [facility] for daily life.   Goal status: New   5.  Patient will demonstrate Lt ankle AROM WFL s symptoms to facilitate usual mobility in ambulation and other daily activity s limitation.  Goal status: New   6.  Patient will demonstrate independent ambulation s restriction outside of boot when released by MD/ PA. Goal status: New   7.  Patient will demonstrate ascending/descending stairs reciprocally s UE assist for community integration.   Goal Status: New     PLAN:  PT FREQUENCY:  1-2x/week  PT DURATION: 10 weeks  PLANNED INTERVENTIONS: Can include 02853- PT Re-evaluation, 97110-Therapeutic exercises, 97530- Therapeutic activity, 97112- Neuromuscular re-education, 97535- Self Care, 97140- Manual therapy, [phone]- Gait training, [phone]- Orthotic Fit/training, [phone]-  Canalith repositioning, [id number]- Aquatic Therapy, [phone]- Electrical stimulation (unattended), [phone]- Electrical stimulation (manual), [id number] Physical performance testing, 97016- Vasopneumatic device, [id number]- Ultrasound, [id number]- Traction (mechanical), [id number]- Ionotophoresis 4mg /ml Dexamethasone , Patient/Family education, Balance training, Stair training, Taping, Dry Needling, Joint mobilization, Joint manipulation, Spinal manipulation, Spinal mobilization, Scar mobilization, Vestibular training, Visual/preceptual remediation/compensation, DME instructions, Cryotherapy, and Moist heat.  All performed as medically necessary.  All included unless contraindicated  PLAN FOR NEXT SESSION: see what MD says,  Progressive mobility gains,NWB strengthening.     Per communication with PA on 11/21/2023:  Ambulate in the boot. Can take off for PT if needed.Doesn't have to sleep in boot  Follow up July 10th with Dr. Harden Corean JULIANNA Alvia, PT, DPT 12/21/23 10:15 AM

## 2023-12-26 ENCOUNTER — Encounter: Admitting: Physical Therapy

## 2023-12-28 ENCOUNTER — Ambulatory Visit: Admitting: Physician Assistant

## 2023-12-28 ENCOUNTER — Encounter: Payer: Self-pay | Admitting: Adult Health

## 2023-12-28 ENCOUNTER — Encounter: Payer: Self-pay | Admitting: Physician Assistant

## 2023-12-28 DIAGNOSIS — M7662 Achilles tendinitis, left leg: Secondary | ICD-10-CM

## 2023-12-28 DIAGNOSIS — I10 Essential (primary) hypertension: Secondary | ICD-10-CM

## 2023-12-28 NOTE — Progress Notes (Signed)
 Office Visit Note   Patient: Kelsey Cameron           Date of Birth: [birth year]           MRN: [id number] Visit Date: 12/28/2023              Requested by: Merna Huxley, NP [address] [place],  [place] [address] PCP: Merna Huxley, NP  Chief Complaint  Patient presents with   Left Ankle - Follow-up      HPI: 43 y/o female with a history of left posterior achillis pain.  She was placed in a cam boot with a heel lift to protect the achillis when she was seen on 11/17/23 and has been attending PT for stretches and strengthening.  She states her pain has improved some.  PT is recommending iontophoresis in addition to therapy.    Assessment & Plan: Visit Diagnoses:  1. Achilles tendinitis, left leg     Plan: added Iontophoresis to PT plan and isometrics.  She can discontinue the cam boot and move the heel lift into a hard sole shoe.    Follow-Up Instructions: Return in about 4 weeks (around 01/25/2024).   Ortho Exam  Patient is alert, oriented, no adenopathy, well-dressed, normal affect, normal respiratory effort. Achillis -10 degrees of dorsiflexion left ankle.  Good plantarflexion, inversion/eversion intact.  Tenderness over the calcaneous posteriorly.  No cellulitis or erythema.  Palpable DP pulse.      Imaging: Reviewed previous x ray Joint is well maintained, posterior calcaneous spurring at the Achilles insertion.    Labs: Lab Results  Component Value Date   HGBA1C 5.9 05/07/2021   LABORGA NO GROWTH 07/09/2015     Lab Results  Component Value Date   ALBUMIN 4.2 12/28/2022   ALBUMIN 4.2 05/07/2021    No results found for: MG No results found for: VD25OH  No results found for: PREALBUMIN    Latest Ref Rng & Units 12/28/2022   10:16 AM 05/07/2021   11:14 AM 11/08/2019    4:04 PM  CBC EXTENDED  WBC 4.0 - 10.5 K/uL 9.0  9.5  10.3   RBC 3.87 - 5.11 Mil/uL 4.31  4.60  4.39   Hemoglobin 12.0 - 15.0 g/dL 86.1  85.1  85.9   HCT 36.0 - 46.0  % 40.8  43.8  42.3   Platelets 150.0 - 400.0 K/uL 208.0  178.0  195   NEUT# 1.4 - 7.7 K/uL 5.5  6.9  6,262   Lymph# 0.7 - 4.0 K/uL 2.6  2.0  2,977      There is no height or weight on file to calculate BMI.  Orders:  No orders of the defined types were placed in this encounter.  No orders of the defined types were placed in this encounter.    Procedures: No procedures performed  Clinical Data: No additional findings.  ROS:  All other systems negative, except as noted in the HPI. Review of Systems  Objective: Vital Signs: There were no vitals taken for this visit.  Specialty Comments:  No specialty comments available.  PMFS History: Patient Active Problem List   Diagnosis Date Noted   BV (bacterial vaginosis) 09/13/2017   Hypertension 11/17/2011   Hypertrophy of breast 05/17/2011   Abdominal pain 06/20/2007   Esophagitis 05/31/2007   Past Medical History:  Diagnosis Date   Hypertension    Macromastia    PONV (postoperative nausea and vomiting)    STD (sexually transmitted disease) 2002   CHALMYDIA  Family History  Problem Relation Age of Onset   Aneurysm Mother    Hypertension Mother    Breast cancer Mother 7   Aneurysm Father    Hypertension Father    Heart disease Father    Breast cancer Maternal Aunt    Breast cancer Maternal Grandmother    Cancer Other    Pancreatic cancer Cousin     Past Surgical History:  Procedure Laterality Date   BREAST REDUCTION SURGERY  05/17/2011   Procedure: MAMMARY REDUCTION BILATERAL (BREAST);  Surgeon: Mary A Contogiannis;  Location: [facility];  Service: Plastics;  Laterality: Bilateral;   CESAREAN SECTION  09/25/2006   CHOLECYSTECTOMY  06/21/2007   REDUCTION MAMMAPLASTY Bilateral 2012   Social History   Occupational History   Not on file  Tobacco Use   Smoking status: Former    Types: Cigarettes    Passive exposure: Never   Smokeless tobacco: Never   Tobacco comments:    quit smoking  10/2010  Vaping Use   Vaping status: Never Used  Substance and Sexual Activity   Alcohol use: Yes    Comment: rare, once every 6 months   Drug use: No   Sexual activity: Yes    Partners: Male    Birth control/protection: I.U.D.    Comment: INTERCOUSRE AGE 46,SEXUAL PARTNERS MORE THAN 5 , menarche 10yo, sexual debut 43yo

## 2023-12-29 ENCOUNTER — Other Ambulatory Visit: Payer: Self-pay | Admitting: Adult Health

## 2023-12-29 DIAGNOSIS — I10 Essential (primary) hypertension: Secondary | ICD-10-CM

## 2023-12-29 MED ORDER — BENAZEPRIL-HYDROCHLOROTHIAZIDE 20-25 MG PO TABS: 1.0000 | ORAL_TABLET | Freq: Every day | ORAL | 0 refills | Status: AC

## 2023-12-29 MED ORDER — AMLODIPINE BESYLATE 5 MG PO TABS: 5.0000 mg | ORAL_TABLET | Freq: Every day | ORAL | 0 refills | Status: AC

## 2023-12-29 MED ORDER — BENAZEPRIL HCL 20 MG PO TABS: ORAL_TABLET | ORAL | 3 refills | Status: AC

## 2024-01-03 ENCOUNTER — Encounter: Admitting: Rehabilitative and Restorative Service Providers"

## 2024-01-18 ENCOUNTER — Encounter: Payer: Self-pay | Admitting: Physical Therapy

## 2024-01-25 ENCOUNTER — Ambulatory Visit: Admitting: Orthopedic Surgery

## 2024-01-25 DIAGNOSIS — M7662 Achilles tendinitis, left leg: Secondary | ICD-10-CM | POA: Diagnosis not present

## 2024-01-31 ENCOUNTER — Encounter: Payer: Self-pay | Admitting: Orthopedic Surgery

## 2024-01-31 NOTE — Progress Notes (Signed)
 Office Visit Note   Patient: Kelsey Cameron           Date of Birth: [birth year]           MRN: [id number] Visit Date: 01/25/2024              Requested by: Merna Huxley, NP [address] [place],  [place] [address] PCP: Merna Huxley, NP  Chief Complaint  Patient presents with   Left Leg - Follow-up      HPI: Patient is a 43 year old woman with left Achilles tendinitis.  She has used a heel lift that has improved her symptoms.  Assessment & Plan: Visit Diagnoses:  1. Achilles tendinitis, left leg     Plan: Recommend that she advance to a heel lift in her shoe and wean out of the heel lift.  Continue with Achilles stretching.  Follow-Up Instructions: Return if symptoms worsen or fail to improve.   Ortho Exam  Patient is alert, oriented, no adenopathy, well-dressed, normal affect, normal respiratory effort. Examination there is no tenderness to palpation along the Achilles.  She has good range of motion of the ankle and subtalar joint there is no swelling.    Imaging: No results found. No images are attached to the encounter.  Labs: Lab Results  Component Value Date   HGBA1C 5.9 05/07/2021   LABORGA NO GROWTH 07/09/2015     Lab Results  Component Value Date   ALBUMIN 4.2 12/28/2022   ALBUMIN 4.2 05/07/2021    No results found for: MG No results found for: VD25OH  No results found for: PREALBUMIN    Latest Ref Rng & Units 12/28/2022   10:16 AM 05/07/2021   11:14 AM 11/08/2019    4:04 PM  CBC EXTENDED  WBC 4.0 - 10.5 K/uL 9.0  9.5  10.3   RBC 3.87 - 5.11 Mil/uL 4.31  4.60  4.39   Hemoglobin 12.0 - 15.0 g/dL 86.1  85.1  85.9   HCT 36.0 - 46.0 % 40.8  43.8  42.3   Platelets 150.0 - 400.0 K/uL 208.0  178.0  195   NEUT# 1.4 - 7.7 K/uL 5.5  6.9  6,262   Lymph# 0.7 - 4.0 K/uL 2.6  2.0  2,977      There is no height or weight on file to calculate BMI.  Orders:  No orders of the defined types were placed in this encounter.  No  orders of the defined types were placed in this encounter.    Procedures: No procedures performed  Clinical Data: No additional findings.  ROS:  All other systems negative, except as noted in the HPI. Review of Systems  Objective: Vital Signs: There were no vitals taken for this visit.  Specialty Comments:  No specialty comments available.  PMFS History: Patient Active Problem List   Diagnosis Date Noted   BV (bacterial vaginosis) 09/13/2017   Hypertension 11/17/2011   Hypertrophy of breast 05/17/2011   Abdominal pain 06/20/2007   Esophagitis 05/31/2007   Past Medical History:  Diagnosis Date   Hypertension    Macromastia    PONV (postoperative nausea and vomiting)    STD (sexually transmitted disease) 2002   CHALMYDIA    Family History  Problem Relation Age of Onset   Aneurysm Mother    Hypertension Mother    Breast cancer Mother 3   Aneurysm Father    Hypertension Father    Heart disease Father    Breast cancer Maternal Aunt  Breast cancer Maternal Grandmother    Cancer Other    Pancreatic cancer Cousin     Past Surgical History:  Procedure Laterality Date   BREAST REDUCTION SURGERY  05/17/2011   Procedure: MAMMARY REDUCTION BILATERAL (BREAST);  Surgeon: Mary A Contogiannis;  Location: [facility];  Service: Plastics;  Laterality: Bilateral;   CESAREAN SECTION  09/25/2006   CHOLECYSTECTOMY  06/21/2007   REDUCTION MAMMAPLASTY Bilateral 2012   Social History   Occupational History   Not on file  Tobacco Use   Smoking status: Former    Types: Cigarettes    Passive exposure: Never   Smokeless tobacco: Never   Tobacco comments:    quit smoking 10/2010  Vaping Use   Vaping status: Never Used  Substance and Sexual Activity   Alcohol use: Yes    Comment: rare, once every 6 months   Drug use: No   Sexual activity: Yes    Partners: Male    Birth control/protection: I.U.D.    Comment: INTERCOUSRE AGE 67,SEXUAL PARTNERS MORE THAN 5 ,  menarche 10yo, sexual debut 43yo

## 2024-02-01 LAB — COLOGUARD: COLOGUARD: POSITIVE — AB

## 2024-02-02 ENCOUNTER — Ambulatory Visit: Payer: Self-pay | Admitting: Radiology

## 2024-02-02 DIAGNOSIS — R195 Other fecal abnormalities: Secondary | ICD-10-CM

## 2024-02-02 NOTE — Telephone Encounter (Signed)
See result note. Encounter closed.

## 2024-04-22 ENCOUNTER — Encounter: Payer: Self-pay | Admitting: Radiology

## 2024-04-30 ENCOUNTER — Encounter: Payer: Self-pay | Admitting: Internal Medicine

## 2024-09-20 ENCOUNTER — Ambulatory Visit: Admitting: Radiology
